# Patient Record
Sex: Female | Born: 2000 | Race: White | Marital: Married
Health system: Southern US, Community
[De-identification: ages and names within clinical notes are randomized; demographics above are authoritative.]

## PROBLEM LIST (undated history)

## (undated) ENCOUNTER — Inpatient Hospital Stay (HOSPITAL_COMMUNITY): Payer: Self-pay

## (undated) DIAGNOSIS — R200 Anesthesia of skin: Secondary | ICD-10-CM

## (undated) DIAGNOSIS — J45909 Unspecified asthma, uncomplicated: Secondary | ICD-10-CM

## (undated) DIAGNOSIS — R202 Paresthesia of skin: Secondary | ICD-10-CM

## (undated) DIAGNOSIS — M357 Hypermobility syndrome: Secondary | ICD-10-CM

## (undated) DIAGNOSIS — M797 Fibromyalgia: Secondary | ICD-10-CM

## (undated) HISTORY — DX: Anesthesia of skin: R20.2

## (undated) HISTORY — DX: Fibromyalgia: M79.7

## (undated) HISTORY — DX: Anesthesia of skin: R20.0

## (undated) HISTORY — DX: Unspecified asthma, uncomplicated: J45.909

## (undated) HISTORY — DX: Hypermobility syndrome: M35.7

## (undated) HISTORY — PX: WISDOM TOOTH EXTRACTION: SHX21

---
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
## 2018-02-10 HISTORY — PX: WISDOM TOOTH EXTRACTION: SHX21

## 2018-03-12 ENCOUNTER — Encounter: Payer: Self-pay | Admitting: Family Medicine

## 2018-03-12 ENCOUNTER — Ambulatory Visit (INDEPENDENT_AMBULATORY_CARE_PROVIDER_SITE_OTHER): Payer: BLUE CROSS/BLUE SHIELD | Admitting: Family Medicine

## 2018-03-12 VITALS — BP 134/82 | HR 104 | Ht 61.0 in | Wt 232.2 lb

## 2018-03-12 DIAGNOSIS — M159 Polyosteoarthritis, unspecified: Secondary | ICD-10-CM | POA: Diagnosis not present

## 2018-03-12 DIAGNOSIS — M797 Fibromyalgia: Secondary | ICD-10-CM | POA: Insufficient documentation

## 2018-03-12 DIAGNOSIS — M62838 Other muscle spasm: Secondary | ICD-10-CM | POA: Insufficient documentation

## 2018-03-12 DIAGNOSIS — M2550 Pain in unspecified joint: Secondary | ICD-10-CM | POA: Insufficient documentation

## 2018-03-12 DIAGNOSIS — Z6841 Body Mass Index (BMI) 40.0 and over, adult: Secondary | ICD-10-CM | POA: Insufficient documentation

## 2018-03-12 MED ORDER — CYCLOBENZAPRINE HCL 10 MG PO TABS: 10.0000 mg | ORAL_TABLET | Freq: Three times a day (TID) | ORAL | 0 refills | Status: DC | PRN

## 2018-03-12 MED ORDER — NAPROXEN 500 MG PO TABS
ORAL_TABLET | ORAL | 0 refills | Status: DC
Start: 2018-03-12 — End: 2018-07-02

## 2018-03-12 NOTE — Progress Notes (Signed)
Pt here for an acute care OV today   Impression and Recommendations:    1. Muscle spasm-upper trap   2. Fibromyalgia   3. Generalized OA   4. BMI 40.0-44.9, adult ([name])     1. Muscle spasm-upper trap -Start flexeril and naproxen PRN.  -use ice cup massage and apply this to the area. Do this 15-20 minutes, 3-4 times a day. 2. Fibromyalgia/generalized OA  -Take OTC meds for pain.  3. BMI 40-44.9 -discussed the importance of walking and exercising regularly. Start off with 5-10 minutes, then slowly increase this over time.  -counseled pt extensively on the importance of regular exercise and prudent diet for improvement to her chronic pain and inflammation in her joints/muscles.  -Reduce intake of processed foods and eat more lean proteins and fresh fruits/veggies.  -Encouraged to follow up in 4 weeks, even if goals are not met.  -track your daily food intake using the Lose It app.   Meds ordered this encounter  Medications  . cyclobenzaprine (FLEXERIL) 10 MG tablet    Sig: Take 1 tablet (10 mg total) by mouth 3 (three) times daily as needed for muscle spasms.    Dispense:  30 tablet    Refill:  0  . naproxen (NAPROSYN) 500 MG tablet    Sig: 1 p.o. up to every 12 hours as needed pain    Dispense:  30 tablet    Refill:  0     Education and routine counseling performed. Handouts provided  Gross side effects, risk and benefits, and alternatives of medications and treatment plan in general discussed with patient.  Patient is aware that all medications have potential side effects and we are unable to predict every side effect or drug-drug interaction that may occur.   Patient will call with any questions prior to using medication if they have concerns.  Expresses verbal understanding and consents to current therapy and treatment regimen.  No barriers to understanding were identified.  Red flag symptoms and signs discussed in detail.  Patient expressed understanding regarding what  to do in case of emergency\urgent symptoms   Please see AVS handed out to patient at the end of our visit for further patient instructions/ counseling done pertaining to today's office visit.   Return in about 1 month (around 04/09/2018).     Note: This document was prepared occasionally using Dragon voice recognition software and may include unintentional dictation errors in addition to a scribe.  This document serves as a record of services personally performed by Mellody Dance, DO. It was created on her behalf by Mayer Masker, a trained medical scribe. The creation of this record is based on the scribe's personal observations and the provider's statements to them.   I have reviewed the above medical documentation for accuracy and completeness and I concur.  Mellody Dance 03/18/18 8:12 AM  --------------------------------------------------------------------------------------------------------------------------------------------------------------------------------------------------------------------------------------------    Subjective:    CC:  Chief Complaint  Patient presents with  . Arm Pain    TINGLING IN LT UPPER ARM    HPI: Nancy Bishop is a 17 y.o. female who presents to [facility] today for issues as discussed below.  She complains of L arm numbness/tingling that began yesterday morning. She has associated loss of strength in her L arm (she states at work she was unable to lift her arm and had to bring things to her arm). This happened after she woke up from sleeping the night before. This lasts  for a few hours and has happened before in her R arm and legs, but today her L arm is still having symptoms. She denies back or neck pain. She further denies any mechanism of injury or trauma to the area.   She reports a recent surgery and not walking as much as a result.     No problems updated.   Wt Readings from Last 3 Encounters:    03/12/18 232 lb 3.2 oz (105.3 kg) (99 %, Z= 2.32)*  05/22/17 222 lb (100.7 kg) (99 %, Z= 2.22)*  10/22/16 218 lb (98.9 kg) (99 %, Z= 2.20)*   * Growth percentiles are based on CDC (Girls, 2-20 Years) data.   BP Readings from Last 3 Encounters:  03/12/18 134/82  05/22/17 118/83  10/22/16 123/82   BMI Readings from Last 3 Encounters:  03/12/18 43.87 kg/m (>99 %, Z= 2.34)*  05/22/17 41.95 kg/m (>99 %, Z= 2.33)*   * Growth percentiles are based on CDC (Girls, 2-20 Years) data.     Patient Care Team    Relationship Specialty Notifications Start End  Mellody Dance, DO PCP - General Family Medicine  05/22/17      Patient Active Problem List   Diagnosis Date Noted  . Fibromyalgia 03/12/2018  . 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 chronic Joint pains 03/12/2018  . BMI 40.0-44.9, adult ([place]) 03/12/2018  . Muscle spasm-upper trap 03/12/2018  . Patellar dislocation, left, initial encounter 10/22/2016  . Patellar subluxation, right, initial encounter 10/22/2016  . Hypermobility syndrome 10/22/2016    Past Medical history, Surgical history, Family history, Social history, Allergies and Medications have been entered into the medical record, reviewed and changed as needed.    No outpatient medications have been marked as taking for the 03/12/18 encounter (Office Visit) with Mellody Dance, DO.    Allergies:  Allergies  Allergen Reactions  . Latex Swelling     Review of Systems: General:   Denies fever, chills, unexplained weight loss.  Optho/Auditory:   Denies visual changes, blurred vision/LOV Respiratory:   Denies wheeze, DOE more than baseline levels.  Cardiovascular:   Denies chest pain, palpitations, new onset peripheral edema  Gastrointestinal:   Denies nausea, vomiting, diarrhea, abd pain.  Genitourinary: Denies dysuria, freq/ urgency, flank pain or discharge from genitals.  Endocrine:     Denies hot or cold intolerance, polyuria, polydipsia. Musculoskeletal:   Denies  unexplained myalgias, joint swelling, unexplained arthralgias, gait problems.  Skin:  Denies new onset rash, suspicious lesions Neurological:     Denies dizziness, unexplained weakness Psychiatric/Behavioral:   Denies mood changes, suicidal or homicidal ideations, hallucinations    Objective:   Blood pressure 134/82, pulse (!) 104, height '5\' 1"'  (1.549 m), weight 232 lb 3.2 oz (105.3 kg), SpO2 100 %, unknown if currently breastfeeding. Body mass index is 43.87 kg/m. General:  Well Developed, well nourished, appropriate for stated age.  Neuro:  Alert and oriented,  extra-ocular muscles intact  HEENT:  Normocephalic, atraumatic, neck supple Skin:  no gross rash, warm, pink. Cardiac:  RRR, S1 S2 Respiratory:  ECTA B/L and A/P, Not using accessory muscles, speaking in full sentences- unlabored. Vascular:  Ext warm, no cyanosis apprec.; cap RF less 2 sec. Psych:  No HI/SI, judgement and insight good, Euthymic mood. Full Affect. Musk: TTP and muscle spasms of left upper trap in the suprascapular region. Reproducing pain and numbness/tingling in her upper arm from axilla to elbow.

## 2018-03-12 NOTE — Patient Instructions (Addendum)
Please try to walk 10-15 minutes daily.  Slowly increase intensity and length of time exercising as you know with fibromyalgia it can easily flareup muscle so I want you to not overdo.  -Please try to log everything using the lose it app. Ill see you in 4 wks to go over health goals-please come even if you have not met the goals we discussed  -For fibromyalgia and chronic joint pains eating a high antioxidant diet which is rich in fruits veggies and lean proteins and avoiding all processed foods is the very best thing you can do for your body.  -Remember to do the ice cup massage on any muscles that are spasming for 15-20 minutes daily 3-4 times a day.

## 2018-03-24 ENCOUNTER — Ambulatory Visit (INDEPENDENT_AMBULATORY_CARE_PROVIDER_SITE_OTHER): Payer: BLUE CROSS/BLUE SHIELD | Admitting: Family Medicine

## 2018-03-24 ENCOUNTER — Encounter: Payer: Self-pay | Admitting: Family Medicine

## 2018-03-24 VITALS — BP 120/84 | HR 110 | Temp 99.1°F | Ht 61.0 in | Wt 232.4 lb

## 2018-03-24 DIAGNOSIS — R0981 Nasal congestion: Secondary | ICD-10-CM

## 2018-03-24 DIAGNOSIS — M797 Fibromyalgia: Secondary | ICD-10-CM | POA: Diagnosis not present

## 2018-03-24 DIAGNOSIS — M6289 Other specified disorders of muscle: Secondary | ICD-10-CM | POA: Insufficient documentation

## 2018-03-24 DIAGNOSIS — J069 Acute upper respiratory infection, unspecified: Secondary | ICD-10-CM | POA: Diagnosis not present

## 2018-03-24 DIAGNOSIS — J029 Acute pharyngitis, unspecified: Secondary | ICD-10-CM | POA: Diagnosis not present

## 2018-03-24 DIAGNOSIS — M2550 Pain in unspecified joint: Secondary | ICD-10-CM | POA: Diagnosis not present

## 2018-03-24 DIAGNOSIS — M791 Myalgia, unspecified site: Secondary | ICD-10-CM | POA: Diagnosis not present

## 2018-03-24 MED ORDER — AMOXICILLIN 875 MG PO TABS: 875.0000 mg | ORAL_TABLET | Freq: Two times a day (BID) | ORAL | 0 refills | Status: DC

## 2018-03-24 MED ORDER — PREDNISONE 20 MG PO TABS: ORAL_TABLET | ORAL | 0 refills | Status: DC

## 2018-03-24 NOTE — Patient Instructions (Signed)
Only uses Naprosyn as needed for acute musculoskeletal pains.  Please stop the Flexeril\cyclobenzaprine since it is not helping you.  -We will send you to a rheumatologist for further evaluation and treatment of your chronic myalgias and arthralgias. -    Symptoms for a upper respiratory tract infection usually last 5-7 days but can stretch out to 2-3 weeks before you're feeling back to normal.  Your symptoms should not worsen after 7-10 days and if they do, please notify our office, as you may need additional evaluation-or at that time start the amoxicillin.  Please do not start the amoxicillin unless you meet the criteria.  You can use over-the-counter afrin nasal spray for up to 3 days (NO longer than that) which will help acutely with nasal drainage/ congestion short term.     Also, sterile saline nasal rinses, such as Lloyd Huger med or AYR sinus rinses, can be very helpful and should be done twice daily- especially throughout the allergy season.   Remember you should use distilled water or previously boiled water to do this.  Then you may use over-the-counter Flonase 1 spray each nostril twice daily after sinus rinses.   You can also use an over the counter cold and flu medication such as Tylenol Severe Cold and Sinus/Flu or Dayquil, Nyquil and the like, which will help with cough, congestion, headache/ pain, fevers/chills etc.  Please note, if you being treated for hypertension or have high blood pressure, you should be using the cold meds designated "HBP".    Wash your hands frequently, as you did not want to get those around you sick as well. Never sneeze or cough on others.  And you should not be going to school or work if you are running a temperature of 100.5 or more on two separate occasions.   Drink plenty of fluids and stay hydrated, especially if you are running fevers.  We don't know why, but chicken soup also helps, try it! :)

## 2018-03-24 NOTE — Progress Notes (Signed)
Acute Care Office visit   Assessment and plan:  1. Upper respiratory tract infection, unspecified type   2. Head congestion   3. Pharyngitis, unspecified etiology   4. Muscular aches   5. Muscular fatigue   6. ? Fibromyalgia   7. Arthralgia, unspecified joint     1. Sore Throat - Viral vs Allergic vs Bacterial causes for pt's symptoms reveiwed.    - Supportive care and various OTC medications discussed in addition to any prescribed.  - Advised patient not to use antibiotic unless symptoms do not improve. - Wait 5-7 days to evaluate whether symptoms are due to allergies or virus. - ONLY begin antibiotic if if symptoms do not improve after 5-7 days and/or worsening at that time.  - Low dose of oral steroids prescribed today due to congestion and post-nasal drip.  2. Other Muscle Pain - Bilateral Upper Extremity - Referral to Rheumatology provided for further evaluation & consult, and rule out other concerns.  - Continue naprosyn OTC for muscular aches and arthralgias, but this is chronic and will be evaluated further through rheumatology.  - Flexeril discontinued.  3. Follow-Up - Call or RTC if new symptoms, or if no improvement or worse over next several days.    - Patient knows to let us know if she develops fevers or one-sided face pain.  Modified Medications   No medications on file    Meds ordered this encounter  Medications  . predniSONE (DELTASONE) 20 MG tablet    Sig: Take 3 pills a day for 2 days, 2 pills a day for 2 days, 1 pill a day for 2 days then one half pill a day for 2 days then off    Dispense:  14 tablet    Refill:  0  . amoxicillin (AMOXIL) 875 MG tablet    Sig: Take 1 tablet (875 mg total) by mouth 2 (two) times daily.    Dispense:  20 tablet    Refill:  0    Orders Placed This Encounter  Procedures  . Ambulatory referral to Rheumatology    Gross side effects, risk and benefits, and alternatives of medications discussed with patient.   Patient is aware that all medications have potential side effects and we are unable to predict every sideeffect or drug-drug interaction that may occur.  Expresses verbal understanding and consents to current therapy plan and treatment regiment.   Education and routine counseling performed. Handouts provided.  Anticipatory guidance and routine counseling done re: condition, txmnt options and need for follow up. All questions of patient's were answered.  Return if symptoms worsen or fail to improve.  Please see AVS handed out to patient at the end of our visit for additional patient instructions/ counseling done pertaining to today's office visit.  Note: This document was partially repared using Dragon voice recognition software and may include unintentional dictation errors.  This document serves as a record of services personally performed by Thomasene Lot, DO. It was created on her behalf by Peggye Fothergill, a trained medical scribe. The creation of this record is based on the scribe's personal observations and the provider's statements to them.   I have reviewed the above medical documentation for accuracy and completeness and I concur.  Thomasene Lot 03/24/18 5:06 PM    Subjective:    Chief Complaint  Patient presents with  . Sinus Problem  . Tingling    RIGHT ARM    HPI:  Pt presents with Sx for  2 days.  Entire family is sick.  Mom is currently on amoxicillin for the same symptoms.   C/o: Onset started with a sore throat, two (2) nights ago.  Sore throat worsened yesterday.  Patient has runny nose, head pressure, and coughs when she feels an itch in her throat.  Denies: Itchy eyes, denies runny eyes.  Denies SOB, wheezing, or chest pain.    For symptoms patient has tried:  Tried Mucinex Sore Throat & Flu, as well as some Lidocaine to soothe her throat.  Did a Neti Pot this morning and took a shower.  Notes feeling better than she was earlier.  Overall  getting:   Symptoms worsening the past day.  Other Muscle Pain Notes muscle pain and tightness in her arms, as well as a "burning sensation" down her arms.  Notes that pain must be due to fibromyalgia.  Was prescribed naproxen and flexeril to alleviate pain, but noted no improvement on the flexeril.  Patient notes that she has had an MRI in the past of "something in the back."  States "it constantly feels like my back is on fire."   Patient Care Team    Relationship Specialty Notifications Start End  Thomasene Lot, DO PCP - General Family Medicine  05/22/17     Past medical history, Surgical history, Family history reviewed and noted below, Social history, Allergies, and Medications have been entered into the medical record, reviewed and changed as needed.   Allergies  Allergen Reactions  . Latex Swelling    Review of Systems: - see above HPI for pertinent positives General:   No F/C, wt loss Pulm:   No DIB, pleuritic chest pain Card:  No CP, palpitations Abd:  No n/v/d or pain Ext:  No inc edema from baseline   Objective:   Blood pressure 120/84, pulse (!) 110, temperature 99.1 F (37.3 C), height  (1.549 m), weight 232 lb 6.4 oz (105.4 kg), SpO2 98 %, unknown if currently breastfeeding. Body mass index is 43.91 kg/m. General: Well Developed, well nourished, appropriate for stated age.  Neuro: Alert and oriented x3, extra-ocular muscles intact, sensation grossly intact.  HEENT: Normocephalic, atraumatic, pupils equal round reactive to light, neck supple, no masses, no lymphadenopathy appreciated.  TM's intact B/L, no acute findings. Nares- patent, clear d/c, OP- clear, mild erythema posterior oropharynx, No TTP sinuses Skin: Warm and dry, no gross rash. Cardiac: RRR, S1 S2,  no murmurs rubs or gallops.  Respiratory: ECTA B/L and A/P, Not using accessory muscles, speaking in full sentences- unlabored. Vascular:  No gross lower ext edema, cap RF less 2 sec. Psych:  No HI/SI, judgement and insight good, Euthymic mood. Full Affect.

## 2018-03-31 ENCOUNTER — Telehealth: Payer: Self-pay | Admitting: Family Medicine

## 2018-03-31 DIAGNOSIS — M2550 Pain in unspecified joint: Secondary | ICD-10-CM

## 2018-03-31 DIAGNOSIS — M7918 Myalgia, other site: Secondary | ICD-10-CM

## 2018-03-31 NOTE — Telephone Encounter (Signed)
Spoke with Nancy Bishop at our front desk and he needs an additional referral to rheumatology for patient to be evaluated for her chronic myalgias and arthralgias which continue to interfere with patient's quality of life.

## 2018-04-01 ENCOUNTER — Encounter: Payer: Self-pay | Admitting: Adult Health

## 2018-04-01 ENCOUNTER — Encounter: Payer: Self-pay | Admitting: Family Medicine

## 2018-04-01 ENCOUNTER — Ambulatory Visit (INDEPENDENT_AMBULATORY_CARE_PROVIDER_SITE_OTHER): Payer: BLUE CROSS/BLUE SHIELD | Admitting: Adult Health

## 2018-04-01 VITALS — BP 119/79 | HR 99 | Temp 98.9°F | Ht 61.0 in | Wt 234.0 lb

## 2018-04-01 DIAGNOSIS — R112 Nausea with vomiting, unspecified: Secondary | ICD-10-CM | POA: Diagnosis not present

## 2018-04-01 MED ORDER — ONDANSETRON 8 MG PO TBDP: 8.0000 mg | ORAL_TABLET | Freq: Three times a day (TID) | ORAL | 0 refills | Status: DC | PRN

## 2018-04-01 NOTE — Patient Instructions (Signed)
Nausea, Adult Feeling sick to your stomach (nausea) means that your stomach is upset or you feel like you have to throw up (vomit). Feeling sick to your stomach is usually not serious, but it may be an early sign of a more serious medical problem. As you feel sicker to your stomach, it can lead to throwing up (vomiting). If you throw up, or if you are not able to drink enough fluids, there is a risk of dehydration. Dehydration can make you feel tired and thirsty, have a dry mouth, and pee (urinate) less often. Older adults and people who have other diseases or a weak defense (immune) system have a higher risk of dehydration. The main goal of treating this condition is to:  Limit how often you feel sick to your stomach.  Prevent throwing up and dehydration.  Follow these instructions at home: Follow instructions from your doctor about how to care for yourself at home. Eating and drinking Follow these recommendations as told by your doctor:  Take an oral rehydration solution (ORS). This is a drink that is sold at pharmacies and stores.  Drink clear fluids in small amounts as you are able, such as: ? Water. ? Ice chips. ? Fruit juice that has water added (diluted fruit juice). ? Low-calorie sports drinks.  Eat bland, easy to digest foods in small amounts as you are able, such as: ? Bananas. ? Applesauce. ? Rice. ? Lean meats. ? Toast. ? Crackers.  Avoid drinking fluids that contain a lot of sugar or caffeine.  Avoid alcohol.  Avoid spicy or fatty foods.  General instructions  Drink enough fluid to keep your pee (urine) clear or pale yellow.  Wash your hands often. If you cannot use soap and water, use hand sanitizer.  Make sure that all people in your household wash their hands well and often.  Rest at home while you get better.  Take over-the-counter and prescription medicines only as told by your doctor.  Breathe slowly and deeply when you feel sick to your  stomach.  Watch your condition for any changes.  Keep all follow-up visits as told by your doctor. This is important. Contact a doctor if:  You have a headache.  You have new symptoms.  You feel sicker to your stomach.  You have a fever.  You feel light-headed or dizzy.  You throw up.  You are not able to keep fluids down. Get help right away if:  You have pain in your chest, neck, arm, or jaw.  You feel very weak or you pass out (faint).  You have throw up that is bright red or looks like coffee grounds.  You have bloody or black poop (stools), or poop that looks like tar.  You have a very bad headache, a stiff neck, or both.  You have very bad pain, cramping, or bloating in your belly.  You have a rash.  You have trouble breathing or you are breathing very quickly.  Your heart is beating very quickly.  Your skin feels cold and clammy.  You feel confused.  You have pain while peeing.  You have signs of dehydration, such as: ? Dark pee, or very little or no pee. ? Cracked lips. ? Dry mouth. ? Sunken eyes. ? Sleepiness. ? Weakness. These symptoms may be an emergency. Do not wait to see if the symptoms will go away. Get medical help right away. Call your local emergency services (911 in the [place] Do not drive yourself to   the hospital. This information is not intended to replace advice given to you by your health care provider. Make sure you discuss any questions you have with your health care provider. Document Released: 10/18/2011 Document Revised: 04/05/2016 Document Reviewed: 07/05/2015 Elsevier Interactive Patient Education  2018 [employer].   [place] Diet A bland diet consists of foods that do not have a lot of fat or fiber. Foods without fat or fiber are easier for the body to digest. They are also less likely to irritate your mouth, throat, stomach, and other parts of your gastrointestinal tract. A bland diet is sometimes called a BRAT diet. What is  my plan? Your health care provider or dietitian may recommend specific changes to your diet to prevent and treat your symptoms, such as:  Eating small meals often.  Cooking food until it is soft enough to chew easily.  Chewing your food well.  Drinking fluids slowly.  Not eating foods that are very spicy, sour, or fatty.  Not eating citrus fruits, such as oranges and grapefruit.  What do I need to know about this diet?  Eat a variety of foods from the bland diet food list.  Do not follow a bland diet longer than you have to.  Ask your health care provider whether you should take vitamins. What foods can I eat? Grains  Hot cereals, such as cream of wheat. Bread, crackers, or tortillas made from refined white flour. Rice. Vegetables Canned or cooked vegetables. Mashed or boiled potatoes. Fruits Bananas. Applesauce. Other types of cooked or canned fruit with the skin and seeds removed, such as canned peaches or pears. Meats and Other Protein Sources Scrambled eggs. Creamy peanut butter or other nut butters. Lean, well-cooked meats, such as chicken or fish. Tofu. Soups or broths. Dairy Low-fat dairy products, such as milk, cottage cheese, or yogurt. Beverages Water. Herbal tea. Apple juice. Sweets and Desserts Pudding. Custard. Fruit gelatin. Ice cream. Fats and Oils Mild salad dressings. Canola or olive oil. The items listed above may not be a complete list of allowed foods or beverages. Contact your dietitian for more options. What foods are not recommended? Foods and ingredients that are often not recommended include:  Spicy foods, such as hot sauce or salsa.  Fried foods.  Sour foods, such as pickled or fermented foods.  Raw vegetables or fruits, especially citrus or berries.  Caffeinated drinks.  Alcohol.  Strongly flavored seasonings or condiments.  The items listed above may not be a complete list of foods and beverages that are not allowed. Contact your  dietitian for more information. This information is not intended to replace advice given to you by your health care provider. Make sure you discuss any questions you have with your health care provider. Document Released: 02/20/2016 Document Revised: 04/05/2016 Document Reviewed: 11/10/2014 Elsevier Interactive Patient Education  2018 [employer].   Sip fluids and follow bland diet. OTC Acetaminophen per manufacturer's instructions to treat fever/aches. Please use Ondansetron (Zofran)  as needed for nausea/vomiting. Work excuse provided- okay to return Friday 04/04/18 If symptoms worsen/persist >1 week, please call clinic. FEEL BETTER!

## 2018-04-01 NOTE — Progress Notes (Signed)
Subjective:    Patient ID: Nancy Bishop, female    DOB: 2000/11/19, 17 y.o.   MRN: [id number]  HPI:  Mr. Gaye Alken presents with nausea/vomting/fever that all started yesterday afternoon. Temp spiked to 100.6 last night, she has not taken anti-pyretics today. She also had three loose stools yesterday, normal bowel habits today. She vomited once yesterday, no emesis today She denies eating anything unusual Sunday/monday- she ate only food prepared at home She denies anyone at home with acute illness. She currently has nausea, poor appetite, fatigue, and "feels really hot". She reports being able to push fluids and denies urinary sx's. She was seen 03/24/18 for pharyngitis, treated with prednisone taper- which she reports only taking two days worth and filled ABX, however did not take any doses. She has not sexual intercourse >1.5 months and has had Nexplanon since Oct 2018  Patient Care Team    Relationship Specialty Notifications Start End  Thomasene Lot, DO PCP - General Family Medicine  05/22/17     Patient Active Problem List   Diagnosis Date Noted  . Non-intractable vomiting with nausea 04/01/2018  . Muscular aches 03/24/2018  . Muscular fatigue 03/24/2018  . Fibromyalgia 03/12/2018  . 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 chronic Joint pains 03/12/2018  . BMI 40.0-44.9, adult (HCC) 03/12/2018  . Muscle spasm-upper trap 03/12/2018  . Patellar dislocation, left, initial encounter 10/22/2016  . Patellar subluxation, right, initial encounter 10/22/2016  . Hypermobility syndrome 10/22/2016     Past Medical History:  Diagnosis Date  . Hypermobility syndrome 10/22/2016     History reviewed. No pertinent surgical history.   History reviewed. No pertinent family history.   Social History   Substance and Sexual Activity  Drug Use No     Social History   Substance and Sexual Activity  Alcohol Use No     Social History   Tobacco Use  Smoking Status Never Smoker   Smokeless Tobacco Never Used     Outpatient Encounter Medications as of 04/01/2018  Medication Sig  . etonogestrel (NEXPLANON) 68 MG IMPL implant Nexplanon 68 mg subdermal implant  Inject 1 implant by subcutaneous route.  . naproxen (NAPROSYN) 500 MG tablet 1 p.o. up to every 12 hours as needed pain  . ondansetron (ZOFRAN-ODT) 8 MG disintegrating tablet Take 1 tablet (8 mg total) by mouth every 8 (eight) hours as needed for nausea or vomiting.  . [DISCONTINUED] amoxicillin (AMOXIL) 875 MG tablet Take 1 tablet (875 mg total) by mouth 2 (two) times daily.  . [DISCONTINUED] predniSONE (DELTASONE) 20 MG tablet Take 3 pills a day for 2 days, 2 pills a day for 2 days, 1 pill a day for 2 days then one half pill a day for 2 days then off   No facility-administered encounter medications on file as of 04/01/2018.     Allergies: Latex  Body mass index is 44.21 kg/m.  Blood pressure 119/79, pulse 99, temperature 98.9 F (37.2 C), temperature source Oral, height  (1.549 m), weight 234 lb (106.1 kg), unknown if currently breastfeeding.  Review of Systems  Constitutional: Positive for activity change, appetite change, fatigue and fever. Negative for chills, diaphoresis and unexpected weight change.  HENT: Negative for congestion, postnasal drip, rhinorrhea, sinus pressure, sinus pain and sore throat.   Eyes: Negative for visual disturbance.  Respiratory: Negative for cough, chest tightness, shortness of breath, wheezing and stridor.   Cardiovascular: Negative for chest pain, palpitations and leg swelling.  Gastrointestinal: Positive for nausea. Negative for abdominal  distention, abdominal pain, blood in stool, constipation, diarrhea and vomiting.  Endocrine: Negative for cold intolerance, heat intolerance, polydipsia, polyphagia and polyuria.  Genitourinary: Negative for difficulty urinating, flank pain and hematuria.  Neurological: Negative for dizziness and headaches.  Hematological: Does  not bruise/bleed easily.       Objective:   Physical Exam  Constitutional: She is oriented to person, place, and time. She appears well-developed and well-nourished. No distress.  Eyes: Pupils are equal, round, and reactive to light. Conjunctivae and EOM are normal.  Cardiovascular: Normal rate, regular rhythm, normal heart sounds and intact distal pulses.  No murmur heard. Pulmonary/Chest: Effort normal and breath sounds normal. No stridor. No respiratory distress. She has no wheezes. She has no rales. She exhibits no tenderness.  Abdominal: Soft. Bowel sounds are normal. She exhibits no distension and no mass. There is no tenderness. There is no rigidity, no rebound, no guarding, no CVA tenderness, no tenderness at McBurney's point and negative Murphy's sign. No hernia.  Neurological: She is alert and oriented to person, place, and time.  Skin: Skin is warm and dry. Capillary refill takes less than 2 seconds. No rash noted. She is not diaphoretic. No erythema. No pallor.  Psychiatric: She has a normal mood and affect. Her behavior is normal. Judgment and thought content normal.  Nursing note and vitals reviewed.     Assessment & Plan:   1. Non-intractable vomiting with nausea, unspecified vomiting type     Non-intractable vomiting with nausea Sip fluids and follow bland diet. OTC Acetaminophen per manufacturer's instructions to treat fever/aches. Please use Ondansetron (Zofran)  as needed for nausea/vomiting. Work excuse provided- okay to return Friday 04/04/18 If symptoms worsen/persist >1 week, please call clinic.    FOLLOW-UP:  Return if symptoms worsen or fail to improve.

## 2018-04-01 NOTE — Assessment & Plan Note (Signed)
Sip fluids and follow bland diet. OTC Acetaminophen per manufacturer's instructions to treat fever/aches. Please use Ondansetron (Zofran)  as needed for nausea/vomiting. Work excuse provided- okay to return Friday 04/04/18 If symptoms worsen/persist >1 week, please call clinic.

## 2018-04-14 ENCOUNTER — Ambulatory Visit: Payer: BLUE CROSS/BLUE SHIELD | Admitting: Family Medicine

## 2018-04-15 NOTE — Progress Notes (Signed)
Office Visit Note  Patient: Nancy Bishop             Date of Birth: Feb 20, 2001           MRN: [id number]             PCP: Thomasene Lot, DO Referring: Thomasene Lot, DO Visit Date: 04/29/2018 Occupation: @GUAROCC @    Subjective:  Pain in multiple joints and muscles.   History of Present Illness: Nancy Bishop is a 17 y.o. female seen in consultation per request of her PCP.  According to patient her symptoms started when she was 11 with the burning sensation in her back, back pain and bilateral knee joint pain.  She also had some right shoulder joint pain at this time.  She was seen by a physician who did MRI of her back in was later diagnosed with fibromyalgia syndrome.  She was given a prescription of muscle relaxers, hydrocodone and Voltaren gel.  She tried it for a month and that she did not see any result she stopped the medications.  She was also seeing an orthopedic surgeon for knee joint pain and had x-rays.  She was diagnosed with patellar subluxation and was sent for physical therapy for 6 months.  She states that physical therapy did not help she was given some braces which she used while she was active.  Gradually she started having generalized arthralgias and myalgias.  She states that she has lived in pain for many years now.  For the last 1-1/2 months she has been experiencing increased pain in her extremities, nocturnal pain and also numbness in her extremities which comes and goes.  She is also noticed hyperalgesia.  She was seen by her PCP recently and was sent for evaluation for arthralgias and myalgias.  She gives history of chronic insomnia at least going on for 1 year now.  Activities of Daily Living:  Patient reports morning stiffness for 24 hours.   Patient Reports nocturnal pain.  Difficulty dressing/grooming: Denies Difficulty climbing stairs: Denies Difficulty getting out of chair: Denies Difficulty using hands for taps, buttons, cutlery, and/or writing:  Denies   Review of Systems  Constitutional: Positive for fatigue. Negative for night sweats, weight gain and weight loss.  HENT: Negative for mouth sores, trouble swallowing, trouble swallowing, mouth dryness and nose dryness.   Eyes: Negative for pain, redness, visual disturbance and dryness.  Respiratory: Negative for cough, shortness of breath and difficulty breathing.   Cardiovascular: Negative for chest pain, palpitations, hypertension, irregular heartbeat and swelling in legs/feet.  Gastrointestinal: Negative for abdominal pain, blood in stool, constipation and diarrhea.  Endocrine: Negative for increased urination.  Genitourinary: Negative for pelvic pain and vaginal dryness.  Musculoskeletal: Positive for arthralgias, joint pain and morning stiffness. Negative for joint swelling, myalgias, muscle weakness, muscle tenderness and myalgias.  Skin: Positive for rash. Negative for color change, hair loss, skin tightness, ulcers and sensitivity to sunlight.  Allergic/Immunologic: Negative for susceptible to infections.  Neurological: Positive for weakness. Negative for dizziness, light-headedness, headaches, memory loss and night sweats.  Hematological: Negative for bruising/bleeding tendency and swollen glands.  Psychiatric/Behavioral: Positive for sleep disturbance. Negative for depressed mood and confusion. The patient is not nervous/anxious.     PMFS History:  Patient Active Problem List   Diagnosis Date Noted  . Non-intractable vomiting with nausea 04/01/2018  . Muscular aches 03/24/2018  . Muscular fatigue 03/24/2018  . Fibromyalgia 03/12/2018  . 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 chronic Joint pains 03/12/2018  .  BMI 40.0-44.9, adult (HCC) 03/12/2018  . Muscle spasm-upper trap 03/12/2018  . Patellar dislocation, left, initial encounter 10/22/2016  . Patellar subluxation, right, initial encounter 10/22/2016  . Hypermobility syndrome 10/22/2016    Past Medical History:  Diagnosis  Date  . Hypermobility syndrome 10/22/2016    Family History  Problem Relation Age of Onset  . Healthy Brother    Past Surgical History:  Procedure Laterality Date  . WISDOM TOOTH EXTRACTION  02/2018   Social History   Social History Narrative  . Not on file     Objective: Vital Signs: BP 129/86 (BP Location: Right Arm, Patient Position: Sitting, Cuff Size: Normal)   Pulse 99   Resp 15   Ht 5' (1.524 m)   Wt 231 lb (104.8 kg)   BMI 45.11 kg/m    Physical Exam  Constitutional: She is oriented to person, place, and time. She appears well-developed and well-nourished.  HENT:  Head: Normocephalic and atraumatic.  Eyes: Conjunctivae and EOM are normal.  Neck: Normal range of motion.  Cardiovascular: Normal rate, regular rhythm, normal heart sounds and intact distal pulses.  Pulmonary/Chest: Effort normal and breath sounds normal.  Abdominal: Soft. Bowel sounds are normal.  Lymphadenopathy:    She has no cervical adenopathy.  Neurological: She is alert and oriented to person, place, and time.  Skin: Skin is warm and dry. Capillary refill takes less than 2 seconds.  Psychiatric: She has a normal mood and affect. Her behavior is normal.  Nursing note and vitals reviewed.    Musculoskeletal Exam: C-spine thoracic lumbar spine good range of motion.  Shoulder joints with good range of motion.  She has hyperextension in bilateral elbows wrist joints MCPs and PIPs.  She is good range of motion of her hip joints.  She has hyperextension of bilateral knee joints with patellar hypermobility.  She has hypermobility in her ankle joints.  No warmth swelling or effusion was noted.  She also had generalized hyperalgesia and few tender points.  CDAI Exam: No CDAI exam completed.    Investigation: No additional findings.    Imaging: Xr Cervical Spine 2 Or 3 Views  Result Date: 04/29/2018 No disc space narrowing was noted.  No facet joint arthropathy was noted. Impression:  Unremarkable x-ray of the C-spine.   Speciality Comments: No specialty comments available.    Procedures:  No procedures performed Allergies: Adhesive [tape] and Latex   Assessment / Plan:     Visit Diagnoses: Polyarthralgia -patient complains of arthralgias in multiple joints for many years.  I do not see any warmth swelling or effusion.  Plan: Sedimentation rate, Rheumatoid factor, Cyclic citrul peptide antibody, IgG, ANA.  A list of natural anti-inflammatories was given.  Neck pain -she has been having some neck stiffness and paresthesias in her bilateral upper extremities.  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  We had detailed discussion regarding association of hypermobility with arthralgias and myalgias.  Joint protection muscle strengthening was discussed.  Isometric exercises and physical therapy will be helpful.  Need for regular exercise including swimming and water aerobics were discussed.  Insomnia-she has long-standing history of insomnia which could be contributing to some of her symptoms.  With sleep hygiene was discussed.  Fibromyalgia-she was diagnosed with fibromyalgia several years ago.  With the history of fatigue, insomnia, generalized pain, hypermobility fibromyalgia diagnosis is quite likely.  She has hyperalgesia positive tender points on examination.  Have advised her to try magnesium malate  for muscle spasms.3  Other fatigue - Plan: CBC with Differential/Platelet, COMPLETE METABOLIC PANEL WITH GFR, CK, TSH, VITAMIN D 25 Hydroxy (Vit-D Deficiency, Fractures)  Subluxation of patellofemoral joint, unspecified laterality, subsequent encounter - Bilateral  Paresthesias -I had detailed discussion with patient and her parents.  They would prefer to have neurology evaluation.  I will refer her to neurology.   Orders: Orders Placed This Encounter  Procedures  . XR Cervical Spine 2 or 3 views  . CBC with  Differential/Platelet  . COMPLETE METABOLIC PANEL WITH GFR  . CK  . TSH  . VITAMIN D 25 Hydroxy (Vit-D Deficiency, Fractures)  . Sedimentation rate  . Rheumatoid factor  . Cyclic citrul peptide antibody, IgG  . ANA   No orders of the defined types were placed in this encounter.   Face-to-face time spent with patient was 50 minutes. >50% of time was spent in counseling and coordination of care.  Follow-Up Instructions: Return for Polyarthralgia.   Pollyann Savoy, MD  Note - This record has been created using Animal nutritionist.  Chart creation errors have been sought, but may not always  have been located. Such creation errors do not reflect on  the standard of medical care.

## 2018-04-29 ENCOUNTER — Other Ambulatory Visit: Payer: Self-pay | Admitting: *Deleted

## 2018-04-29 ENCOUNTER — Encounter (INDEPENDENT_AMBULATORY_CARE_PROVIDER_SITE_OTHER): Payer: Self-pay

## 2018-04-29 ENCOUNTER — Ambulatory Visit: Payer: BLUE CROSS/BLUE SHIELD | Admitting: Rheumatology

## 2018-04-29 ENCOUNTER — Encounter: Payer: Self-pay | Admitting: Rheumatology

## 2018-04-29 ENCOUNTER — Ambulatory Visit (INDEPENDENT_AMBULATORY_CARE_PROVIDER_SITE_OTHER): Payer: Self-pay

## 2018-04-29 VITALS — BP 129/86 | HR 99 | Resp 15 | Ht 60.0 in | Wt 231.0 lb

## 2018-04-29 DIAGNOSIS — S83003D Unspecified subluxation of unspecified patella, subsequent encounter: Secondary | ICD-10-CM | POA: Diagnosis not present

## 2018-04-29 DIAGNOSIS — M2550 Pain in unspecified joint: Secondary | ICD-10-CM

## 2018-04-29 DIAGNOSIS — R202 Paresthesia of skin: Secondary | ICD-10-CM | POA: Diagnosis not present

## 2018-04-29 DIAGNOSIS — M357 Hypermobility syndrome: Secondary | ICD-10-CM

## 2018-04-29 DIAGNOSIS — M797 Fibromyalgia: Secondary | ICD-10-CM | POA: Diagnosis not present

## 2018-04-29 DIAGNOSIS — G4709 Other insomnia: Secondary | ICD-10-CM | POA: Diagnosis not present

## 2018-04-29 DIAGNOSIS — M542 Cervicalgia: Secondary | ICD-10-CM

## 2018-04-29 DIAGNOSIS — R5383 Other fatigue: Secondary | ICD-10-CM | POA: Diagnosis not present

## 2018-04-29 NOTE — Patient Instructions (Signed)
Natural anti-inflammatories  You can purchase these at [employer], [employer] or online.  . Turmeric (capsules)  . Ginger (ginger root or capsules)  . Omega 3 (Fish, flax seeds, chia seeds, walnuts, almonds)  . Tart cherry (dried or extract)   Patient should be under the care of a physician while taking these supplements. This may not be reproduced without the permission of Dr. Pollyann SavoyShaili Venita Seng.   Magnesium malate 250 mg by mouth nightly

## 2018-05-01 LAB — COMPLETE METABOLIC PANEL WITH GFR
AG Ratio: 1.5 (calc) (ref 1.0–2.5)
ALT: 17 U/L (ref 5–32)
AST: 15 U/L (ref 12–32)
Albumin: 4.4 g/dL (ref 3.6–5.1)
Alkaline phosphatase (APISO): 84 U/L (ref 47–176)
BILIRUBIN TOTAL: 0.4 mg/dL (ref 0.2–1.1)
BUN: 7 mg/dL (ref 7–20)
CHLORIDE: 104 mmol/L (ref 98–110)
CO2: 26 mmol/L (ref 20–32)
Calcium: 9.6 mg/dL (ref 8.9–10.4)
Creat: 0.57 mg/dL (ref 0.50–1.00)
GFR, Est African American: 157 mL/min/{1.73_m2} (ref >=60)
GFR, Est Non African American: 135 mL/min/{1.73_m2} (ref >=60)
GLUCOSE: 104 mg/dL — AB (ref 65–99)
Globulin: 2.9 g/dL (calc) (ref 2.0–3.8)
POTASSIUM: 4 mmol/L (ref 3.8–5.1)
Sodium: 138 mmol/L (ref 135–146)
Total Protein: 7.3 g/dL (ref 6.3–8.2)

## 2018-05-01 LAB — CBC WITH DIFFERENTIAL/PLATELET
Basophils Absolute: 78 cells/uL (ref 0–200)
Basophils Relative: 0.7 %
EOS ABS: 246 {cells}/uL (ref 15–500)
EOS PCT: 2.2 %
HCT: 44.7 % (ref 34.0–46.0)
Hemoglobin: 14.9 g/dL (ref 11.5–15.3)
Lymphs Abs: 2542 cells/uL (ref 1200–5200)
MCH: 27.4 pg (ref 25.0–35.0)
MCHC: 33.3 g/dL (ref 31.0–36.0)
MCV: 82.2 fL (ref 78.0–98.0)
MONOS PCT: 8 %
MPV: 10 fL (ref 7.5–12.5)
NEUTROS PCT: 66.4 %
Neutro Abs: 7437 cells/uL (ref 1800–8000)
Platelets: 362 10*3/uL (ref 140–400)
RBC: 5.44 10*6/uL — ABNORMAL HIGH (ref 3.80–5.10)
RDW: 12.6 % (ref 11.0–15.0)
Total Lymphocyte: 22.7 %
WBC mixed population: 896 cells/uL (ref 200–900)
WBC: 11.2 10*3/uL (ref 4.5–13.0)

## 2018-05-01 LAB — VITAMIN D 25 HYDROXY (VIT D DEFICIENCY, FRACTURES): VIT D 25 HYDROXY: 37 ng/mL (ref 30–100)

## 2018-05-01 LAB — RHEUMATOID FACTOR

## 2018-05-01 LAB — ANA: ANA: NEGATIVE

## 2018-05-01 LAB — CK: CK TOTAL: 62 U/L (ref <143)

## 2018-05-01 LAB — SEDIMENTATION RATE: SED RATE: 22 mm/h — AB (ref 0–20)

## 2018-05-01 LAB — TSH: TSH: 4.28 mIU/L

## 2018-05-01 LAB — CYCLIC CITRUL PEPTIDE ANTIBODY, IGG

## 2018-05-01 NOTE — Progress Notes (Signed)
Will discuss at the fu visit

## 2018-05-06 ENCOUNTER — Telehealth: Payer: Self-pay | Admitting: Rheumatology

## 2018-05-06 NOTE — Telephone Encounter (Signed)
Patient called requesting a return call for her lab results.   °

## 2018-05-07 NOTE — Telephone Encounter (Signed)
Patient advised of lab results and verbalized understanding.  

## 2018-06-04 ENCOUNTER — Ambulatory Visit: Payer: Self-pay | Admitting: Rheumatology

## 2018-07-02 ENCOUNTER — Other Ambulatory Visit: Payer: Self-pay

## 2018-07-02 ENCOUNTER — Ambulatory Visit: Payer: BLUE CROSS/BLUE SHIELD | Admitting: Diagnostic Neuroimaging

## 2018-07-02 ENCOUNTER — Telehealth: Payer: Self-pay | Admitting: Diagnostic Neuroimaging

## 2018-07-02 ENCOUNTER — Encounter: Payer: Self-pay | Admitting: Diagnostic Neuroimaging

## 2018-07-02 VITALS — BP 127/90 | HR 112 | Ht 60.0 in | Wt 235.5 lb

## 2018-07-02 DIAGNOSIS — R200 Anesthesia of skin: Secondary | ICD-10-CM

## 2018-07-02 DIAGNOSIS — G890 Central pain syndrome: Secondary | ICD-10-CM

## 2018-07-02 DIAGNOSIS — R0683 Snoring: Secondary | ICD-10-CM

## 2018-07-02 NOTE — Addendum Note (Signed)
Addended by: Tamera StandsHINNANT, Amily Depp D on: 07/02/2018 10:02 AM   Modules accepted: Orders

## 2018-07-02 NOTE — Telephone Encounter (Signed)
[facility]

## 2018-07-02 NOTE — Patient Instructions (Signed)
Thank you for coming to see [place] at [facility]. I hope we have been able to provide you high quality care today.  You may receive a patient satisfaction survey over the next few weeks. We would appreciate your feedback and comments so that we may continue to improve ourselves and the health of our patients.  - check MRI brain and cervical  - check labs   ~~~~~~~~~~~~~~~~~~~~~~~~~~~~~~~~~~~~~~~~~~~~~~~~~~~~~~~~~~~~~~~~~  DR. Aaditya Letizia'S GUIDE TO HAPPY AND HEALTHY LIVING These are some of my general health and wellness recommendations. Some of them may apply to you better than others. Please use common sense as you try these suggestions and feel free to ask me any questions.   ACTIVITY/FITNESS Mental, social, emotional and physical stimulation are very important for brain and body health. Try learning a new activity (arts, music, language, sports, games).  Keep moving your body to the best of your abilities. You can do this at home, inside or outside, the park, community center, gym or anywhere you like. Consider a physical therapist or personal trainer to get started. Fitness trackers, smart-watches or  smart-phones can help as well.   NUTRITION Eat more plants: colorful vegetables, nuts, seeds and berries.  Eat less sugar, salt, preservatives and processed foods.  Avoid toxins such as cigarettes and alcohol.  Drink water when you are thirsty. Warm water with a slice of lemon is an excellent morning drink to start the day.  Consider these websites for more information The Nutrition Source (https://www.henry-hernandez.biz/) [employer] Nutrition (WindowBlog.ch)   RELAXATION Consider practicing mindfulness meditation or other relaxation techniques such as deep breathing, prayer, yoga, tai chi, massage. See website mindful.org or the apps Headspace or Calm to help get started.   SLEEP Try to get at least 7-8+ hours sleep  per day. Regular exercise and reduced caffeine will help you sleep better. Practice good sleep hygeine techniques. See website sleep.org for more information.   PLANNING Prepare estate planning, living will, healthcare POA documents. Sometimes this is best planned with the help of an attorney. Theconversationproject.org and agingwithdignity.org are excellent resources.

## 2018-07-02 NOTE — Progress Notes (Signed)
[facility]  PATIENT: Nancy Bishop DOB: 02/07/2001  REFERRING CLINICIAN: S Deveshwar HISTORY FROM: patient  REASON FOR VISIT: new consult    HISTORICAL  CHIEF COMPLAINT:  Chief Complaint  Patient presents with  . New Patient (Initial Visit)    In room 6 with mom Nancy Bishop Patient here to discuss pain in the arms, feet, back and numbness in the arms. Pateint reports the pain started at 11 and has been constnat. The numbness comes and goes and has started within the last few months.     HISTORY OF PRESENT ILLNESS:   17 year old female here for evaluation of numbness, tingling, pain.  Age 17 years old patient had onset of multifocal pain, mainly along her spine.  Patient developed numbness, pins-and-needles and tingling sensation more recently mainly affecting her arms and legs.  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  Patient moved in with Nancy Bishop recently and has seen PCP and rheumatology, and autoimmune/inflammatory conditions have been ruled out.  Due to intermittent numbness and tingling patient was referred to neurology clinic for further evaluation.  Patient does not have any numbness or tingling from her neck up into her face or head.  She does have blurred vision, snoring, insomnia, restless legs, joint pain, joint swelling cramps aching muscle skin sensitivity and itching.  She does have some anxiety and racing thoughts symptoms.  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   REVIEW OF SYSTEMS: Full 14 system review of systems performed and negative with exception of: As per HPI.  ALLERGIES: Allergies  Allergen Reactions  . Adhesive [Tape]   . Latex Swelling    HOME MEDICATIONS: Outpatient Medications Prior to Visit  Medication Sig Dispense Refill  . etonogestrel (NEXPLANON) 68 MG IMPL implant Nexplanon 68 mg subdermal  implant  Inject 1 implant by subcutaneous route.    . naproxen (NAPROSYN) 500 MG tablet 1 p.o. up to every 12 hours as needed pain (Patient not taking: Reported on 04/29/2018) 30 tablet 0  . ondansetron (ZOFRAN-ODT) 8 MG disintegrating tablet Take 1 tablet (8 mg total) by mouth every 8 (eight) hours as needed for nausea or vomiting. (Patient not taking: Reported on 04/29/2018) 20 tablet 0   No facility-administered medications prior to visit.     PAST MEDICAL HISTORY: Past Medical History:  Diagnosis Date  . Fibromyalgia   . 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 Numbness and tingling     PAST SURGICAL HISTORY: Past Surgical History:  Procedure Laterality Date  . WISDOM TOOTH EXTRACTION  02/2018    FAMILY HISTORY: Family History  Problem Relation Age of Onset  . Other Father   . Healthy Brother   . Cervical cancer Maternal Grandmother   . Alzheimer's disease Paternal Grandfather     SOCIAL HISTORY: Social History   Socioeconomic History  . Marital status: Unknown    Spouse name: Not on file  . Number of children: Not on file  . Years of education: Not on file  . Highest education level: Some college, no degree  Occupational History    Employer: [employer]  Social Needs  . Financial resource strain: Not on file  . Food insecurity:    Worry: Not on file    Inability: Not on file  . Transportation needs:    Medical: Not on file    Non-medical: Not on file  Tobacco Use  . Smoking  status: Never Smoker  . Smokeless tobacco: Never Used  Substance and Sexual Activity  . Alcohol use: No  . Drug use: Never  . Sexual activity: Yes    Birth control/protection: None  Lifestyle  . Physical activity:    Days per week: Not on file    Minutes per session: Not on file  . Stress: Not on file  Relationships  . Social connections:    Talks on phone: Not on file    Gets together: Not on file    Attends religious service: Not on file    Active member of club or organization:  Not on file    Attends meetings of clubs or organizations: Not on file    Relationship status: Not on file  . Intimate partner violence:    Fear of current or ex partner: Not on file    Emotionally abused: Not on file    Physically abused: Not on file    Forced sexual activity: Not on file  Other Topics Concern  . Not on file  Social History Narrative   Right handed    Caffeine- None    Live's at home with mom and dad       PHYSICAL EXAM  GENERAL EXAM/CONSTITUTIONAL: Vitals:  Vitals:   07/02/18 0810  BP: 127/90  Pulse: (!) 112  Weight: 235 lb 8 oz (106.8 kg)  Height: 5' (1.524 m)     Body mass index is 45.99 kg/m. Wt Readings from Last 3 Encounters:  07/02/18 235 lb 8 oz (106.8 kg) (>99 %, Z= 2.36)*  04/29/18 231 lb (104.8 kg) (99 %, Z= 2.31)*  04/01/18 234 lb (106.1 kg) (>99 %, Z= 2.34)*   * Growth percentiles are based on CDC (Girls, 2-20 Years) data.     Patient is in no distress; well developed, nourished and groomed; neck is supple  CARDIOVASCULAR:  Examination of carotid arteries is normal; no carotid bruits  Regular rate and rhythm, no murmurs  Examination of peripheral vascular system by observation and palpation is normal  EYES:  Ophthalmoscopic exam of optic discs and posterior segments is normal; no papilledema or hemorrhages  Visual Acuity Screening   Right eye Left eye Both eyes  Without correction:     With correction: 20/50 20/40      MUSCULOSKELETAL:  Gait, strength, tone, movements noted in Neurologic exam below  NEUROLOGIC: MENTAL STATUS:  No flowsheet data found.  awake, alert, oriented to person, place and time  recent and remote memory intact  normal attention and concentration  language fluent, comprehension intact, naming intact  fund of knowledge appropriate  CRANIAL NERVE:   2nd - no papilledema on fundoscopic exam  2nd, 3rd, 4th, 6th - pupils equal and reactive to light, visual fields full to confrontation,  extraocular muscles intact, no nystagmus  5th - facial sensation symmetric  7th - facial strength symmetric  8th - hearing intact  9th - palate elevates symmetrically, uvula midline  11th - shoulder shrug symmetric  12th - tongue protrusion midline  MOTOR:   normal bulk and tone, full strength in the BUE, BLE  SENSORY:   normal and symmetric to light touch, temperature, vibration  COORDINATION:   finger-nose-finger, fine finger movements normal  REFLEXES:   deep tendon reflexes present and symmetric  GAIT/STATION:   narrow based gait; able to walk on toes, heels and tandem; romberg is negative     DIAGNOSTIC DATA (LABS, IMAGING, TESTING) - I reviewed patient records, labs, notes, testing  and imaging myself where available.  Lab Results  Component Value Date   WBC 11.2 04/29/2018   HGB 14.9 04/29/2018   HCT 44.7 04/29/2018   MCV 82.2 04/29/2018   PLT 362 04/29/2018      Component Value Date/Time   NA 138 04/29/2018 0932   K 4.0 04/29/2018 0932   CL 104 04/29/2018 0932   CO2 26 04/29/2018 0932   GLUCOSE 104 (H) 04/29/2018 0932   BUN 7 04/29/2018 0932   CREATININE 0.57 04/29/2018 0932   CALCIUM 9.6 04/29/2018 0932   PROT 7.3 04/29/2018 0932   AST 15 04/29/2018 0932   ALT 17 04/29/2018 0932   BILITOT 0.4 04/29/2018 0932   GFRNONAA 135 04/29/2018 0932   GFRAA 157 04/29/2018 0932   No results found for: CHOL, HDL, LDLCALC, LDLDIRECT, TRIG, CHOLHDL No results found for: ZOXW9U No results found for: VITAMINB12 Lab Results  Component Value Date   TSH 4.28 04/29/2018   Lab Results  Component Value Date   ANA NEGATIVE 04/29/2018   Lab Results  Component Value Date   ESRSEDRATE 22 (H) 04/29/2018   Cyclic Citrullin Peptide Ab  Date Value Ref Range Status  04/29/2018 <16 UNITS Final    Comment:    Reference Range Negative:            <20 Weak Positive:       20-39 Moderate Positive:   40-59 Strong Positive:     >59 .     04/29/18 Xray  cervical [I reviewed images myself and agree with interpretation. -VRP]  - No disc space narrowing was noted. No facet joint arthropathy was noted. - Impression: Unremarkable x-ray of the C-spine.  10/22/16 xray knees (bilateral) [I reviewed images myself and agree with interpretation. -VRP]  - Slight lateral RIGHT patellar tilt. - Otherwise normal exam.   ASSESSMENT AND PLAN  17 y.o. year old female here with chronic pain, numbness, tingling, insomnia, obesity, hypermobile joints.  We will proceed with further work-up to rule out central nervous system etiologies of symptoms.  Also will check lab testing for metabolic problems as well as sleep study to rule out sleep apnea.  Dx:  1. Numbness   2. Central pain syndrome      PLAN:  - MRI brain and cervical spine (rule out demyelinating disease) - check labs  Orders Placed This Encounter  Procedures  . MR BRAIN W WO CONTRAST  . MR CERVICAL SPINE WO CONTRAST  . Vitamin B12  . TSH  . Hemoglobin A1c  . Ambulatory referral to Sleep Studies   Return pending test results.    Suanne Marker, MD 07/02/2018, 8:45 AM Certified in Neurology, Neurophysiology and Neuroimaging  [facility] [address], [address] [place], [place] [address] [phone]

## 2018-07-02 NOTE — Addendum Note (Signed)
Addended by: Ann MakiBAILEY, MEGAN T on: 07/02/2018 09:55 AM   Modules accepted: Orders

## 2018-07-02 NOTE — Addendum Note (Signed)
Addended by: Ann MakiBAILEY, Derika Eckles T on: 07/02/2018 10:01 AM   Modules accepted: Orders

## 2018-07-03 LAB — HEMOGLOBIN A1C
Est. average glucose Bld gHb Est-mCnc: 117 mg/dL
Hgb A1c MFr Bld: 5.7 % — ABNORMAL HIGH (ref 4.8–5.6)

## 2018-07-03 LAB — VITAMIN B12: Vitamin B-12: 434 pg/mL (ref 232–1245)

## 2018-07-03 NOTE — Telephone Encounter (Signed)
I did call [employer] to check the status on the case and it is still pending for review.

## 2018-07-07 ENCOUNTER — Telehealth: Payer: Self-pay | Admitting: *Deleted

## 2018-07-07 NOTE — Telephone Encounter (Signed)
Spoke to pt and relayed unremarkable lab results.  Her HgbA1c 5.7 (low prediabetic range).  Will forward to pcp.  She verbalized understanding.  Will continue current plan.

## 2018-07-07 NOTE — Telephone Encounter (Signed)
-----   Message from Suanne MarkerVikram R Penumalli, MD sent at 07/04/2018  1:23 PM EDT ----- Unremarkable labs. Continue current plan. Please call patient. -VRP

## 2018-07-07 NOTE — Telephone Encounter (Signed)
spoke to pt she is going to call me back once she talks to her mom about scheduling   [facility] [place] [facility]: [id number] (exp. 07/02/18 to 08/02/18)

## 2018-07-09 NOTE — Telephone Encounter (Signed)
I [facility] to the patient [facility]

## 2018-07-16 ENCOUNTER — Ambulatory Visit: Payer: BLUE CROSS/BLUE SHIELD

## 2018-07-16 DIAGNOSIS — R200 Anesthesia of skin: Secondary | ICD-10-CM

## 2018-07-16 DIAGNOSIS — G890 Central pain syndrome: Secondary | ICD-10-CM | POA: Diagnosis not present

## 2018-07-16 MED ORDER — GADOBENATE DIMEGLUMINE 529 MG/ML IV SOLN
20.0000 mL | Freq: Once | INTRAVENOUS | Status: AC | PRN
  Administered 2018-07-16: 20 mL via INTRAVENOUS

## 2018-07-21 ENCOUNTER — Telehealth: Payer: Self-pay

## 2018-07-21 NOTE — Telephone Encounter (Signed)
Spoke with the patient and she verbalized understanding her results. No questions at this time.  

## 2018-07-21 NOTE — Telephone Encounter (Signed)
-----   Message from Vikram R Penumalli, MD sent at 07/17/2018  4:30 PM EDT ----- Unremarkable imaging results. Please call patient. Continue current plan. -VRP 

## 2018-07-23 ENCOUNTER — Ambulatory Visit: Payer: BLUE CROSS/BLUE SHIELD | Admitting: Family Medicine

## 2018-07-29 ENCOUNTER — Encounter: Payer: Self-pay | Admitting: Family Medicine

## 2018-07-29 ENCOUNTER — Ambulatory Visit (INDEPENDENT_AMBULATORY_CARE_PROVIDER_SITE_OTHER): Payer: BLUE CROSS/BLUE SHIELD | Admitting: Family Medicine

## 2018-07-29 VITALS — BP 124/87 | HR 120 | Ht 60.0 in | Wt 235.0 lb

## 2018-07-29 DIAGNOSIS — M791 Myalgia, unspecified site: Secondary | ICD-10-CM

## 2018-07-29 DIAGNOSIS — M62838 Other muscle spasm: Secondary | ICD-10-CM

## 2018-07-29 DIAGNOSIS — R202 Paresthesia of skin: Secondary | ICD-10-CM

## 2018-07-29 DIAGNOSIS — Z6841 Body Mass Index (BMI) 40.0 and over, adult: Secondary | ICD-10-CM

## 2018-07-29 DIAGNOSIS — R200 Anesthesia of skin: Secondary | ICD-10-CM

## 2018-07-29 DIAGNOSIS — M357 Hypermobility syndrome: Secondary | ICD-10-CM

## 2018-07-29 DIAGNOSIS — M6289 Other specified disorders of muscle: Secondary | ICD-10-CM

## 2018-07-29 DIAGNOSIS — M2550 Pain in unspecified joint: Secondary | ICD-10-CM

## 2018-07-29 NOTE — Patient Instructions (Signed)
Goal for 4 weeks from now is to track everything you put in your mouth using the lose it app.  This is free.  Also please join the pool\gym and do aqua therapy exercises for at least 1 hours 3 days/week to start.  Increase your water intake to at least a gallon a day.  This is extremely important for your joint aches, muscle stiffness etc.  All of the symptoms would be severely exacerbated with phentermine and that would not be a medication I am comfortable starting you on due to your chronic medical issues  In 4 weeks once we get you tracking everything, we can then discuss medications that can augment your dietary and lifestyle modifications you are doing for weight loss    Behavioral Health/ Counseling Referrals    Nancy Bishop, personal counselor in [place]     Nancy Bishop, specializing in marriage counseling    Dr. Marvene Staffarryl Hyers, PHD Dr. Marvene Staffarryl Hyers, PHD is a counselor in [place], [place].  [address] [place], [place] [address] Contact Information [phone]   Nancy Bishop, [place]  [phone] [email or web address] ( she does [place]ristian and faith-based coaching and counseling )    [facility]- ( faith-based counseling ) Address: [address] [place], [place] [address] [phone] Office [address] for appointments [phone] Fax Hours: Monday - Thursday 8:00am-6:00pm Closed for lunch 12-1Thursday only Friday: Closed all day   Nancy Bishop: [phone] or Nancy Bishop- [phone] -counselors in [place] who are faith based    [facility] Hurley CiscoBARBARA Bishop, [place], ACSW, M.ED.  -Hurley CiscoBarbara Bishop is a licensed clinical social worker in practice over 35 years and with Dr. Milagros Evenerupinder Kaur for the last 10 years.  -She sees adults, adolescents, Bishop & families and couples. -Services are provided for mood and anxiety disorders, marital issues, family or parent/child problems, parenting,  co-dependency, gender issues, trauma, grief, and stages of life issues. She also provides critical incident stress debriefing.  -Britta MccreedyBarbara accepts many employee assistance programs (EAP), [employer]commercial insurance, ChiropractorMedicare and Tricare.  PHONE  [phone]                FAX [phone]   Nancy [email or web address] [employer]- gen counseling;  PHD   Nancy Bishop, MSW [address]  [place] [phone]   [facility] Caralyn Guileavid Gutterman, PhD [address], [place] [phone]   Nancy Bishop [address], [address] [place], [place] [phone]   Heloise BeechamAlicia Brown-Licensed Professional Counselor Counseling and [employer] Planning [phone]   [facility] MacKenzie-Substance abuse [facility] Taylor-Clinical Manager [address], [place] [phone]   [facility] [address], [place] [phone] Eliott NineMichie Dew, PhD **   -adult, adolescent, and child Dayton ScrapeStuart Hunt, PhD Hollace Kinnierlair Huprich, LCSW Andrena MewsJennifer Sommer, PhD-child, adolescent and adults   [facility] [address], [place] [phone] Daun PeacockSara DeHart-Young, MS-child, adolescent and adults Madelaine EtienneKatherine Glenn, PhD-adolescent and adults   [facility]-grief, terminal illness [address], [place] [phone]   [facility] [address], [place] [phone] [facility] [address] [place] [phone]   [facility] [address], [place] [phone]   [facility] [address], [address] Marcus[address], [place] [phone]   [facility] [address], [place] [phone]   [facility] [address], [address], [place] [phone]   [facility] [address], [place] [phone]

## 2018-07-30 NOTE — Progress Notes (Signed)
Wt loss OV note   Impression and Recommendations:    1. BMI 45.0-49.9, adult (HCC)   2. Muscular fatigue   3. Muscular aches   4. Muscle spasm-upper trap   5. Hypermobility syndrome   6. Arthralgia, unspecified joint   7. Generalized Numbness and tingling     - Weight Mgt: -Patient is in the pre-contemplative stages of change.  She is not ready to track foods, prepare meals, try to move more etc.  She is largely focused on barriers to change today and we had extensive discussion about this  -Patient declines being referred to nutritional counselor\registered dietitian  -I told patient phentermine would not be an option as this would likely exacerbate many of her symptoms.  Patient rarely drinks any water and has severe myalgias and arthralgias.  This would be grossly worsened by this medication.  We had this extensive discussion and patient was not happy since her mother has used it and lost over 70 pounds on it working with me on her diet and exercise.  -In future we can consider Contrave, QSamia and even injectable such as [place]Saxenda.    -Specific goals given to patient under patient instructions as below:  -Goal for 4 weeks from now is to track everything you put in your mouth using the lose it app.  This is free.  Also please join the pool\gym and do aqua therapy exercises for at least 1 hours 3 days/week to start.  Increase your water intake to at least a gallon a day.  This is extremely important for your joint aches, muscle stiffness etc.  All of the symptoms would be severely exacerbated with phentermine and that would not be a medication I am comfortable starting you on due to your chronic medical issues  In 4 weeks once we get you tracking everything, we can then discuss medications that can augment your dietary and lifestyle modifications you are doing for weight loss  Pt was in the office today for 32.5+ minutes, with over 50% time spent in face to face counseling of  patients morbid obesity and chronic arthralgias, myalgias and neuralgias.  We discussed her multiple specialists treatment plans of those medical conditions including medicine management and lifestyle modification, strategies to improve health and well being; and in coordination of care.  We discussed how I feel her chronic pain, chronic fatigue, chronic myalgias, arthralgias and neuralgias are running her life.  She is defined by these and appears very unmotivated to make changes.     -I recommended she go to a life coach\counselor to discuss these issues with them-which patient declined and said that she did not need that.  Patient denies that she is depressed or upset over this however it is the focus of her life and a huge barrier towards her moving forward and achieving health goals. Also SEE BELOW TREATMENT PLAN FOR DETAILS  Explained to patient what BMI refers to, and what it means medically.    Told patient to think about it as a "medical risk stratification measurement" and how increasing BMI is associated with increasing risk/ or worsening state of various diseases such as hypertension, hyperlipidemia, diabetes, premature OA, depression etc.  American Heart Association guidelines for healthy diet, basically Mediterranean diet, and exercise guidelines of 30 minutes 5 days per week or more discussed in detail.  -Reminded patient the need for yearly complete physical exam office visits in addition to office visits for management of the chronic diseases  Education and routine counseling performed. Handouts provided.   The patient was counseled, risk factors were discussed, anticipatory guidance given.  -Gross side effects, risk and benefits, and alternatives of medications discussed with patient.  Patient is aware that all medications have potential side effects and we are unable to predict every side effect or drug-drug interaction that may occur.  Expresses verbal understanding and consents  to current therapy plan and treatment regimen.   Return for 4 wks- wt loss.  Told patient to bring in her lose it app and track everything for the next 4 weeks.   Please see AVS handed out to patient at the end of our visit for further patient instructions/ counseling done pertaining to today's office visit.    Note: This document was prepared using Dragon voice recognition software and may include unintentional dictation errors.    Nancy Bishop 07/30/18 6:56 AM  ______________________________________________________________________    Subjective:  HPI: Nancy Bishop y.o. female  presents for discussion of her chronic issues.  Patient has recently been seen by neurology as well as rheumatology and wanted to discuss these with me today. -  Also here to discuss possible weight loss.  Morbid obesity: Today patient expresses that she would like to lose weight however she extensively discusses all of her barriers to do that.  She states that the rheumatologist told her she could not do any exercise or movement as there is something seriously wrong with her joints and muscles and part of a "bigger syndrome".   I have asked patient to use the lose it app and/or weight watchers and several other tracking devices to help her and she has told me she has been unmotivated to use it more than a couple days in a row.  She found it annoying.    Also she said that Dr. Tereso Newcomer told her to use a pool to do aqua therapy exercises, however patient has not done so yet and this was approximately 3 to 4 months ago.    She tells me her parents do not eat healthy all the time and she finds it very difficult for her to make good decisions since she eats with them.  However, I take care of her father who is a diabetic who is well controlled and mother who is morbidly obese and has lost over 70 pounds with me within the past 12 months.  Her mother was very successful using the lose it app and tracking her  calories however, patient appears unmotivated to do so today.   1 We referred patient to rheumatology several months ago since patient never had official work-up of her multiple myalgias and arthralgias with chronic fatigue syndrome.  Patient had seen Dr. Tereso Newcomer 04/29/2018 for the first time. Below is her HPI and assessment plan from that OV which pt extensively disccussed with me today.    -HPI:  Pain in multiple joints and muscles.   History of Present Illness: Nancy Bishop is a 17 y.o. female seen in consultation per request of her PCP.  According to patient her symptoms started when she was 11 with the burning sensation in her back, back pain and bilateral knee joint pain.  She also had some right shoulder joint pain at this time.  She was seen by a physician who did MRI of her back in was later diagnosed with fibromyalgia syndrome.  She was given a prescription of muscle relaxers, hydrocodone and Voltaren gel.  She tried it for a month  and that she did not see any result she stopped the medications.  She was also seeing an orthopedic surgeon for knee joint pain and had x-rays.  She was diagnosed with patellar subluxation and was sent for physical therapy for 6 months.  She states that physical therapy did not help she was given some braces which she used while she was active.  Gradually she started having generalized arthralgias and myalgias.  She states that she has lived in pain for many years now.  For the last 1-1/2 months she has been experiencing increased pain in her extremities, nocturnal pain and also numbness in her extremities which comes and goes.  She is also noticed hyperalgesia.  She was seen by her PCP recently and was sent for evaluation for arthralgias and myalgias.  She gives history of chronic insomnia at least going on for 1 year now.  Activities of Daily Living:  Patient reports morning stiffness for 24 hours.   Patient Reports nocturnal pain.  Difficulty  dressing/grooming: Denies Difficulty climbing stairs: Denies Difficulty getting out of chair: Denies Difficulty using hands for taps, buttons, cutlery, and/or writing: Denies   A/P:  Assessment / Plan:     Visit Diagnoses: Polyarthralgia -patient complains of arthralgias in multiple joints for many years.  I do not see any warmth swelling or effusion.  Plan: Sedimentation rate, Rheumatoid factor, Cyclic citrul peptide antibody, IgG, ANA.  A list of natural anti-inflammatories was given.  Neck pain -she has been having some neck stiffness and paresthesias in her bilateral upper extremities.  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  We had detailed discussion regarding association of hypermobility with arthralgias and myalgias.  Joint protection muscle strengthening was discussed.  Isometric exercises and physical therapy will be helpful.  Need for regular exercise including swimming and water aerobics were discussed.  Insomnia-she has long-standing history of insomnia which could be contributing to some of her symptoms.  With sleep hygiene was discussed.  Fibromyalgia-she was diagnosed with fibromyalgia several years ago.  With the history of fatigue, insomnia, generalized pain, hypermobility fibromyalgia diagnosis is quite likely.  She has hyperalgesia positive tender points on examination.  Have advised her to try magnesium malate for muscle spasms.3  Other fatigue - Plan: CBC with Differential/Platelet, COMPLETE METABOLIC PANEL WITH GFR, CK, TSH, VITAMIN D 25 Hydroxy (Vit-D Deficiency, Fractures)  Subluxation of patellofemoral joint, unspecified laterality, subsequent encounter - Bilateral  Paresthesias -I had detailed discussion with patient and her parents.  They would prefer to have neurology evaluation.  I will refer her to neurology.   Orders: Orders Placed This Encounter  Procedures  . XR Cervical Spine 2 or 3 views   . CBC with Differential/Platelet  . COMPLETE METABOLIC PANEL WITH GFR  . CK  . TSH  . VITAMIN D 25 Hydroxy (Vit-D Deficiency, Fractures)  . Sedimentation rate  . Rheumatoid factor  . Cyclic citrul peptide antibody, IgG  . ANA   No orders of the defined types were placed in this encounter.   Face-to-face time spent with patient was 50 minutes. >50% of time was spent in counseling and coordination of care.  Follow-Up Instructions: Return for Polyarthralgia.   Pollyann Savoy, MD   2. Rheumatologist also sent patient to [facility], MD - Neurology for evaluation of her generalized numbness\tingling and chronic pain.  Office visit was on 07/02/2018 HPI and assessment plan as below  HPI:  HISTORY OF PRESENT ILLNESS:  17 year old female here for evaluation of numbness, tingling, pain.  Age 28 years old patient had onset of multifocal pain, mainly along her spine.  Patient developed numbness, pins-and-needles and tingling sensation more recently mainly affecting her arms and legs.  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  Patient moved in with [facility] recently and has seen PCP and rheumatology, and autoimmune/inflammatory conditions have been ruled out.  Due to intermittent numbness and tingling patient was referred to neurology clinic for further evaluation.  Patient does not have any numbness or tingling from her neck up into her face or head.  She does have blurred vision, snoring, insomnia, restless legs, joint pain, joint swelling cramps aching muscle skin sensitivity and itching.  She does have some anxiety and racing thoughts symptoms.  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   A/P: ASSESSMENT AND PLAN  17 y.o. year old female here with chronic pain, numbness, tingling, insomnia, obesity, hypermobile joints.  We will  proceed with further work-up to rule out central nervous system etiologies of symptoms.  Also will check lab testing for metabolic problems as well as sleep study to rule out sleep apnea.  Dx:  1. Numbness   2. Central pain syndrome      PLAN:  - MRI brain and cervical spine (rule out demyelinating disease) - check labs     Orders Placed This Encounter  Procedures  . MR BRAIN W WO CONTRAST  . MR CERVICAL SPINE WO CONTRAST  . Vitamin B12  . TSH  . Hemoglobin A1c  . Ambulatory referral to Sleep Studies   Return pending test results.    Suanne Marker, MD 07/02/2018, 8:45 AM Certified in Neurology, Neurophysiology and Neuroimaging    Weight:  Wt Readings from Last 3 Encounters:  07/29/18 235 lb (106.6 kg) (>99 %, Z= 2.36)*  07/02/18 235 lb 8 oz (106.8 kg) (>99 %, Z= 2.36)*  04/29/18 231 lb (104.8 kg) (99 %, Z= 2.31)*   * Growth percentiles are based on CDC (Girls, 2-20 Years) data.   BMI Readings from Last 3 Encounters:  07/29/18 45.90 kg/m (>99 %, Z= 2.36)*  07/02/18 45.99 kg/m (>99 %, Z= 2.37)*  04/29/18 45.11 kg/m (>99 %, Z= 2.36)*   * Growth percentiles are based on CDC (Girls, 2-20 Years) data.   Lab Results  Component Value Date   HGBA1C 5.7 (H) 07/02/2018    Review of Systems: General:   No F/C, wt loss Pulm:   No DIB, SOB, pleuritic chest pain Card:  No CP, palpitations Abd:  No n/v/d or pain Ext:  No inc edema from baseline   Objective: Physical Exam: BP 124/87   Pulse (!) 120   Ht 5' (1.524 m)   Wt 235 lb (106.6 kg)   SpO2 98%   BMI 45.90 kg/m  Body mass index is 45.9 kg/m. General: Well nourished, in no apparent distress. Eyes: PERRLA, EOMs, conjunctiva clr no swelling or erythema ENT/Mouth: Hearing appears normal.  Mucus Membranes Moist  Neck: Supple, no masses Resp: Respiratory effort- normal, ECTA B/L w/o W/R/R  Cardio: RRR w/o MRGs. Abdomen: no gross distention. Lymphatics:  Brisk peripheral pulses, less 2  sec cap RF, no gross edema  M-sk: Full ROM, 5/5 strength, normal gait.  Skin: Warm, dry without rashes, lesions, ecchymosis.  Neuro: Alert, Oriented Psych: Normal affect, Insight and Judgment appropriate.    Current  Medications:  Current Outpatient Medications on File Prior to Visit  Medication Sig Dispense Refill  . etonogestrel (NEXPLANON) 68 MG IMPL implant Nexplanon 68 mg subdermal implant  Inject 1 implant by subcutaneous route.     No current facility-administered medications on file prior to visit.     Medical History:  Patient Active Problem List   Diagnosis Date Noted  . Generalized Numbness and tingling 07/29/2018  . Non-intractable vomiting with nausea 04/01/2018  . Muscular aches 03/24/2018  . Muscular fatigue 03/24/2018  . Fibromyalgia 03/12/2018  . 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 chronic Joint pains 03/12/2018  . BMI 45.0-49.9, adult (HCC) 03/12/2018  . Muscle spasm-upper trap 03/12/2018  . Patellar dislocation, left, initial encounter 10/22/2016  . Patellar subluxation, right, initial encounter 10/22/2016  . Hypermobility syndrome 10/22/2016    Allergies:  Allergies  Allergen Reactions  . Adhesive [Tape]   . Latex Swelling     Family history-  Reviewed; changed as appropriate  Social history-  Reviewed; changed as appropriate

## 2018-08-06 ENCOUNTER — Ambulatory Visit: Payer: BLUE CROSS/BLUE SHIELD | Admitting: Family Medicine

## 2018-08-19 ENCOUNTER — Ambulatory Visit (INDEPENDENT_AMBULATORY_CARE_PROVIDER_SITE_OTHER): Payer: BLUE CROSS/BLUE SHIELD | Admitting: Adult Health

## 2018-08-19 ENCOUNTER — Encounter: Payer: Self-pay | Admitting: Adult Health

## 2018-08-19 ENCOUNTER — Encounter: Payer: Self-pay | Admitting: Family Medicine

## 2018-08-19 VITALS — BP 119/81 | HR 97 | Temp 98.2°F | Ht 60.0 in | Wt 232.7 lb

## 2018-08-19 DIAGNOSIS — J0110 Acute frontal sinusitis, unspecified: Secondary | ICD-10-CM | POA: Diagnosis not present

## 2018-08-19 MED ORDER — FLUTICASONE PROPIONATE 50 MCG/ACT NA SUSP: 2.0000 | Freq: Every day | NASAL | 6 refills | Status: DC

## 2018-08-19 MED ORDER — AMOXICILLIN-POT CLAVULANATE 875-125 MG PO TABS: 1.0000 | ORAL_TABLET | Freq: Two times a day (BID) | ORAL | 0 refills | Status: DC

## 2018-08-19 NOTE — Patient Instructions (Addendum)

## 2018-08-19 NOTE — Assessment & Plan Note (Signed)
  Please take Augmentin as directed. Please take Flonase as needed. Increase fluids, rest, vit c-2,000mg /day. Alternate OTC Acetaminophen and Ibuprofen as needed for discomfort. If symptoms persist after antibiotic completed, then please call clinic. School excuse provided, okay to return next week.

## 2018-08-19 NOTE — Progress Notes (Signed)
Subjective:    Patient ID: Nancy Bishop, female    DOB: 04-06-2001, 17 y.o.   MRN: [id number]  HPI: Ms. Gaye Alken presents copious nasal drainage, pressure, behind eyes, frontal HA (4/10), L ear pain (3/10), and extreme fatigue that start >1 week ago and has steadily been worsening.  She has been using OTC sudafed, Peppermint Oil, Vick Vapor Rub, Netty Pot, and Claritin. She denies tobacco/vape use She reports her roommate being ill with similar sx's 3 weeks ago She denies CP/fever/night sweats/chills/N/V/D/cough Her roommate is at [facility] during OV  Patient Care Team    Relationship Specialty Notifications Start End  Thomasene Lot, DO PCP - General Family Medicine  05/22/17     Patient Active Problem List   Diagnosis Date Noted  . Acute frontal sinusitis 08/19/2018  . Generalized Numbness and tingling 07/29/2018  . Non-intractable vomiting with nausea 04/01/2018  . Muscular aches 03/24/2018  . Muscular fatigue 03/24/2018  . Fibromyalgia 03/12/2018  . 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 chronic Joint pains 03/12/2018  . BMI 45.0-49.9, adult (HCC) 03/12/2018  . Muscle spasm-upper trap 03/12/2018  . Patellar dislocation, left, initial encounter 10/22/2016  . Patellar subluxation, right, initial encounter 10/22/2016  . Hypermobility syndrome 10/22/2016     Past Medical History:  Diagnosis Date  . Fibromyalgia   . 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 Numbness and tingling      Past Surgical History:  Procedure Laterality Date  . WISDOM TOOTH EXTRACTION  02/2018     Family History  Problem Relation Age of Onset  . Other Father   . Healthy Brother   . Cervical cancer Maternal Grandmother   . Alzheimer's disease Paternal Grandfather      Social History   Substance and Sexual Activity  Drug Use Never     Social History   Substance and Sexual Activity  Alcohol Use No     Social History   Tobacco Use  Smoking Status Never Smoker  Smokeless Tobacco Never Used      Outpatient Encounter Medications as of 08/19/2018  Medication Sig  . etonogestrel (NEXPLANON) 68 MG IMPL implant Nexplanon 68 mg subdermal implant  Inject 1 implant by subcutaneous route.  Marland Kitchen amoxicillin-clavulanate (AUGMENTIN) 875-125 MG tablet Take 1 tablet by mouth 2 (two) times daily.  . fluticasone (FLONASE) 50 MCG/ACT nasal spray Place 2 sprays into both nostrils daily.   No facility-administered encounter medications on file as of 08/19/2018.     Allergies: Adhesive [tape] and Latex  Body mass index is 45.45 kg/m.  Blood pressure 119/81, pulse 97, temperature 98.2 F (36.8 C), temperature source Oral, height 5' (1.524 m), weight 232 lb 11.2 oz (105.6 kg), SpO2 99 %, unknown if currently breastfeeding.  Review of Systems  Constitutional: Positive for activity change, appetite change and fatigue. Negative for chills, diaphoresis, fever and unexpected weight change.  HENT: Positive for congestion, ear pain, facial swelling, postnasal drip, sinus pressure, sneezing and voice change. Negative for ear discharge and sore throat.   Respiratory: Negative for cough, chest tightness, shortness of breath and wheezing.   Cardiovascular: Negative for chest pain, palpitations and leg swelling.  Gastrointestinal: Negative for abdominal distention, abdominal pain, blood in stool, constipation, diarrhea, nausea and vomiting.  Neurological: Positive for headaches. Negative for dizziness.  Hematological: Does not bruise/bleed easily.  Psychiatric/Behavioral: Positive for sleep disturbance.       Objective:   Physical Exam  Constitutional: She is oriented to person, place, and time. She appears well-developed and well-nourished. She appears  ill. No distress.  HENT:  Head: Normocephalic and atraumatic.  Right Ear: External ear normal. Tympanic membrane is bulging. Tympanic membrane is not erythematous. No decreased hearing is noted.  Left Ear: External ear normal. Tympanic membrane is  erythematous and bulging. No decreased hearing is noted.  Nose: Mucosal edema and rhinorrhea present. Right sinus exhibits maxillary sinus tenderness and frontal sinus tenderness. Left sinus exhibits maxillary sinus tenderness and frontal sinus tenderness.  Mouth/Throat: Uvula is midline and mucous membranes are normal. Posterior oropharyngeal edema and posterior oropharyngeal erythema present. No oropharyngeal exudate or tonsillar abscesses. Tonsils are 0 on the right. Tonsils are 0 on the left. No tonsillar exudate.  Eyes: Pupils are equal, round, and reactive to light. Conjunctivae and EOM are normal.  Neck: Normal range of motion. Neck supple.  Cardiovascular: Normal rate, regular rhythm, normal heart sounds and intact distal pulses.  No murmur heard. Pulmonary/Chest: Effort normal and breath sounds normal.  Lymphadenopathy:    She has no cervical adenopathy.  Neurological: She is alert and oriented to person, place, and time.  Skin: Skin is warm and dry. Capillary refill takes less than 2 seconds. No rash noted. She is not diaphoretic. No erythema. No pallor.  Psychiatric: She has a normal mood and affect. Her behavior is normal. Judgment and thought content normal.  Nursing note and vitals reviewed.     Assessment & Plan:   1. Acute frontal sinusitis, recurrence not specified     Acute frontal sinusitis  Please take Augmentin as directed. Please take Flonase as needed. Increase fluids, rest, vit c-2,000mg /day. Alternate OTC Acetaminophen and Ibuprofen as needed for discomfort. If symptoms persist after antibiotic completed, then please call clinic. School excuse provided, okay to return next week.    FOLLOW-UP:  Return if symptoms worsen or fail to improve.

## 2018-09-01 ENCOUNTER — Ambulatory Visit: Payer: BLUE CROSS/BLUE SHIELD | Admitting: Neurology

## 2018-09-01 ENCOUNTER — Encounter: Payer: Self-pay | Admitting: Neurology

## 2018-09-01 ENCOUNTER — Ambulatory Visit (INDEPENDENT_AMBULATORY_CARE_PROVIDER_SITE_OTHER): Payer: BLUE CROSS/BLUE SHIELD | Admitting: Family Medicine

## 2018-09-01 ENCOUNTER — Encounter: Payer: Self-pay | Admitting: Family Medicine

## 2018-09-01 VITALS — BP 125/85 | HR 107 | Ht 61.0 in | Wt 235.0 lb

## 2018-09-01 VITALS — BP 135/86 | HR 118 | Ht 60.0 in | Wt 235.0 lb

## 2018-09-01 DIAGNOSIS — Z6841 Body Mass Index (BMI) 40.0 and over, adult: Secondary | ICD-10-CM

## 2018-09-01 DIAGNOSIS — E669 Obesity, unspecified: Secondary | ICD-10-CM | POA: Diagnosis not present

## 2018-09-01 DIAGNOSIS — G890 Central pain syndrome: Secondary | ICD-10-CM | POA: Diagnosis not present

## 2018-09-01 DIAGNOSIS — R0683 Snoring: Secondary | ICD-10-CM | POA: Diagnosis not present

## 2018-09-01 DIAGNOSIS — R200 Anesthesia of skin: Secondary | ICD-10-CM

## 2018-09-01 MED ORDER — LIRAGLUTIDE -WEIGHT MANAGEMENT 18 MG/3ML ~~LOC~~ SOPN: 0.6000 mg | PEN_INJECTOR | Freq: Every day | SUBCUTANEOUS | 1 refills | Status: DC

## 2018-09-01 NOTE — Patient Instructions (Addendum)
-For meal replacement, consider Muscle Milk light  -Remember that adding additional items like bananas and peanut butter add calories and fat to meal replacements  -Also Nancy Bishop, let me know if you would like to be referred to a bariatric doctor for consultation only.  I think this would be a very good next step even in addition to your current diet and exercise regimen    Behavior Modification Ideas for Weight Management  Weight management involves adopting a healthy lifestyle that includes a knowledge of nutrition and exercise, a positive attitude and the right kind of motivation. Internal motives such as better health, increased energy, self-esteem and personal control increase your chances of lifelong weight management success.  Remember to have realistic goals and think long-term success. Believe in yourself and you can do it. The following information will give you ideas to help you meet your goals.  Control Your Home Environment  Eat only while sitting down at the kitchen or dining room table. Do not eat while watching television, reading, cooking, talking on the phone, standing at the refrigerator or working on the computer. Keep tempting foods out of the house -- don't buy them. Keep tempting foods out of sight. Have low-calorie foods ready to eat. Unless you are preparing a meal, stay out of the kitchen. Have healthy snacks at your disposal, such as small pieces of fruit, vegetables, canned fruit, pretzels, low-fat string cheese and nonfat cottage cheese.  Control Your Work Environment  Do not eat at [employer] or keep tempting snacks at your desk. If you get hungry between meals, plan healthy snacks and bring them with you to work. During your breaks, go for a walk instead of eating. If you work around food, plan in advance the one item you will eat at mealtime. Make it inconvenient to nibble on food by chewing gum, sugarless candy or drinking water or another low-calorie  beverage. Do not work through meals. Skipping meals slows down metabolism and may result in overeating at the next meal. If food is available for special occasions, either pick the healthiest item, nibble on low-fat snacks brought from home, don't have anything offered, choose one option and have a small amount, or have only a beverage.  Control Your Mealtime Environment  Serve your plate of food at the stove or kitchen counter. Do not put the serving dishes on the table. If you do put dishes on the table, remove them immediately when finished eating. Fill half of your plate with vegetables, a quarter with lean protein and a quarter with starch. Use smaller plates, bowls and glasses. A smaller portion will look large when it is in a little dish. Politely refuse second helpings. When fixing your plate, limit portions of food to one scoop/serving or less.   Daily Food Management  Replace eating with another activity that you will not associate with food. Wait 20 minutes before eating something you are craving. Drink a large glass of water or diet soda before eating. Always have a big glass or bottle of water to drink throughout the day. Avoid high-calorie add-ons such as cream with your coffee, butter, mayonnaise and salad dressings.  Shopping: Do not shop when hungry or tired. Shop from a list and avoid buying anything that is not on your list. If you must have tempting foods, buy individual-sized packages and try to find a lower-calorie alternative. Don't taste test in the store. Read food labels. Compare products to help you make the healthiest choices.  Preparation: Chew a piece of gum while cooking meals. Use a quarter teaspoon if you taste test your food. Try to only fix what you are going to eat, leaving yourself no chance for seconds. If you have prepared more food than you need, portion it into individual containers and freeze or refrigerate immediately. Don't snack while  cooking meals.  Eating: Eat slowly. Remember it takes about 20 minutes for your stomach to send a message to your brain that it is full. Don't let fake hunger make you think you need more. The ideal way to eat is to take a bite, put your utensil down, take a sip of water, cut your next bite, take a bit, put your utensil down and so on. Do not cut your food all at one time. Cut only as needed. Take small bites and chew your food well. Stop eating for a minute or two at least once during a meal or snack. Take breaks to reflect and have conversation.  Cleanup and Leftovers: Label leftovers for a specific meal or snack. Freeze or refrigerate individual portions of leftovers. Do not clean up if you are still hungry.  Eating Out and Social Eating  Do not arrive hungry. Eat something light before the meal. Try to fill up on low-calorie foods, such as vegetables and fruit, and eat smaller portions of the high-calorie foods. Eat foods that you like, but choose small portions. If you want seconds, wait at least 20 minutes after you have eaten to see if you are actually hungry or if your eyes are bigger than your stomach. Limit alcoholic beverages. Try a soda water with a twist of lime. Do not skip other meals in the day to save room for the special event.  At Restaurants: Order  la carte rather than buffet style. Order some vegetables or a salad for an appetizer instead of eating bread. If you order a high-calorie dish, share it with someone. Try an after-dinner mint with your coffee. If you do have dessert, share it with two or more people. Don't overeat because you do not want to waste food. Ask for a doggie bag to take extra food home. Tell the server to put half of your entree in a to go bag before the meal is served to you. Ask for salad dressing, gravy or high-fat sauces on the side. Dip the tip of your fork in the dressing before each bite. If bread is served, ask for only one piece. Try  it plain without butter or oil. At [employer] where oil and vinegar is served with bread, use only a small amount of oil and a lot of vinegar for dipping.  At a Friend's House: Offer to bring a dish, appetizer or dessert that is low in calories. Serve yourself small portions or tell the host that you only want a small amount. Stand or sit away from the snack table. Stay away from the kitchen or stay busy if you are near the food. Limit your alcohol intake.  At [employer] and Cafeterias: Cover most of your plate with lettuce and/or vegetables. Use a salad plate instead of a dinner plate. After eating, clear away your dishes before having coffee or tea.  Entertaining at Home: Explore low-fat, low-cholesterol cookbooks. Use single-serving foods like chicken breasts or hamburger patties. Prepare low-calorie appetizers and desserts.   Holidays: Keep tempting foods out of sight. Decorate the house without using food. Have low-calorie beverages and foods on hand for guests. Allow yourself  one planned treat a day. Don't skip meals to save up for the holiday feast. Eat regular, planned meals.   Exercise Well  Make exercise a priority and a planned activity in the day. If possible, walk the entire or part of the distance to work. Get an exercise buddy. Go for a walk with a colleague during one of your breaks, go to the gym, run or take a walk with a friend, walk in the mall with a shopping companion. Park at the end of the parking lot and walk to the store or office entrance. Always take the stairs all of the way or at least part of the way to your floor. If you have a desk job, walk around the office frequently. Do leg lifts while sitting at your desk. Do something outside on the weekends like going for a hike or a bike ride.   Have a Healthy Attitude  Make health your weight management priority. Be realistic. Have a goal to achieve a healthier you, not necessarily the lowest  weight or ideal weight based on calculations or tables. Focus on a healthy eating style, not on dieting. Dieting usually lasts for a short amount of time and rarely produces long-term success. Think long term. You are developing new healthy behaviors to follow next month, in a year and in a decade.    This information is for educational purposes only and is not intended to replace the advice of your doctor or health care provider. We encourage you to discuss with your doctor any questions or concerns you may have.        Guidelines for Losing Weight   We want weight loss that will last so you should lose 1-2 pounds a week.  THAT IS IT! Please pick THREE things a month to change. Once it is a habit check off the item. Then pick another three items off the list to become habits.  If you are already doing a habit on the list GREAT!  Cross that item off!  Dont drink your calories. Ie, alcohol, soda, fruit juice, and sweet tea.   Drink more water. Drink a glass when you feel hungry or before each meal.   Eat breakfast - Complex carb and protein (likeDannon light and fit yogurt, oatmeal, fruit, eggs, [place] bacon).  Measure your cereal.  Eat no more than one cup a day. (ie Kashi)  Eat an apple a day.  Add a vegetable a day.  Try a new vegetable a month.  Use Pam! Stop using oil or butter to cook.  Dont finish your plate or use smaller plates.  Share your dessert.  Eat sugar free Jello for dessert or frozen grapes.  Dont eat 2-3 hours before bed.  Switch to whole wheat bread, pasta, and brown rice.  Make healthier choices when you eat out. No fries!  Pick baked chicken, NOT fried.  Dont forget to SLOW DOWN when you eat. It is not going anywhere.   Take the stairs.  Park far away in the parking lot  Lift soup cans (or weights) for 10 minutes while watching TV.  Walk at work for 10 minutes during break.  Walk outside 1 time a week with your friend, kids, dog, or  significant other.  Start a walking group at church.  Walk the mall as much as you can tolerate.   Keep a food diary.  Weigh yourself daily.  Walk for 15 minutes 3 days per week.  Cook at home  more often and eat out less. If life happens and you go back to old habits, it is okay.  Just start over. You can do it!  If you experience chest pain, get short of breath, or tired during the exercise, please stop immediately and inform your doctor.    Before you even begin to attack a weight-loss plan, it pays to remember this: You are not fat. You have fat. Losing weight isn't about blame or shame; it's simply another achievement to accomplish. Dieting is like any other skill--you have to buckle down and work at it. As long as you act in a smart, reasonable way, you'll ultimately get where you want to be. Here are some weight loss pearls for you.   1. It's Not a Diet. It's a Lifestyle Thinking of a diet as something you're on and suffering through only for the short term doesn't work. To shed weight and keep it off, you need to make permanent changes to the way you eat. It's OK to indulge occasionally, of course, but if you cut calories temporarily and then revert to your old way of eating, you'll gain back the weight quicker than you can say yo-yo. Use it to lose it. Research shows that one of the best predictors of long-term weight loss is how many pounds you drop in the first month. For that reason, nutritionists often suggest being stricter for the first two weeks of your new eating strategy to build momentum. Cut out added sugar and alcohol and avoid unrefined carbs. After that, figure out how you can reincorporate them in a way that's healthy and maintainable.  2. There's a Right Way to Exercise Working out burns calories and fat and boosts your metabolism by building muscle. But those trying to lose weight are notorious for overestimating the number of calories they burn and underestimating  the amount they take in. Unfortunately, your system is biologically programmed to hold on to extra pounds and that means when you start exercising, your body senses the deficit and ramps up its hunger signals. If you're not diligent, you'll eat everything you burn and then some. Use it, to lose it. Cardio gets all the exercise glory, but strength and interval training are the real heroes. They help you build lean muscle, which in turn increases your metabolism and calorie-burning ability 3. Don't Overreact to Mild Hunger Some people have a hard time losing weight because of hunger anxiety. To them, being hungry is bad--something to be avoided at all costs--so they carry snacks with them and eat when they don't need to. Others eat because they're stressed out or bored. While you never want to get to the point of being ravenous (that's when bingeing is likely to happen), a hunger pang, a craving, or the fact that it's 3:00 p.m. should not send you racing for the vending machine or obsessing about the energy bar in your purse. Ideally, you should put off eating until your stomach is growling and it's difficult to concentrate.  Use it to lose it. When you feel the urge to eat, use the HALT method. Ask yourself, Am I really hungry? Or am I angry or anxious, lonely or bored, or tired? If you're still not certain, try the apple test. If you're truly hungry, an apple should seem delicious; if it doesn't, something else is going on. Or you can try drinking water and making yourself busy, if you are still hungry try a healthy snack.  4. Not All Calories  Are Created Equal The mechanics of weight loss are pretty simple: Take in fewer calories than you use for energy. But the kind of food you eat makes all the difference. Processed food that's high in saturated fat and refined starch or sugar can cause inflammation that disrupts the hormone signals that tell your brain you're full. The result: You eat a lot more.  Use it  to lose it. Clean up your diet. Swap in whole, unprocessed foods, including vegetables, lean protein, and healthy fats that will fill you up and give you the biggest nutritional bang for your calorie buck. In a few weeks, as your brain starts receiving regular hunger and fullness signals once again, you'll notice that you feel less hungry overall and naturally start cutting back on the amount you eat.  5. Protein, Produce, and Plant-Based Fats Are Your Weight-Loss Trinity Here's why eating the three Ps regularly will help you drop pounds. Protein fills you up. You need it to build lean muscle, which keeps your metabolism humming so that you can torch more fat. People in a weight-loss program who ate double the recommended daily allowance for protein (about 110 grams for a 150-pound woman) lost 70 percent of their weight from fat, while people who ate the RDA lost only about 40 percent, one study found. Produce is packed with filling fiber. "It's very difficult to consume too many calories if you're eating a lot of vegetables. Example: Three cups of broccoli is a lot of food, yet only 93 calories. (Fruit is another story. It can be easy to overeat and can contain a lot of calories from sugar, so be sure to monitor your intake.) Plant-based fats like olive oil and those in avocados and nuts are healthy and extra satiating.  Use it to lose it. Aim to incorporate each of the three Ps into every meal and snack. People who eat protein throughout the day are able to keep weight off, according to a study in the [place] of Clinical Nutrition. In addition to meat, poultry and seafood, good sources are beans, lentils, eggs, tofu, and yogurt. As for fat, keep portion sizes in check by measuring out salad dressing, oil, and nut butters (shoot for one to two tablespoons). Finally, eat veggies or a little fruit at every meal. People who did that consumed 308 fewer calories but didn't feel any hungrier than when  they didn't eat more produce.  7. How You Eat Is As Important As What You Eat In order for your brain to register that you're full, you need to focus on what you're eating. Sit down whenever you eat, preferably at a table. Turn off the TV or computer, put down your phone, and look at your food. Smell it. Chew slowly, and don't put another bite on your fork until you swallow. When women ate lunch this attentively, they consumed 30 percent less when snacking later than those who listened to an audiobook at lunchtime, according to a study in the [place] of Nutrition. 8. Weighing Yourself Really Works The scale provides the best evidence about whether your efforts are paying off. Seeing the numbers tick up or down or stagnate is motivation to keep going--or to rethink your approach. A 2015 study at [facility] found that daily weigh-ins helped people lose more weight, keep it off, and maintain that loss, even after two years. Use it to lose it. Step on the scale at the same time every day for the best results. If  your weight shoots up several pounds from one weigh-in to the next, don't freak out. Eating a lot of salt the night before or having your period is the likely culprit. The number should return to normal in a day or two. It's a steady climb that you need to do something about. 9. Too Much Stress and Too Little Sleep Are Your Enemies When you're tired and frazzled, your body cranks up the production of cortisol, the stress hormone that can cause carb cravings. Not getting enough sleep also boosts your levels of ghrelin, a hormone associated with hunger, while suppressing leptin, a hormone that signals fullness and satiety. People on a diet who slept only five and a half hours a night for two weeks lost 55 percent less fat and were hungrier than those who slept eight and a half hours, according to a study in the [place]. Use it to lose it. Prioritize sleep,  aiming for seven hours or more a night, which research shows helps lower stress. And make sure you're getting quality zzz's. If a snoring spouse or a fidgety cat wakes you up frequently throughout the night, you may end up getting the equivalent of just four hours of sleep, according to a study from [facility]. Keep pets out of the bedroom, and use a white-noise app to drown out snoring. 10. You Will Hit a plateau--And You Can Bust Through It As you slim down, your body releases much less leptin, the fullness hormone.  If you're not strength training, start right now. Building muscle can raise your metabolism to help you overcome a plateau. To keep your body challenged and burning calories, incorporate new moves and more intense intervals into your workouts or add another sweat session to your weekly routine. Alternatively, cut an extra 100 calories or so a day from your diet. Now that you've lost weight, your body simply doesn't need as much fuel.    Since food equals calories, in order to lose weight you must either eat fewer calories, exercise more to burn off calories with activity, or both. Food that is not used to fuel the body is stored as fat. A major component of losing weight is to make smarter food choices. Here's how:  1)   Limit non-nutritious foods, such as: Sugar, honey, syrups and candy Pastries, donuts, pies, cakes and cookies Soft drinks, sweetened juices and alcoholic beverages  2)  Cut down on high-fat foods by: - Choosing poultry, fish or lean red meat - Choosing low-fat cooking methods, such as baking, broiling, steaming, grilling and boiling - Using low-fat or non-fat dairy products - Using vinaigrette, herbs, lemon or fat-free salad dressings - Avoiding fatty meats, such as bacon, sausage, franks, ribs and luncheon meats - Avoiding high-fat snacks like nuts, chips and chocolate - Avoiding fried foods - Using less butter, margarine, oil and mayonnaise - Avoiding  high-fat gravies, cream sauces and cream-based soups  3) Eat a variety of foods, including: - Fruit and vegetables that are raw, steamed or baked - Whole grains, breads, cereal, rice and pasta - Dairy products, such as low-fat or non-fat milk or yogurt, low-fat cottage cheese and low-fat cheese - Protein-rich foods like chicken, [place], fish, lean meat and legumes, or beans  4) Change your eating habits by: - Eat three balanced meals a day to help control your hunger - Watch portion sizes and eat small servings of a variety of foods - Choose low-calorie snacks - Eat only when  you are hungry and stop when you are satisfied - Eat slowly and try not to perform other tasks while eating - Find other activities to distract you from food, such as walking, taking up a hobby or being involved in the community - Include regular exercise in your daily routine ( minimum of 20 min of moderate-intensity exercise at least 5 days/week)  - Find a support group, if necessary, for emotional support in your weight loss journey           Easy ways to cut 100 calories   1. Eat your eggs with hot sauce OR salsa instead of cheese.  Eggs are great for breakfast, but many people consider eggs and cheese to be BFFs. Instead of cheese--1 oz. of cheddar has 114 calories--top your eggs with hot sauce, which contains no calories and helps with satiety and metabolism. Salsa is also a great option!!  2. Top your toast, waffles or pancakes with fresh berries instead of jelly or syrup. Half a cup of berries--fresh, frozen or thawed--has about 40 calories, compared with 2 tbsp. of maple syrup or jelly, which both have about 100 calories. The berries will also give you a good punch of fiber, which helps keep you full and satisfied and wont spike blood sugar quickly like the jelly or syrup. 3. Swap the non-fat latte for black coffee with a splash of half-and-half. Contrary to its name, that non-fat latte has 130  calories and a startling 19g of carbohydrates per 16 oz. serving. Replacing that light drinkable dessert with a black coffee with a splash of half-and-half saves you more than 100 calories per 16 oz. serving. 4. Sprinkle salads with freeze-dried raspberries instead of dried cranberries. If you want a sweet addition to your nutritious salad, stay away from dried cranberries. They have a whopping 130 calories per  cup and 30g carbohydrates. Instead, sprinkle freeze-dried raspberries guilt-free and save more than 100 calories per  cup serving, adding 3g of belly-filling fiber. 5. Go for mustard in place of mayo on your sandwich. Mustard can add really nice flavor to any sandwich, and there are tons of varieties, from spicy to honey. A serving of mayo is 95 calories, versus 10 calories in a serving of mustard.  Or try an avocado mayo spread: You can find the recipe few click this link: https://www.californiaavocado.com/recipes/recipe-container/[place]-avocado-mayo 6. Choose a DIY salad dressing instead of the store-bought kind. Mix Dijon or whole grain mustard with low-fat Kefir or red wine vinegar and garlic. 7. Use hummus as a spread instead of a dip. Use hummus as a spread on a high-fiber cracker or tortilla with a sandwich and save on calories without sacrificing taste. 8. Pick just one salad accessory. Salad isnt automatically a calorie winner. Its easy to over-accessorize with toppings. Instead of topping your salad with nuts, avocado and cranberries (all three will clock in at 313 calories), just pick one. The next day, choose a different accessory, which will also keep your salad interesting. You dont wear all your jewelry every day, right? 9. Ditch the white pasta in favor of spaghetti squash. One cup of cooked spaghetti squash has about 40 calories, compared with traditional spaghetti, which comes with more than 200. Spaghetti squash is also nutrient-dense. Its a good source of fiber  and Vitamins A and C, and it can be eaten just like you would eat pasta--with a great tomato sauce and [place] meatballs or with pesto, tofu and spinach, for example. 10. Dress up your chili,  soups and stews with non-fat [place] yogurt instead of sour cream. Just a dollop of sour cream can set you back 115 calories and a whopping 12g of fat--seven of which are of the artery-clogging variety. Added bonus: [place] yogurt is packed with muscle-building protein, calcium and B Vitamins. 11. Mash cauliflower instead of mashed potatoes. One cup of traditional mashed potatoes--in all their creamy goodness--has more than 200 calories, compared to mashed cauliflower, which you can typically eat for less than 100 calories per 1 cup serving. Cauliflower is a great source of the antioxidant indole-3-carbinol (I3C), which may help reduce the risk of some cancers, like breast cancer. 12. Ditch the ice cream sundae in favor of a [place] yogurt parfait. Instead of a cup of ice cream or fro-yo for dessert, try 1 cup of nonfat Greek yogurt topped with fresh berries and a sprinkle of cacao nibs. Both toppings are packed with antioxidants, which can help reduce cellular inflammation and oxidative damage. And the comparison is a no-brainer: One cup of ice cream has about 275 calories; one cup of frozen yogurt has about 230; and a cup of Greek yogurt has just 130, plus twice the protein, so youre less likely to return to the freezer for a second helping. 13. Put olive oil in a spray container instead of using it directly from the bottle. Each tablespoon of olive oil is 120 calories and 15g of fat. Use a mister instead of pouring it straight into the pan or onto a salad. This allows for portion control and will save you more than 100 calories. 14. When baking, substitute canned pumpkin for butter or oil. Canned pumpkin--not pumpkin pie mix--is loaded with Vitamin A, which is important for skin and eye health, as well as immunity. And  the comparisons are pretty crazy:  cup of canned pumpkin has about 40 calories, compared to butter or oil, which has more than 800 calories. Yes, 800 calories. Applesauce and mashed banana can also serve as good substitutions for butter or oil, usually in a 1:1 ratio. 15. Top casseroles with high-fiber cereal instead of breadcrumbs. Breadcrumbs are typically made with white bread, while breakfast cereals contain 5-9g of fiber per serving. Not only will you save more than 150 calories per  cup serving, the swap will also keep you more full and youll get a metabolism boost from the added fiber. 16. Snack on pistachios instead of macadamia nuts. Believe it or not, you get the same amount of calories from 35 pistachios (100 calories) as you would from only five macadamia nuts. 17. Chow down on kale chips rather than potato chips. This is my favorite dont knock it till you try it swap. Kale chips are so easy to make at home, and you can spice them up with a little grated parmesan or chili powder. Plus, theyre a mere fraction of the calories of potato chips, but with the same crunch factor we crave so often. 18. Add seltzer and some fruit slices to your cocktail instead of soda or fruit juice. One cup of soda or fruit juice can pack on as much as 140 calories. Instead, use seltzer and fruit slices. The fruit provides valuable phytochemicals, such as flavonoids and anthocyanins, which help to combat cancer and stave off the aging process.

## 2018-09-01 NOTE — Progress Notes (Signed)
Impression and Recommendations:    1. BMI 45.0-49.9, adult (HCC)     -Pt continues to focus on barriers to lifestyle change  BMI  -Begin saxenda- as patient feels strongly that medication is needed to help her lose weight -Explained to pt that medication will not help without dietary changes as well -Discussed with patient adequate calories per day.  Start off with 1200- 1500;  -Encouraged pt to always input her food into the food diary app on her phone, especially when she knows she's not eating well -Explained that under-eating can slow the metabolism, which will slow weight loss -Explained importance of high-protein, low-carb and restricted calorie diets. -Educated pt that it is best to spread out food over the day instead of eating all at once -Explained to pt that her current diet is largely fat and carbs--> NOT GOOD and some items calories in her log appear to be grossly incorrect in terms of the calorie amount etc. ( I.e.: 3 hot dogs are only 150 cal total ) -Explained that diets that work the best are high protein, low carb and restricted calories for weight loss -Discussed the Keto diet -patient does not think she would do well with that -Encouraged pt to switch to chicken, fish and [place] as a way to get protein while decreasing fat  -Explained that beef and pork are a Bishop more calorie dense, and more fat, and so it has to be smaller portion sizes -Discussed plant based protein options  -Suggested ground [place] as an option if she doesn't like [place] -Educated pt that protein shakes are good as meal replacements, but adding additional items can increase the calories in the shake and reverse the positive effects (for instance patient was adding peanut butter, bananas etc. to her protein shakes)  -Explained to pt that she needs to eat around 1200-1500 calories per day -Explained that all calories aren't the same, and eating healthy is important -Discussed medications to  help with weight loss such as contrave, saxenda,etc - she thinks Nancy Bishop is the best; I explained mechanism month how it will make her feel sick if she eats too much and especially if eating too much carbs etc.   Exercise -Explained that exercise can help her lose weight and help relieve some stress -Strongly encouraged pt to start doing water aerobics to help with getting active -Explained how exercise can help with cardiovascular health    Medications Discontinued During This Encounter  Medication Reason  . amoxicillin-clavulanate (AUGMENTIN) 875-125 MG tablet Completed Course  . fluticasone (FLONASE) 50 MCG/ACT nasal spray No longer needed (for PRN medications)     Gross side effects, risk and benefits, and alternatives of medications and treatment plan in general discussed with patient.  Patient is aware that all medications have potential side effects and we are unable to predict every side effect or drug-drug interaction that may occur.   Patient will call with any questions prior to using medication if they have concerns.    Expresses verbal understanding and consents to current therapy and treatment regimen.  No barriers to understanding were identified.  Red flag symptoms and signs discussed in detail.  Patient expressed understanding regarding what to do in case of emergency\urgent symptoms  Please see AVS handed out to patient at the end of our visit for further patient instructions/ counseling done pertaining to today's office visit.   Return in about 4 weeks (around 09/29/2018) for wt loss.     Note:  This note was prepared with assistance of Conservation officer, historic buildings. Occasional wrong-word or sound-a-like substitutions may have occurred due to the inherent limitations of voice recognition software.  This document serves as a record of services personally performed by Nancy Lot, MD. It was created on her behalf by Nancy Bishop, a trained medical scribe. The  creation of this record is based on the scribe's personal observations and the provider's statements to them.   I have reviewed the above medical documentation for accuracy and completeness and I concur.  Nancy Bishop 09/01/18 6:13 PM      --------------------------------------------------------------------------------------------------------------------------------------------------------------------------------------------------------------------------------------------    Subjective:     HPI: Nancy Bishop is a 17 y.o. female who presents to [facility] today for issues as discussed below.  BMI -Per pt's LoseIt app -she's been eating roughly [id number]/day and poorly nutrient foods such as hot dogs, potato chips, fried chicken nuggets, nachos, chicken quesadillas, sausage biscuits etc  -Pt states she hasn't been inputting her food this week because her boyfriend's great grandmother died and her mother was hospitalized due to herniated disk  -States she usually doesn't eat breakfast because she is waking up late around 12pm  -Says she first looks up the brand of food and then tries to find the exact item in the app  -Pt states that she was surprised to see how much fat there is in foods like steak and pork  -Says she doesn't eat that much but because she eats bad food, the calories add up quickly  -Says she doesn't like cooked vegetables so she has problems adding veggies to her diet  -Pt doesn't want to switch to fish because she "only likes [employer] fish"  -States she hates [place] and is unwilling to eat ground [place] or any other substitutes  -Says she only likes chicken and has been eating a Bishop of that, although her log does not show any chicken besides breaded fried chicken products  -Pt states she didn't look into the medicines or read the handouts provided last appointment  -PT STATES:  she has been eating protein shakes with  banana, peanut butter and milk mixed in b/c her brother said that would cause her to lose weight.  (Although I did not see this logged into her lose it app one time)   Exercise -Pt has not been exercising at all  -states "it has just been the last thing on my mind"  -Says she hasn't been working out because she's too busy and is never at home due to classes and other responsibilities     Wt Readings from Last 3 Encounters:  09/01/18 235 lb (106.6 kg) (>99 %, Z= 2.36)*  09/01/18 253 lb (114.8 kg) (>99 %, Z= 2.51)*  08/19/18 232 lb 11.2 oz (105.6 kg) (>99 %, Z= 2.34)*   * Growth percentiles are based on CDC (Girls, 2-20 Years) data.   BP Readings from Last 3 Encounters:  09/01/18 125/85  09/01/18 135/86  08/19/18 119/81   Pulse Readings from Last 3 Encounters:  09/01/18 (!) 107  09/01/18 (!) 118  08/19/18 97   BMI Readings from Last 3 Encounters:  09/01/18 44.40 kg/m (99 %, Z= 2.32)*  09/01/18 49.41 kg/m (>99 %, Z= 2.43)*  08/19/18 45.45 kg/m (>99 %, Z= 2.35)*   * Growth percentiles are based on CDC (Girls, 2-20 Years) data.     Patient Care Team    Relationship Specialty Notifications Start End  Nancy Lot, DO PCP -  General Family Medicine  05/22/17      Patient Active Problem List   Diagnosis Date Noted  . Acute frontal sinusitis 08/19/2018  . Generalized Numbness and tingling 07/29/2018  . Non-intractable vomiting with nausea 04/01/2018  . Muscular aches 03/24/2018  . Muscular fatigue 03/24/2018  . Fibromyalgia 03/12/2018  . 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 chronic Joint pains 03/12/2018  . BMI 45.0-49.9, adult (HCC) 03/12/2018  . Muscle spasm-upper trap 03/12/2018  . Patellar dislocation, left, initial encounter 10/22/2016  . Patellar subluxation, right, initial encounter 10/22/2016  . Hypermobility syndrome 10/22/2016    Past Medical history, Surgical history, Family history, Social history, Allergies and Medications have been entered into the  medical record, reviewed and changed as needed.    No outpatient medications have been marked as taking for the 09/01/18 encounter (Office Visit) with Nancy Lot, DO.    Allergies:  Allergies  Allergen Reactions  . Adhesive [Tape]   . Latex Swelling     Review of Systems:  A fourteen system review of systems was performed and found to be positive as per HPI.   Objective:   Blood pressure 135/86, pulse (!) 118, height 5' (1.524 m), weight 253 lb (114.8 kg), SpO2 99 %, unknown if currently breastfeeding. Body mass index is 49.41 kg/m. General:  Well Developed, well nourished, appropriate for stated age.  Neuro:  Alert and oriented,  extra-ocular muscles intact  HEENT:  Normocephalic, atraumatic, neck supple, no carotid bruits appreciated  Skin:  no gross rash, warm, pink. Cardiac:  RRR, S1 S2 Respiratory:  ECTA B/L and A/P, Not using accessory muscles, speaking in full sentences- unlabored. Vascular:  Ext warm, no cyanosis apprec.; cap RF less 2 sec. Psych:  No HI/SI, judgement and insight good, Euthymic mood. Full Affect.

## 2018-09-01 NOTE — Patient Instructions (Addendum)

## 2018-09-01 NOTE — Progress Notes (Signed)
SLEEP MEDICINE CLINIC   Provider:  Melvyn Novas, M.D.   Primary Care Physician:  Thomasene Lot, DO  Referring Provider: Joycelyn Schmid, MD   Chief Complaint  Patient presents with  . New Patient (Initial Visit)    pt with a friend, rm 10. pt states that she has difficulty with initiating sleep and staying asleep, which she thinks is in relation to her pain. pt has been told she snores in sleep. she is a light sleeper, wakes frequently . denies having completed a sleep study in the past    HPI:  Nancy Bishop is a 17 y.o. female patient, seen here on [id number]  as in a referral from Dr. Marjory Lies - 2 The patient has been diagnosed with fibromyalgia and hypermobility by Dr Corliss Skains in the last 6 month. She had been followed in [place] for these symptoms since age 89.  The patient just saw her primary physician, who prescribed a weight loss medication for her.  She was seen by Dr. Marjory Lies on 02 July 2018 for numbness, tingling and pain.  Her body mass index at the time was 46.  He mentioned stable gait strength tone and movement.  He is concerned about her high degree of fatigue in association with known tendency to snore feeling hot, having joint pain cramps and aching muscles at night and often waking up not feeling restored and refreshed.  Apnea has not been witnessed.  The patient recently underwent an MRI of the brain with and without contrast, cervical spine without contrast thyroid hemoglobin A1c and Vitamin B12. She uses blue light lenses/ glasses.     Chief complaint according to patient : trouble to fall asleep and stay asleep.   Sleep habits are as follows: dinner time is 5-6 pm, and  Goes to bed, does word searches in bed. She is always staying in bed, in the dark. She gets off the phone by 11 pm, and likes to sleep after that. She sleeps prone, she sleeps on  Flat bed, on one pillow. She reports GERD, but rarely at night. Rarely dreams. The patient's bedroom is  cool, quiet and dark.  She often feels that she is wide-awake and may need an hour to go to sleep, once asleep she will wake up within 3 hours-  2-3 times at night. She feels alert and wake. Feels not tired when she rises. She rises at 7 AM.  No morning headaches. No naps. No dry mouth.   Sleep medical history and family sleep history: MGM has OSA , is on CPAP.  Sleep walking history. Had bells attached to the door- age 39-8.   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 Non smoker, non vapor. No ETOH use, caffeine : none     Review of Systems: Out of a complete 14 system review, the patient complains of only the following symptoms, and all other reviewed systems are negative.  Epworth sleepiness score endorsed at 6 points, fatigue severity scale at 46 points.    Social History   Socioeconomic History  . Marital status: Unknown    Spouse name: Not on file  . Number of children: Not on file  . Years of education: Not on file  . Highest education level: Some college, no degree  Occupational History    Employer: [employer]  Social Needs  . Financial resource strain: Not on file  . Food insecurity:    Worry: Not on file    Inability: Not on  file  . Transportation needs:    Medical: Not on file    Non-medical: Not on file  Tobacco Use  . Smoking status: Never Smoker  . Smokeless tobacco: Never Used  Substance and Sexual Activity  . Alcohol use: No  . Drug use: Never  . Sexual activity: Yes    Birth control/protection: None  Lifestyle  . Physical activity:    Days per week: Not on file    Minutes per session: Not on file  . Stress: Not on file  Relationships  . Social connections:    Talks on phone: Not on file    Gets together: Not on file    Attends religious service: Not on file    Active member of club or organization: Not on file    Attends meetings of clubs or organizations: Not on file    Relationship status: Not on file  . Intimate partner  violence:    Fear of current or ex partner: Not on file    Emotionally abused: Not on file    Physically abused: Not on file    Forced sexual activity: Not on file  Other Topics Concern  . Not on file  Social History Narrative   Right handed    Caffeine- None    Live's at home with mom and dad      Family History  Problem Relation Age of Onset  . Other Father   . Healthy Brother   . Cervical cancer Maternal Grandmother   . Alzheimer's disease Paternal Grandfather     Past Medical History:  Diagnosis Date  . Fibromyalgia   . 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 Numbness and tingling     Past Surgical History:  Procedure Laterality Date  . WISDOM TOOTH EXTRACTION  02/2018    Current Outpatient Medications  Medication Sig Dispense Refill  . etonogestrel (NEXPLANON) 68 MG IMPL implant Nexplanon 68 mg subdermal implant  Inject 1 implant by subcutaneous route.    . Liraglutide -Weight Management (SAXENDA) 18 MG/3ML SOPN Inject 0.6 mg into the skin daily. 0.6 mg injected subcutaneously daily for 1 week, then increase by 0.6 mg weekly until reaching 3 mg injected subcutaneous daily (Patient not taking: Reported on 09/01/2018) 6 pen 1   No current facility-administered medications for this visit.     Allergies as of 09/01/2018 - Review Complete 09/01/2018  Allergen Reaction Noted  . Adhesive [tape]  04/29/2018  . Latex Swelling 12/20/2015    Vitals: BP 125/85   Pulse (!) 107   Ht 5\' 1"  (1.549 m)   Wt 235 lb (106.6 kg)   BMI 44.40 kg/m  Last Weight:  Wt Readings from Last 1 Encounters:  09/01/18 235 lb (106.6 kg) (>99 %, Z= 2.36)*   * Growth percentiles are based on CDC (Girls, 2-20 Years) data.   ZOX:WRUE mass index is 44.4 kg/m.     Last Height:   Ht Readings from Last 1 Encounters:  09/01/18 5\' 1"  (1.549 m) (10 %, Z= -1.29)*   * Growth percentiles are based on CDC (Girls, 2-20 Years) data.    Physical exam:  General: The patient is awake, alert and  appears not in acute distress. The patient is well groomed. Head: Normocephalic, atraumatic. Neck is supple. Mallampati  4,  neck circumference:16. Nasal airflow unrestricted , Retrognathia is seen.  Cardiovascular:  Regular rate and rhythm , without  murmurs or carotid bruit, and without distended neck veins. Respiratory: Lungs are clear to auscultation. Skin:  Without evidence of edema, or rash Trunk: BMI is 46. The patient's posture is erect  Neurologic exam : The patient is awake and alert, oriented to place and time.     Attention span & concentration ability appears normal.  Speech is fluent,  without  dysarthria, dysphonia or aphasia.  Mood and affect are appropriate.  Cranial nerves: Pupils are equal and briskly reactive to light. Funduscopic exam without evidence of pallor or edema. Extraocular movements  in vertical and horizontal planes intact and without nystagmus. Visual fields by finger perimetry are intact. Hearing to finger rub intact.  Facial sensation intact to fine touch. Facial motor strength is symmetric and tongue and uvula move midline. Shoulder shrug was symmetrical.   Motor exam:   Normal tone, muscle bulk and symmetric strength in all extremities. Sensory:  Fine touch, pinprick and vibration were tested in all extremities. Proprioception tested in the upper extremities was normal. Coordination: Rapid alternating movements in the fingers/hands was normal. Finger-to-nose maneuver  normal without evidence of ataxia, dysmetria or tremor. Gait and station: Patient walks without assistive device and is able unassisted to climb up to the exam table. Strength within normal limits.  Stance is stable and normal.  Deep tendon reflexes: in the upper and lower extremities are symmetric and intact.     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   2) snoring   3) obesity    The patient was advised of the nature of the diagnosed disorder , the treatment options and the risks for general health and wellness arising from not treating the condition.   I spent more than  40 minutes of face to face time with the patient.  Greater than 50% of time was spent in counseling and coordination of care. We have discussed the diagnosis and differential and I answered the patient's questions.    Plan:  Treatment plan and additional workup :  SPLIT night study versus HST- will see what insurance allows.    Melvyn Novas, MD 09/01/2018, 3:08 PM  Certified in Neurology by ABPN Certified in Sleep Medicine by [facility] [address], [address] [place], [place] [address]

## 2018-09-12 ENCOUNTER — Telehealth: Payer: Self-pay | Admitting: Family Medicine

## 2018-09-12 NOTE — Telephone Encounter (Signed)
Patient's parents called stating that the Saxenda injections are not going to work because of patient's fear of needles, they are requesting something oral. Please advise and if approved send to [facility]

## 2018-09-15 ENCOUNTER — Telehealth: Payer: Self-pay | Admitting: Family Medicine

## 2018-09-15 NOTE — Telephone Encounter (Signed)
Please see pervious note. MPulliam, CMA/RT(R)  

## 2018-09-15 NOTE — Telephone Encounter (Signed)
I recommend metformin start at 500 twice daily.  She will need a BMP in about 3 months.  This will help with her appetite as well as possibly give her side effects with high carbohydrate rich meals or when eating too much.  Again please let her know that due to her blood pressure and other factors, I do not recommend phentermine.

## 2018-09-15 NOTE — Telephone Encounter (Signed)
LOV 09/01/2018,  Please review and advise. MPulliam, CMA/RT(R)

## 2018-09-15 NOTE — Telephone Encounter (Signed)
Father called to f/u on prior message about new weight loss med, patient is having hard time giving herself injection with Saxenda and is hoping for something else to be called into [facility]. Please advise

## 2018-09-16 ENCOUNTER — Ambulatory Visit (INDEPENDENT_AMBULATORY_CARE_PROVIDER_SITE_OTHER): Payer: BLUE CROSS/BLUE SHIELD | Admitting: Neurology

## 2018-09-16 DIAGNOSIS — G4710 Hypersomnia, unspecified: Secondary | ICD-10-CM | POA: Diagnosis not present

## 2018-09-16 DIAGNOSIS — Z6841 Body Mass Index (BMI) 40.0 and over, adult: Secondary | ICD-10-CM

## 2018-09-16 DIAGNOSIS — R200 Anesthesia of skin: Secondary | ICD-10-CM

## 2018-09-16 DIAGNOSIS — G890 Central pain syndrome: Secondary | ICD-10-CM

## 2018-09-16 DIAGNOSIS — R0683 Snoring: Secondary | ICD-10-CM

## 2018-09-16 MED ORDER — METFORMIN HCL 500 MG PO TABS: 500.0000 mg | ORAL_TABLET | Freq: Two times a day (BID) | ORAL | 0 refills | Status: DC

## 2018-09-16 NOTE — Telephone Encounter (Signed)
Sent medication in per Dr. Synthia Innocent notes.  Patient notified. MPulliam, CMA/RT(R)

## 2018-09-16 NOTE — Addendum Note (Signed)
Addended by: Leda Min D on: 09/16/2018 05:04 PM   Modules accepted: Orders

## 2018-09-16 NOTE — Telephone Encounter (Signed)
Called unable to reach the patient or leave a message. MPulliam, CMA/RT(R)

## 2018-09-19 DIAGNOSIS — R0683 Snoring: Secondary | ICD-10-CM | POA: Insufficient documentation

## 2018-09-19 NOTE — Procedures (Signed)
PATIENT'S NAME:  Nancy Bishop, Nancy Bishop DOB:      [birth year]      MR#:    [id number]     DATE OF RECORDING: 09/16/2018 REFERRING M.D.:  Thomasene Lot, DO/ Joycelyn Schmid, MD Study Performed:   Baseline Polysomnogram HISTORY:   Nancy Bishop is a 17 y.o. female patient, seen on 10-2-12019 in a referral from Dr. Marjory Lies - The patient has been diagnosed with fibromyalgia and hypermobility by Dr. Corliss Skains in the last 6 month. She had been followed in [place] for the symptoms since age 66. The patient just saw her primary physician, who prescribed a weight loss medication for her.  She was seen by Dr. Marjory Lies on 02 July 2018 for numbness, tingling and pain.  Her body mass index at the time was 46.  He mentioned stable gait, strength, normal tone and movement.  He is concerned about her high degree of fatigue in association with known tendency to snore, feeling hot, having joint pain and cramps/ aching muscles at night. She reports waking up not feeling restored and refreshed.  Apnea has not been witnessed.   Sleep walking history in childhood. Had bells attached to the door between age 9 and 6 years.  The patient endorsed the Epworth Sleepiness Scale at 6 points, FSS at 46/63 points.   The patient's weight 236 pounds with a height of 61 (inches), resulting in a BMI of 44.5 kg/m2. The patient's neck circumference measured 16 inches.  CURRENT MEDICATIONS: Nexplanon, Saxenda   PROCEDURE:  This is a multichannel digital polysomnogram utilizing the Somnostar 11.2 system.  Electrodes and sensors were applied and monitored per AASM Specifications.   EEG, EOG, Chin and Limb EMG, were sampled at 200 Hz.  ECG, Snore and Nasal Pressure, Thermal Airflow, Respiratory Effort, CPAP Flow and Pressure, Oximetry was sampled at 50 Hz. Digital video and audio were recorded.      BASELINE STUDY: Lights Out was at 22:42 and Lights On at 05:02.  Total recording time (TRT) was 381 minutes, with a total sleep time (TST)  of 328 minutes.   The patient's sleep latency was 22 minutes.  REM latency was 197.5 minutes.  The sleep efficiency was 86.1 %.     SLEEP ARCHITECTURE: WASO (Wake after sleep onset) was 31 minutes.  There were 24 minutes in Stage N1, 70 minutes Stage N2, 214.5 minutes Stage N3 and 19.5 minutes in Stage REM.  The percentage of Stage N1 was 7.3%, Stage N2 was 21.3%, Stage N3 was 65.4% and Stage R (REM sleep) was 5.9%.  The arousals were noted as: 72 were spontaneous, 0 were associated with PLMs, and 0 were associated with respiratory events.  RESPIRATORY ANALYSIS:  There were a total of 0 respiratory events:  0 obstructive apneas, 0 central apneas and 0 mixed apneas with a total of 0 apneas and an apnea index (AI) of 0 /hour. There were 0 hypopneas with a hypopnea index of 0 /hour.  The patient snored loudly in lateral and prone sleep- but had few respiratory event related arousals (RERAs).     The total APNEA/HYPOPNEA INDEX (AHI) was 0 /hour and the total RESPIRATORY DISTURBANCE INDEX was 5 /hour.  0 events occurred in REM sleep and 0 events in NREM. The REM AHI was 0 /hour, versus a non-REM AHI of 0. The patient spent 0 minutes of total sleep time in the supine position and 328 minutes in non-supine. The supine AHI was 0.0 versus a non-supine AHI of 0.0.  OXYGEN  SATURATION & C02:  The Wake baseline 02 saturation was 97%, with the lowest being 94%. Time spent below 89% saturation equaled 0 minutes.    PERIODIC LIMB MOVEMENTS:   The patient had a total of 0 Periodic Limb Movements.  The arousals were noted as: 72 were spontaneous, 0 were associated with PLMs, and 0 were associated with respiratory events. Audio and video analysis did not show any abnormal or unusual movements, behaviors, phonations or vocalizations.   The patient took one bathroom break. Loud Snoring was noted- even in prone position. EKG with irregular heart rate - see screen shot. Mostly normal sinus rhythm (NSR). Post-study, the  patient indicated that sleep was the same as usual.    IMPRESSION:  1. Primary Snoring- loud. 2. Non-specific abnormal EKG. Screenshot attached.  3. Many spontaneous arousals, unrelated to measurable functions of physiology.    RECOMMENDATIONS: Unexpectedly, no physiological sleep abnormality was noted.  No follow up is needed. Snoring can be treated by weight loss, a dental device, but not by changes to the sleep position.     I certify that I have reviewed the entire raw data recording prior to the issuance of this report in accordance with the Standards of Accreditation of the American Academy of Sleep Medicine (AASM)    Melvyn Novas, MD     09-19-2018  Diplomat, American Board of Psychiatry and Neurology  Diplomat, American Board of Sleep Medicine Medical Director, [facility] [employer]

## 2018-09-22 ENCOUNTER — Telehealth: Payer: Self-pay | Admitting: Neurology

## 2018-09-22 NOTE — Telephone Encounter (Signed)
Apt scheudled ?

## 2018-09-22 NOTE — Telephone Encounter (Signed)
Patient called back and stated that she does want the December 3rd at 9:30 apt. Please remove hold and schedule. No need to return call.

## 2018-09-22 NOTE — Telephone Encounter (Signed)
Called the patient to make her aware the sleep study was read and Dr Dohmeier didn't find any physiological sleep abnormality noted except loud snoring present. No apnea, hypoxemia or PLM's. Informed her there would be no need to follow up with sleep clinic. I asked if the patient had a follow up apt scheduled with Dr Marjory Lies. Patient didn't, She wanted to schedule a follow up to discuss next steps and MRI finding questions. At this time the only opening I saw was a dec 3, next available isn't until April. Attempted to offer a NP opening since MRI was normal but the pt requested MD. Pt is going to call her mother and will call right back. Placed a dec 3 9:30 am on hold for the moment and informed the patient can not hold slots for long period of time. Pt verbalized understanding.

## 2018-10-02 ENCOUNTER — Ambulatory Visit: Payer: BLUE CROSS/BLUE SHIELD | Admitting: Family Medicine

## 2018-10-02 ENCOUNTER — Telehealth: Payer: Self-pay | Admitting: Family Medicine

## 2018-10-02 NOTE — Telephone Encounter (Signed)
Called the patient and left message for patient to call the office. MPulliam, CMA/RT(R)

## 2018-10-02 NOTE — Telephone Encounter (Signed)
Pt called states still has Sinus infection symptoms & request provider call in Rx or advise is OV is required.  --Forwarding message to medical assistant.  --Fausto Skillernglh

## 2018-10-03 NOTE — Telephone Encounter (Signed)
Spoke to patient she is complaining of sinus pressure, congestion, sore throat, and post nasal drainage x 4-5 days.  Patient denies cough or fevers.  Patient is currently using Flonase and OTC medication for sinus congestion. Patient would like to know if there is anything else she should do and if she needs RX medication.  Patient states that she saw Katy on 08/19/2018 for similar symptoms. Advised the patient that the office stops seeing patients at 12 on Fridays and for her to continue what she is doing and push fluids.  Also advised the patient that if she has a humiifier to use that.  And if symptoms worsen or if she starts running a fever to go to an urgent care.  Please review and advise.

## 2018-10-06 NOTE — Telephone Encounter (Signed)
I believe pt recently had an OV with me but cancelled.   She needs to be seen for these sx if they go beyond 10 days and NI or if dev F/C and 1 sided face pain- let us know

## 2018-10-06 NOTE — Telephone Encounter (Signed)
Pt informed.  Pt expressed understanding and is agreeable.  T. Kaydance Bowie, CMA  

## 2018-10-13 ENCOUNTER — Telehealth: Payer: Self-pay | Admitting: Family Medicine

## 2018-10-13 NOTE — Telephone Encounter (Signed)
Patient called states was advised to wait at least 10dys for Rx to work & to call back if symptoms remain--Per patient still experiencing Sinus infection, cough, facial pain & drainage.   --- Forwarding message to medical assistant to review with provider if patient needs another OV or if Rx can be called into pt's pharmacy.-- please call pt at [phone] if any questions.  --glh

## 2018-10-14 ENCOUNTER — Ambulatory Visit (INDEPENDENT_AMBULATORY_CARE_PROVIDER_SITE_OTHER): Payer: BLUE CROSS/BLUE SHIELD | Admitting: Diagnostic Neuroimaging

## 2018-10-14 ENCOUNTER — Encounter: Payer: Self-pay | Admitting: Diagnostic Neuroimaging

## 2018-10-14 VITALS — BP 138/84 | HR 85 | Ht 61.0 in | Wt 232.4 lb

## 2018-10-14 DIAGNOSIS — R200 Anesthesia of skin: Secondary | ICD-10-CM

## 2018-10-14 DIAGNOSIS — Z6841 Body Mass Index (BMI) 40.0 and over, adult: Secondary | ICD-10-CM | POA: Diagnosis not present

## 2018-10-14 DIAGNOSIS — R0683 Snoring: Secondary | ICD-10-CM | POA: Diagnosis not present

## 2018-10-14 NOTE — Telephone Encounter (Signed)
Spoke to patient she states that she is still having a productive cough and nasal congestion that is yellow when she blows her nose.  Patient states that symptoms are worse in the morning.  Patient finished antibiotic yesterday morning and is currently taking Advil cold and sinus.  Patient was seen by William HamburgerKaty Danford, NP on 08/19/2018 for similar symptoms and was given Augmentin patient states that symptoms resolved and came back 10 days ago.  Made appointment Friday for patient per note in chart.  Patient would like message to be sent to dr. Sharee Holsterpalski to review and advise if there is any that she can do before Friday. Please review and advise. MPulliam, CMA/RT(R)

## 2018-10-14 NOTE — Progress Notes (Signed)
[facility]  PATIENT: Nancy RuddyShaylyn Melton DOB: 12/07/2000  REFERRING CLINICIAN: S Deveshwar HISTORY FROM: patient and mother  REASON FOR VISIT: follow up    HISTORICAL  CHIEF COMPLAINT:  Chief Complaint  Patient presents with  . Pain    rm 7, motherSelena Batten- [name],  "twice my muscles don't work, especially in my thighs; right now my thighs are just sore, no correlation to activity; get electrical feelings in left mid-upper back; want to discuss MRI and next steps"  . Numbness  . Follow-up    HISTORY OF PRESENT ILLNESS:   UPDATE (10/14/18, VRP): Since last visit, doing about the same. Symptoms are stable. Severity is mild to moderate. No alleviating or aggravating factors. Now on metformin for weight control.    PRIOR HPI (09/01/18): 17 year old female here for evaluation of numbness, tingling, pain.  Age 17 years old patient had onset of multifocal pain, mainly along her spine.  Patient developed numbness, pins-and-needles and tingling sensation more recently mainly affecting her arms and legs.  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  Patient moved to [place] recently and has seen PCP and rheumatology, and autoimmune/inflammatory conditions have been ruled out.  Due to intermittent numbness and tingling patient was referred to neurology clinic for further evaluation.  Patient does not have any numbness or tingling from her neck up into her face or head.  She does have blurred vision, snoring, insomnia, restless legs, joint pain, joint swelling cramps aching muscle skin sensitivity and itching.  She does have some anxiety and racing thoughts symptoms.  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   REVIEW OF SYSTEMS: Full 14 system review of systems performed and negative with exception of: numbness joint pain cramps.   ALLERGIES: Allergies    Allergen Reactions  . Adhesive [Tape]   . Latex Swelling    HOME MEDICATIONS: Outpatient Medications Prior to Visit  Medication Sig Dispense Refill  . etonogestrel (NEXPLANON) 68 MG IMPL implant Nexplanon 68 mg subdermal implant  Inject 1 implant by subcutaneous route.    . metFORMIN (GLUCOPHAGE) 500 MG tablet Take 1 tablet (500 mg total) by mouth 2 (two) times daily with a meal. 180 tablet 0  . Liraglutide -Weight Management (SAXENDA) 18 MG/3ML SOPN Inject 0.6 mg into the skin daily. 0.6 mg injected subcutaneously daily for 1 week, then increase by 0.6 mg weekly until reaching 3 mg injected subcutaneous daily (Patient not taking: Reported on 09/01/2018) 6 pen 1   No facility-administered medications prior to visit.     PAST MEDICAL HISTORY: Past Medical History:  Diagnosis Date  . Fibromyalgia   . 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 Numbness and tingling     PAST SURGICAL HISTORY: Past Surgical History:  Procedure Laterality Date  . WISDOM TOOTH EXTRACTION  02/2018    FAMILY HISTORY: Family History  Problem Relation Age of Onset  . Other Father   . Healthy Brother   . Cervical cancer Maternal Grandmother   . Alzheimer's disease Paternal Grandfather     SOCIAL HISTORY: Social History   Socioeconomic History  . Marital status: Unknown    Spouse name: Not on file  . Number of children: Not on file  . Years of education: Not on file  . Highest education level: Some college, no degree  Occupational History    Employer: [employer]  Social Needs  . Financial resource strain:  Not on file  . Food insecurity:    Worry: Not on file    Inability: Not on file  . Transportation needs:    Medical: Not on file    Non-medical: Not on file  Tobacco Use  . Smoking status: Never Smoker  . Smokeless tobacco: Never Used  Substance and Sexual Activity  . Alcohol use: No  . Drug use: Never  . Sexual activity: Yes    Birth control/protection: None  Lifestyle  . Physical  activity:    Days per week: Not on file    Minutes per session: Not on file  . Stress: Not on file  Relationships  . Social connections:    Talks on phone: Not on file    Gets together: Not on file    Attends religious service: Not on file    Active member of club or organization: Not on file    Attends meetings of clubs or organizations: Not on file    Relationship status: Not on file  . Intimate partner violence:    Fear of current or ex partner: Not on file    Emotionally abused: Not on file    Physically abused: Not on file    Forced sexual activity: Not on file  Other Topics Concern  . Not on file  Social History Narrative   Right handed    Caffeine- None    Live's at home with mom and dad       PHYSICAL EXAM  GENERAL EXAM/CONSTITUTIONAL: Vitals:  Vitals:   10/14/18 0913  BP: 138/84  Pulse: 85  Weight: 232 lb 6.4 oz (105.4 kg)  Height: 5\' 1"  (1.549 m)   Body mass index is 43.91 kg/m. Wt Readings from Last 3 Encounters:  10/14/18 232 lb 6.4 oz (105.4 kg) (>99 %, Z= 2.34)*  09/01/18 235 lb (106.6 kg) (>99 %, Z= 2.36)*  09/01/18 235 lb (106.6 kg) (>99 %, Z= 2.36)*   * Growth percentiles are based on CDC (Girls, 2-20 Years) data.    Patient is in no distress; well developed, nourished and groomed; neck is supple  CARDIOVASCULAR:  Examination of carotid arteries is normal; no carotid bruits  Regular rate and rhythm, no murmurs  Examination of peripheral vascular system by observation and palpation is normal  EYES:  Ophthalmoscopic exam of optic discs and posterior segments is normal; no papilledema or hemorrhages No exam data present  MUSCULOSKELETAL:  Gait, strength, tone, movements noted in Neurologic exam below  NEUROLOGIC: MENTAL STATUS:  No flowsheet data found.  awake, alert, oriented to person, place and time  recent and remote memory intact  normal attention and concentration  language fluent, comprehension intact, naming  intact  fund of knowledge appropriate  CRANIAL NERVE:   2nd - no papilledema on fundoscopic exam  2nd, 3rd, 4th, 6th - pupils equal and reactive to light, visual fields full to confrontation, extraocular muscles intact, no nystagmus  5th - facial sensation symmetric  7th - facial strength symmetric  8th - hearing intact  9th - palate elevates symmetrically, uvula midline  11th - shoulder shrug symmetric  12th - tongue protrusion midline  MOTOR:   normal bulk and tone, full strength in the BUE, BLE  SENSORY:   normal and symmetric to light touch  COORDINATION:   finger-nose-finger, fine finger movements normal  REFLEXES:   deep tendon reflexes present and symmetric  GAIT/STATION:   narrow based gait     DIAGNOSTIC DATA (LABS, IMAGING, TESTING) - I  reviewed patient records, labs, notes, testing and imaging myself where available.  Lab Results  Component Value Date   WBC 11.2 04/29/2018   HGB 14.9 04/29/2018   HCT 44.7 04/29/2018   MCV 82.2 04/29/2018   PLT 362 04/29/2018      Component Value Date/Time   NA 138 04/29/2018 0932   K 4.0 04/29/2018 0932   CL 104 04/29/2018 0932   CO2 26 04/29/2018 0932   GLUCOSE 104 (H) 04/29/2018 0932   BUN 7 04/29/2018 0932   CREATININE 0.57 04/29/2018 0932   CALCIUM 9.6 04/29/2018 0932   PROT 7.3 04/29/2018 0932   AST 15 04/29/2018 0932   ALT 17 04/29/2018 0932   BILITOT 0.4 04/29/2018 0932   GFRNONAA 135 04/29/2018 0932   GFRAA 157 04/29/2018 0932   No results found for: CHOL, HDL, LDLCALC, LDLDIRECT, TRIG, CHOLHDL Lab Results  Component Value Date   HGBA1C 5.7 (H) 07/02/2018   Lab Results  Component Value Date   VITAMINB12 434 07/02/2018   Lab Results  Component Value Date   TSH 4.28 04/29/2018   Lab Results  Component Value Date   ANA NEGATIVE 04/29/2018   Lab Results  Component Value Date   ESRSEDRATE 22 (H) 04/29/2018   Cyclic Citrullin Peptide Ab  Date Value Ref Range Status   04/29/2018 <16 UNITS Final    Comment:    Reference Range Negative:            <20 Weak Positive:       20-39 Moderate Positive:   40-59 Strong Positive:     >59 .     04/29/18 Xray cervical [I reviewed images myself and agree with interpretation. -VRP]  - No disc space narrowing was noted. No facet joint arthropathy was noted. - Impression: Unremarkable x-ray of the C-spine.  10/22/16 xray knees (bilateral) [I reviewed images myself and agree with interpretation. -VRP]  - Slight lateral RIGHT patellar tilt. - Otherwise normal exam.  07/16/18 MRI brain and cervical spine [I reviewed images myself and agree with interpretation. -VRP]  - normal  09/16/18 PSG 1. Primary Snoring- loud. 2. Non-specific abnormal EKG. Screenshot attached.  3. Many spontaneous arousals, unrelated to measurable functions of physiology.     ASSESSMENT AND PLAN  17 y.o. year old female here with chronic pain, numbness, tingling, insomnia, obesity, hypermobile joints. Primary neurologic etiologies ruled out.    Dx:  1. Morbid obesity with body mass index (BMI) of 45.0 to 49.9 in adult [facility])   2. Numbness   3. Snoring     PLAN:  - optimize nutrition, exercise, stress mgmt and sleep    Return if symptoms worsen or fail to improve, for return to PCP.    Suanne Marker, MD 10/14/2018, 9:50 AM Certified in Neurology, Neurophysiology and Neuroimaging  [facility] [address] [place], [place] [address] [phone]

## 2018-10-15 NOTE — Telephone Encounter (Signed)
Called patient left message to call the office. MPulliam, CMA/RT(R)  

## 2018-10-15 NOTE — Telephone Encounter (Signed)
Since she was [facility], in order to see if it is different, has changed, improved or worsened, she can come in and see Orpha BurKaty for eval  prior to Friday and waiting to see me.  Since this is cold symptoms that she recently had and was put on antibiotics for, it might be best to have the same provider look at her and see how her symptoms have changed, worsened etc.

## 2018-10-17 ENCOUNTER — Ambulatory Visit (INDEPENDENT_AMBULATORY_CARE_PROVIDER_SITE_OTHER): Payer: BLUE CROSS/BLUE SHIELD | Admitting: Family Medicine

## 2018-10-17 ENCOUNTER — Encounter: Payer: Self-pay | Admitting: Family Medicine

## 2018-10-17 VITALS — BP 127/87 | HR 98 | Temp 98.0°F | Ht 61.0 in | Wt 231.3 lb

## 2018-10-17 DIAGNOSIS — Z6841 Body Mass Index (BMI) 40.0 and over, adult: Secondary | ICD-10-CM | POA: Diagnosis not present

## 2018-10-17 DIAGNOSIS — M357 Hypermobility syndrome: Secondary | ICD-10-CM

## 2018-10-17 DIAGNOSIS — J069 Acute upper respiratory infection, unspecified: Secondary | ICD-10-CM

## 2018-10-17 DIAGNOSIS — M2550 Pain in unspecified joint: Secondary | ICD-10-CM | POA: Diagnosis not present

## 2018-10-17 MED ORDER — HYDROCOD POLST-CPM POLST ER 10-8 MG/5ML PO SUER: 5.0000 mL | Freq: Two times a day (BID) | ORAL | 0 refills | Status: DC | PRN

## 2018-10-17 MED ORDER — AZITHROMYCIN 250 MG PO TABS: 250.0000 mg | ORAL_TABLET | Freq: Every day | ORAL | 0 refills | Status: DC

## 2018-10-17 MED ORDER — PREDNISONE 20 MG PO TABS: ORAL_TABLET | ORAL | 0 refills | Status: DC

## 2018-10-17 NOTE — Patient Instructions (Signed)
Last office visit on 10/21 it was recommended you follow-up in 4 weeks  (" Return in about 4 weeks (around 09/29/2018) for wt loss.")    which you did not.   It is important I monitor your weight loss treatment plans and I am here to help you with your weight loss/ "healthier you" journey.   :)    So please follow-up in the near future for this or you can choose to not work on it until after the holidays and then come in very end of January after you been working on it for 4 weeks and tracking everything in lose it or my fitness pal etc.      Symptoms for a upper respiratory tract infection usually last 3-7 days but can stretch out to 2-3 weeks before you're feeling back to normal.   You can use over-the-counter afrin nasal spray for up to 3 days (NO longer than that) which will help acutely with nasal drainage/ congestion short term.   Also, sterile saline nasal rinses, such as Lloyd HugerNeil med or AYR sinus rinses, can be very helpful and should be done twice daily- especially throughout the allergy season.   Remember you should use distilled water or previously boiled water to do this.  Then you may use over-the-counter Flonase 1 spray each nostril twice daily after sinus rinses.   You can also use an over the counter cold and flu medication such as Tylenol Severe Cold and Sinus/Flu or Dayquil, Nyquil and the like, which will help with cough, congestion, headache/ pain, fevers/chills etc.  Please note, if you being treated for hypertension or have high blood pressure, you should be using the cold meds designated "HBP".    Wash your hands frequently, as you did not want to get those around you sick as well. Never sneeze or cough on others.  And you should not be going to school or work if you are running a temperature of 100.5 or more on two separate occasions.   Drink plenty of fluids and stay hydrated, especially if you are running fevers.  We don't know why, but chicken soup also helps, try it!  :)

## 2018-10-17 NOTE — Progress Notes (Signed)
Pt here for an acute care OV today   Impression and Recommendations:    1. URI with cough and congestion   2. BMI 40.0-44.9, adult (HCC)   3. Hypermobility syndrome   4. Arthralgia, unspecified joint      Meds ordered this encounter  Medications  . predniSONE (DELTASONE) 20 MG tablet    Sig: Take 3 tabs po * 2 days, then 2 tabs for 2 d, then 1 tab 2 d, then 1/2 tab 2 days.    Dispense:  15 tablet    Refill:  0  . chlorpheniramine-HYDROcodone (TUSSIONEX) 10-8 MG/5ML SUER    Sig: Take 5 mLs by mouth every 12 (twelve) hours as needed for cough (cough, will cause drowsiness.).    Dispense:  200 mL    Refill:  0  . azithromycin (ZITHROMAX) 250 MG tablet    Sig: Take 1 tablet (250 mg total) by mouth daily. Take first 2 tablets together, then 1 every day until finished.    Dispense:  6 tablet    Refill:  0    Medications Discontinued During This Encounter  Medication Reason  . Liraglutide -Weight Management (SAXENDA) 18 MG/3ML SOPN Patient Preference     1. Sinusitis  - Decreased atmospheric humidity and decreased home humidity discussed with patient during this time a year.  I discussed the physiologic effects it has on sinus and nasal passages.    -Humidifier by bedside at night encouraged.     -Advised the patient to began using AYR or Neilmed sinus rinses BID followed by flonase BID (one spray to each nostril). Advised that the patient may also incorporate allegra or claritin PRN.   -Push water to drink one half of your weight in ounces of water per day.  - If develop Fevers and 1 sided face/ear pain- call clinic to inform us as you may need a change in our treatment plan  -Will prescribe steroid taper. Advised the patient to try taking the steroid taper for 2-3 days and then if her symptoms are not improving, then proceed with the abx.   -Will give the patient a prescription of an abx to use only if her symptoms worsen.  -Will prescribe cough medication to help  with sleep.     2. BMI 45-49.9 -Patient missed last 4 week follow up for weight loss counseling. -She is currently on Saxenda at this time.  -Advised the patient to follow up in the near future for a weight loss visit.   Follow up in the near future for weight loss visit.    Education and routine counseling performed. Handouts provided  Gross side effects, risk and benefits, and alternatives of medications and treatment plan in general discussed with patient.  Patient is aware that all medications have potential side effects and we are unable to predict every side effect or drug-drug interaction that may occur.   Patient will call with any questions prior to using medication if they have concerns.    Expresses verbal understanding and consents to current therapy and treatment regimen.  No barriers to understanding were identified.  Red flag symptoms and signs discussed in detail.  Patient expressed understanding regarding what to do in case of emergency\urgent symptoms   Please see AVS handed out to patient at the end of our visit for further patient instructions/ counseling done pertaining to today's office visit.   Return if symptoms worsen or fail to improve, for near future for wt loss.  Note:  This document was prepared occasionally using Dragon voice recognition software and may include unintentional dictation errors in addition to a scribe.  This document serves as a record of services personally performed by Thomasene Lot, DO. It was created on her behalf by Chestine Spore, a trained medical scribe. The creation of this record is based on the scribe's personal observations and the provider's statements to them.    ------------------------------------------------------------------------------------------------------------------------------------------------------------------------------------------------------------------------------   Subjective:    CC:  Chief Complaint    Patient presents with  . Sinus Problem    HPI: Nancy Bishop is a 17 y.o. female who presents to [facility] today for issues as discussed below.   She has had sinus issues. She was seen on 08/19/2018 for sinusitis. She was advised to use Flonase, which she has continued to use. She notes that her symptoms began 17 days ago with a sore throat. She hasn't had any improvement of her symptoms since onset. She can take steroids. She has associated cough. She has tried advil cold and sinus and flonase. Denies fever, ear pain, facial pain, painful breathing, and any other symptoms. She sleeps with an industrial fan and a fan that blows in her face.    Weight loss:  Patient wanted to update [place] regarding her weight loss and that she is pleased with her saxenda that she is being treated with. She knows that she missed an appointment for her weight loss counseling, however, she will reschedule that in the near future.   No problems updated.   Wt Readings from Last 3 Encounters:  10/17/18 231 lb 4.8 oz (104.9 kg) (>99 %, Z= 2.33)*  10/14/18 232 lb 6.4 oz (105.4 kg) (>99 %, Z= 2.34)*  09/01/18 235 lb (106.6 kg) (>99 %, Z= 2.36)*   * Growth percentiles are based on CDC (Girls, 2-20 Years) data.   BP Readings from Last 3 Encounters:  10/17/18 127/87  10/14/18 138/84  09/01/18 125/85   BMI Readings from Last 3 Encounters:  10/17/18 43.70 kg/m (99 %, Z= 2.29)*  10/14/18 43.91 kg/m (99 %, Z= 2.29)*  09/01/18 44.40 kg/m (99 %, Z= 2.32)*   * Growth percentiles are based on CDC (Girls, 2-20 Years) data.     Patient Care Team    Relationship Specialty Notifications Start End  Thomasene Lot, DO PCP - General Family Medicine  05/22/17   Dohmeier, Porfirio Mylar, MD Consulting Physician Neurology  09/01/18    Comment: sleep medicine  Suanne Marker, MD Consulting Physician Neurology  09/01/18   Pollyann Savoy, MD Consulting Physician Rheumatology  09/01/18       Patient Active Problem List   Diagnosis Date Noted  . Arthralgia 10/17/2018  . Snoring 09/19/2018  . Acute frontal sinusitis 08/19/2018  . Generalized Numbness and tingling 07/29/2018  . Non-intractable vomiting with nausea 04/01/2018  . Muscular aches 03/24/2018  . Muscular fatigue 03/24/2018  . Fibromyalgia 03/12/2018  . 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 chronic Joint pains 03/12/2018  . BMI 40.0-44.9, adult (HCC) 03/12/2018  . Muscle spasm-upper trap 03/12/2018  . Patellar dislocation, left, initial encounter 10/22/2016  . Patellar subluxation, right, initial encounter 10/22/2016  . Hypermobility syndrome 10/22/2016      Past Medical History:  Diagnosis Date  . Fibromyalgia   . 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 Numbness and tingling      Past Surgical History:  Procedure Laterality Date  . WISDOM TOOTH EXTRACTION  02/2018     Family History  Problem  Relation Age of Onset  . Other Father   . Healthy Brother   . Cervical cancer Maternal Grandmother   . Alzheimer's disease Paternal Grandfather      Social History   Socioeconomic History  . Marital status: Unknown    Spouse name: Not on file  . Number of children: Not on file  . Years of education: Not on file  . Highest education level: Some college, no degree  Occupational History    Employer: [employer]  Social Needs  . Financial resource strain: Not on file  . Food insecurity:    Worry: Not on file    Inability: Not on file  . Transportation needs:    Medical: Not on file    Non-medical: Not on file  Tobacco Use  . Smoking status: Never Smoker  . Smokeless tobacco: Never Used  Substance and Sexual Activity  . Alcohol use: No  . Drug use: Never  . Sexual activity: Yes    Birth control/protection: None  Lifestyle  . Physical activity:    Days per week: Not on file    Minutes per session: Not on file  . Stress: Not on file  Relationships  . Social connections:    Talks on phone: Not on  file    Gets together: Not on file    Attends religious service: Not on file    Active member of club or organization: Not on file    Attends meetings of clubs or organizations: Not on file    Relationship status: Not on file  . Intimate partner violence:    Fear of current or ex partner: Not on file    Emotionally abused: Not on file    Physically abused: Not on file    Forced sexual activity: Not on file  Other Topics Concern  . Not on file  Social History Narrative   Right handed    Caffeine- None    Live's at home with mom and dad       Current Meds  Medication Sig  . etonogestrel (NEXPLANON) 68 MG IMPL implant Nexplanon 68 mg subdermal implant  Inject 1 implant by subcutaneous route.  . metFORMIN (GLUCOPHAGE) 500 MG tablet Take 1 tablet (500 mg total) by mouth 2 (two) times daily with a meal.    Allergies:  Allergies  Allergen Reactions  . Adhesive [Tape]   . Latex Swelling     Review of Systems: General:   Denies fever, chills, unexplained weight loss.  Optho/Auditory:   Denies visual changes, blurred vision/LOV Respiratory:   Denies wheeze, DOE more than baseline levels.   Cardiovascular:   Denies chest pain, palpitations, new onset peripheral edema  Gastrointestinal:   Denies nausea, vomiting, diarrhea, abd pain.  Genitourinary: Denies dysuria, freq/ urgency, flank pain or discharge from genitals.  Endocrine:     Denies hot or cold intolerance, polyuria, polydipsia. Musculoskeletal:   Denies unexplained myalgias, joint swelling, unexplained arthralgias, gait problems.  Skin:  Denies new onset rash, suspicious lesions Neurological:     Denies dizziness, unexplained weakness, numbness  Psychiatric/Behavioral:   Denies mood changes, suicidal or homicidal ideations, hallucinations    Objective:   Blood pressure 127/87, pulse 98, temperature 98 F (36.7 C), height 5\' 1"  (1.549 m), weight 231 lb 4.8 oz (104.9 kg), SpO2 99 %, unknown if currently  breastfeeding. Body mass index is 43.7 kg/m. General:  Well Developed, well nourished, appropriate for stated age.  Neuro:  Alert and oriented,  extra-ocular muscles  intact  HEENT:  Normocephalic, atraumatic, neck supple Skin:  no gross rash, warm, pink. Cardiac:  RRR, S1 S2 Respiratory:  ECTA B/L and A/P, Not using accessory muscles, speaking in full sentences- unlabored. Vascular:  Ext warm, no cyanosis apprec.; cap RF less 2 sec. Psych:  No HI/SI, judgement and insight good, Euthymic mood. Full Affect.

## 2018-10-17 NOTE — Telephone Encounter (Signed)
Patient was seen in the office today. MPulliam, CMA/RT(R)  

## 2018-12-21 ENCOUNTER — Other Ambulatory Visit: Payer: Self-pay

## 2018-12-21 ENCOUNTER — Encounter (HOSPITAL_COMMUNITY): Payer: Self-pay | Admitting: Emergency Medicine

## 2018-12-21 ENCOUNTER — Emergency Department (HOSPITAL_COMMUNITY)
Admission: EM | Admit: 2018-12-21 | Discharge: 2018-12-22 | Disposition: A | Discharge status: Home / Self Care | Payer: BLUE CROSS/BLUE SHIELD | Attending: Emergency Medicine | Admitting: Emergency Medicine

## 2018-12-21 DIAGNOSIS — Z91040 Latex allergy status: Secondary | ICD-10-CM | POA: Insufficient documentation

## 2018-12-21 DIAGNOSIS — Z7984 Long term (current) use of oral hypoglycemic drugs: Secondary | ICD-10-CM | POA: Insufficient documentation

## 2018-12-21 DIAGNOSIS — Z79899 Other long term (current) drug therapy: Secondary | ICD-10-CM | POA: Diagnosis not present

## 2018-12-21 DIAGNOSIS — R1010 Upper abdominal pain, unspecified: Secondary | ICD-10-CM | POA: Insufficient documentation

## 2018-12-21 LAB — URINALYSIS, ROUTINE W REFLEX MICROSCOPIC
BILIRUBIN URINE: NEGATIVE
Glucose, UA: NEGATIVE mg/dL
Hgb urine dipstick: NEGATIVE
Ketones, ur: NEGATIVE mg/dL
Leukocytes, UA: NEGATIVE
Nitrite: NEGATIVE
Protein, ur: NEGATIVE mg/dL
Specific Gravity, Urine: 1.026 (ref 1.005–1.030)
pH: 6 (ref 5.0–8.0)

## 2018-12-21 MED ORDER — SODIUM CHLORIDE 0.9% FLUSH
3.0000 mL | Freq: Once | INTRAVENOUS | Status: AC
  Administered 2018-12-22: 3 mL via INTRAVENOUS

## 2018-12-21 NOTE — ED Triage Notes (Signed)
C/o sharp generalized abd pain and back pain with nausea x 30 min.

## 2018-12-22 ENCOUNTER — Emergency Department (HOSPITAL_COMMUNITY): Payer: BLUE CROSS/BLUE SHIELD

## 2018-12-22 LAB — CBC
HCT: 47.9 % — ABNORMAL HIGH (ref 36.0–46.0)
Hemoglobin: 15.6 g/dL — ABNORMAL HIGH (ref 12.0–15.0)
MCH: 28.2 pg (ref 26.0–34.0)
MCHC: 32.6 g/dL (ref 30.0–36.0)
MCV: 86.5 fL (ref 80.0–100.0)
Platelets: 397 10*3/uL (ref 150–400)
RBC: 5.54 MIL/uL — ABNORMAL HIGH (ref 3.87–5.11)
RDW: 12.3 % (ref 11.5–15.5)
WBC: 15.3 10*3/uL — AB (ref 4.0–10.5)
nRBC: 0 % (ref 0.0–0.2)

## 2018-12-22 LAB — I-STAT BETA HCG BLOOD, ED (MC, WL, AP ONLY)

## 2018-12-22 LAB — COMPREHENSIVE METABOLIC PANEL
ALT: 19 U/L (ref 0–44)
AST: 19 U/L (ref 15–41)
Albumin: 4.1 g/dL (ref 3.5–5.0)
Alkaline Phosphatase: 71 U/L (ref 38–126)
Anion gap: 13 (ref 5–15)
BUN: 6 mg/dL (ref 6–20)
CO2: 25 mmol/L (ref 22–32)
Calcium: 9.5 mg/dL (ref 8.9–10.3)
Chloride: 101 mmol/L (ref 98–111)
Creatinine, Ser: 0.72 mg/dL (ref 0.44–1.00)
GFR calc Af Amer: >60 mL/min (ref >60)
Glucose, Bld: 104 mg/dL — ABNORMAL HIGH (ref 70–99)
Potassium: 4 mmol/L (ref 3.5–5.1)
Sodium: 139 mmol/L (ref 135–145)
Total Bilirubin: 0.4 mg/dL (ref 0.3–1.2)
Total Protein: 7.9 g/dL (ref 6.5–8.1)

## 2018-12-22 LAB — LIPASE, BLOOD: Lipase: 26 U/L (ref 11–51)

## 2018-12-22 MED ORDER — ONDANSETRON HCL 4 MG/2ML IJ SOLN
4.0000 mg | Freq: Once | INTRAMUSCULAR | Status: DC
  Filled 2018-12-22: qty 2

## 2018-12-22 MED ORDER — SODIUM CHLORIDE 0.9 % IV BOLUS
1000.0000 mL | Freq: Once | INTRAVENOUS | Status: AC
  Administered 2018-12-22: 1000 mL via INTRAVENOUS

## 2018-12-22 NOTE — Discharge Instructions (Addendum)
Follow up with your doctor for recheck in 2 days. Return to the emergency department with any worsening symptoms or new concerns.

## 2018-12-22 NOTE — ED Notes (Signed)
ED Provider at bedside. 

## 2018-12-22 NOTE — ED Provider Notes (Signed)
[facility]   CSN: [id number] Arrival date & time: 12/21/18  2301     History   Chief Complaint Chief Complaint  Patient presents with  . Abdominal Pain    HPI Nancy Bishop is a 18 y.o. female.  Patient with no significant medical history presents for evaluation of upper, bilateral abdominal pain, radiation to the back, associated with nausea, that started around 10:30 this evening. Last eaten around 5:30. No fever, chest pain, diarrhea, bloating or flank pain. When she moves a certain way, the abdominal pain becomes apparent in her mid-back. No history of similar symptoms. She induced vomiting x 1 thinking it may give her some relief but denies decrease in symptoms at that time. Without further intervention, she states her pain has subsided over time and is now 4/10 pain.  The history is provided by the patient and a parent. No language interpreter was used.  Abdominal Pain  Associated symptoms: nausea and vomiting (See HPI.)   Associated symptoms: no chest pain, no chills, no dysuria, no fever and no shortness of breath     Past Medical History:  Diagnosis Date  . Fibromyalgia   . 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 Numbness and tingling     Patient Active Problem List   Diagnosis Date Noted  . Arthralgia 10/17/2018  . Snoring 09/19/2018  . Acute frontal sinusitis 08/19/2018  . Generalized Numbness and tingling 07/29/2018  . Non-intractable vomiting with nausea 04/01/2018  . Muscular aches 03/24/2018  . Muscular fatigue 03/24/2018  . Fibromyalgia 03/12/2018  . 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 chronic Joint pains 03/12/2018  . BMI 40.0-44.9, adult (HCC) 03/12/2018  . Muscle spasm-upper trap 03/12/2018  . Patellar dislocation, left, initial encounter 10/22/2016  . Patellar subluxation, right, initial encounter 10/22/2016  . 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  WISDOM TOOTH EXTRACTION  02/2018     OB History    Gravida  1   Para      Term      Preterm      AB      Living        SAB      TAB      Ectopic      Multiple      Live Births               Home Medications    Prior to Admission medications   Medication Sig Start Date End Date Taking? Authorizing Provider  azithromycin (ZITHROMAX) 250 MG tablet Take 1 tablet (250 mg total) by mouth daily. Take first 2 tablets together, then 1 every day until finished. 10/17/18   Opalski, Gavin Poundeborah, DO  chlorpheniramine-HYDROcodone (TUSSIONEX) 10-8 MG/5ML SUER Take 5 mLs by mouth every 12 (twelve) hours as needed for cough (cough, will cause drowsiness.). 10/17/18   Thomasene Lotpalski, Deborah, DO  etonogestrel (NEXPLANON) 68 MG IMPL implant Nexplanon 68 mg subdermal implant  Inject 1 implant by subcutaneous route.    [provider]  metFORMIN (GLUCOPHAGE) 500 MG tablet Take 1 tablet (500 mg total) by mouth 2 (two) times daily with a meal. 09/16/18   Opalski, Gavin Poundeborah, DO  predniSONE (DELTASONE) 20 MG tablet Take 3 tabs po * 2 days, then 2 tabs for 2 d, then 1 tab 2 d, then 1/2 tab 2 days. 10/17/18   Thomasene Lotpalski, Deborah, DO    Family History Family History  Problem Relation Age of Onset  .  Other Father   . Healthy Brother   . Cervical cancer Maternal Grandmother   . Alzheimer's disease Paternal Grandfather     Social History Social History   Tobacco Use  . Smoking status: Never Smoker  . Smokeless tobacco: Never Used  Substance Use Topics  . Alcohol use: No  . Drug use: Never     Allergies   Adhesive [tape] and Latex   Review of Systems Review of Systems  Constitutional: Negative for chills and fever.  Respiratory: Negative.  Negative for shortness of breath.   Cardiovascular: Negative.  Negative for chest pain.  Gastrointestinal: Positive for abdominal pain, nausea and vomiting (See HPI.).  Genitourinary: Negative.  Negative for dysuria.  Musculoskeletal: Negative.     Skin: Negative.   Neurological: Negative.      Physical Exam Updated Vital Signs BP 120/67 (BP Location: Right Arm)   Pulse (!) 117   Temp 98.1 F (36.7 C) (Oral)   Resp 20   Ht 5' (1.524 m)   Wt 104.3 kg   SpO2 100%   BMI 44.92 kg/m   Physical Exam Vitals signs and nursing note reviewed.  Constitutional:      Appearance: She is well-developed.  HENT:     Head: Normocephalic.  Neck:     Musculoskeletal: Normal range of motion and neck supple.  Cardiovascular:     Rate and Rhythm: Regular rhythm. Tachycardia present.  Pulmonary:     Effort: Pulmonary effort is normal.     Breath sounds: Normal breath sounds. No wheezing, rhonchi or rales.  Abdominal:     General: Bowel sounds are normal. There is no distension.     Palpations: Abdomen is soft.     Tenderness: There is no abdominal tenderness. There is no guarding or rebound.  Musculoskeletal: Normal range of motion.  Skin:    General: Skin is warm and dry.     Findings: No rash.  Neurological:     Mental Status: She is alert.     Cranial Nerves: No cranial nerve deficit.      ED Treatments / Results  Labs (all labs ordered are listed, but only abnormal results are displayed) Labs Reviewed  COMPREHENSIVE METABOLIC PANEL - Abnormal; Notable for the following components:      Result Value   Glucose, Bld 104 (*)    All other components within normal limits  CBC - Abnormal; Notable for the following components:   WBC 15.3 (*)    RBC 5.54 (*)    Hemoglobin 15.6 (*)    HCT 47.9 (*)    All other components within normal limits  URINALYSIS, ROUTINE W REFLEX MICROSCOPIC - Abnormal; Notable for the following components:   APPearance HAZY (*)    All other components within normal limits  LIPASE, BLOOD  I-STAT BETA HCG BLOOD, ED ([facility], [facility], [facility] ONLY)    EKG None  Radiology No results found.  Procedures Procedures (including critical care time)  Medications Ordered in ED Medications  sodium chloride flush  (NS) 0.9 % injection 3 mL (has no administration in time range)  sodium chloride 0.9 % bolus 1,000 mL (has no administration in time range)     Initial Impression / Assessment and Plan / ED Course  I have reviewed the triage vital signs and the nursing notes.  Pertinent labs & imaging results that were available during my care of the patient were reviewed by me and considered in my medical decision making (see chart for details).  Patient to ED with upper abdominal pain, extending through to the central back starting tonight around 10:30. She has had nausea with one episode induced vomiting for attempted relief. No diarrhea, fever, chest pain, SOB. No history of same.   Pain has decreased over time. She has a benign abdominal exam. She is tachycardic to 118. IVF's ordered to address, and on recheck after fluids, the heart rate is normal at 94.   Mild leukocytosis, otherwise, labs unremarkable. She and mother report she has had elevated WBC's and hgb before, "just above normal". Abdominal series in unremarkable.   She is drinking fluids. No more abdominal pain on recheck. No tenderness. She is felt appropriate for discharge home. She and mom are comfortable with plan.   Final Clinical Impressions(s) / ED Diagnoses   Final diagnoses:  None   1. Abdominal pain  ED Discharge Orders    None       Elpidio Anis, PA-C 12/22/18 0603    Ward, Layla Maw, DO 12/22/18 [phone]

## 2018-12-22 NOTE — ED Provider Notes (Signed)
Medical screening examination/treatment/procedure(s) were conducted as a shared visit with non-physician practitioner(s) and myself.  I personally evaluated the patient during the encounter.  None   Patient is a 18 year old female who presents to the emergency department with diffuse abdominal pain and nausea that started yesterday evening.  Made herself vomit several times without relief of pain.  Tachycardic here with leukocytosis but states she is feeling better.  Serial abdominal exams have been benign.  Specifically no tenderness in the right upper quadrant and no tenderness in the right lower quadrant.  She has a negative Murphy sign.  Otherwise labs unremarkable.  Urine shows no blood or sign of infection.  Heart rate has improved.  Discussed at length return precautions and importance of outpatient follow-up with PCP in the next 48 hours.  Patient and mother comfortable with this plan.  Suspect onset of viral illness.  Low suspicion currently for cholecystitis, colitis, diverticulitis, appendicitis, bowel obstruction.   Brandilee Pies, Layla MawKristen N, DO 12/22/18 [phone]

## 2019-05-07 ENCOUNTER — Ambulatory Visit: Payer: BLUE CROSS/BLUE SHIELD | Admitting: Obstetrics and Gynecology

## 2020-04-07 ENCOUNTER — Ambulatory Visit
Admission: EM | Admit: 2020-04-07 | Discharge: 2020-04-07 | Disposition: A | Discharge status: Home / Self Care | Payer: Self-pay | Attending: Emergency Medicine | Admitting: Emergency Medicine

## 2020-04-07 ENCOUNTER — Telehealth: Payer: Self-pay | Admitting: Radiology

## 2020-04-07 ENCOUNTER — Ambulatory Visit (INDEPENDENT_AMBULATORY_CARE_PROVIDER_SITE_OTHER): Payer: Self-pay

## 2020-04-07 ENCOUNTER — Other Ambulatory Visit: Payer: Self-pay

## 2020-04-07 DIAGNOSIS — M79602 Pain in left arm: Secondary | ICD-10-CM

## 2020-04-07 DIAGNOSIS — R079 Chest pain, unspecified: Secondary | ICD-10-CM

## 2020-04-07 DIAGNOSIS — S0083XA Contusion of other part of head, initial encounter: Secondary | ICD-10-CM

## 2020-04-07 DIAGNOSIS — R0789 Other chest pain: Secondary | ICD-10-CM

## 2020-04-07 DIAGNOSIS — M79632 Pain in left forearm: Secondary | ICD-10-CM

## 2020-04-07 MED ORDER — NAPROXEN 500 MG PO TABS: 500.0000 mg | ORAL_TABLET | Freq: Two times a day (BID) | ORAL | 0 refills | Status: DC

## 2020-04-07 MED ORDER — CYCLOBENZAPRINE HCL 5 MG PO TABS: 5.0000 mg | ORAL_TABLET | Freq: Two times a day (BID) | ORAL | 0 refills | Status: AC | PRN
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
## 2020-04-07 NOTE — Discharge Instructions (Signed)
Recommend RICE: rest, ice, compression, elevation as needed for pain.    Heat therapy (hot compress, warm wash rag, hot showers, etc.) can help relax muscles and soothe muscle aches. Cold therapy (ice packs) can be used to help swelling both after injury and after prolonged use of areas of chronic pain/aches.  For pain: Take naproxen twice daily. May take a Tylenol additionally if needed.  May take muscle relaxer as needed for severe pain / spasm.  (This medication may cause you to become tired so it is important you do not drink alcohol or operate heavy machinery while on this medication.  Recommend your first dose to be taken before bedtime to monitor for side effects safely)

## 2020-04-07 NOTE — Telephone Encounter (Signed)
Discovered patient's necklace while cleaning the room she was in. Left message for patient explaining her necklace was left here after she was discharged. Explained her necklace would be at the front desk.
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
## 2020-04-21 ENCOUNTER — Encounter: Payer: Self-pay | Admitting: Physician Assistant

## 2020-04-21 ENCOUNTER — Other Ambulatory Visit: Payer: Self-pay

## 2020-04-21 ENCOUNTER — Ambulatory Visit (INDEPENDENT_AMBULATORY_CARE_PROVIDER_SITE_OTHER): Payer: Commercial Managed Care - PPO | Admitting: Physician Assistant

## 2020-04-21 DIAGNOSIS — R0789 Other chest pain: Secondary | ICD-10-CM | POA: Diagnosis not present

## 2020-04-21 DIAGNOSIS — F4322 Adjustment disorder with anxiety: Secondary | ICD-10-CM | POA: Diagnosis not present

## 2020-04-21 DIAGNOSIS — M79632 Pain in left forearm: Secondary | ICD-10-CM

## 2020-04-21 DIAGNOSIS — M25562 Pain in left knee: Secondary | ICD-10-CM | POA: Diagnosis not present

## 2020-04-21 DIAGNOSIS — M791 Myalgia, unspecified site: Secondary | ICD-10-CM | POA: Diagnosis not present

## 2020-04-21 MED ORDER — BACLOFEN 10 MG PO TABS: 10.0000 mg | ORAL_TABLET | Freq: Three times a day (TID) | ORAL | 0 refills | Status: DC

## 2020-04-21 NOTE — Patient Instructions (Addendum)
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
## 2020-04-25 ENCOUNTER — Encounter: Payer: Self-pay | Admitting: Physician Assistant

## 2020-04-25 MED ORDER — VENLAFAXINE HCL ER 37.5 MG PO CP24: 37.5000 mg | ORAL_CAPSULE | Freq: Every day | ORAL | 1 refills | Status: DC

## 2020-05-05 ENCOUNTER — Ambulatory Visit: Payer: Commercial Managed Care - PPO | Attending: Physician Assistant | Admitting: Physical Therapy

## 2020-05-05 ENCOUNTER — Other Ambulatory Visit: Payer: Self-pay

## 2020-05-05 ENCOUNTER — Encounter: Payer: Self-pay | Admitting: Physical Therapy

## 2020-05-05 DIAGNOSIS — R2689 Other abnormalities of gait and mobility: Secondary | ICD-10-CM

## 2020-05-05 DIAGNOSIS — M6281 Muscle weakness (generalized): Secondary | ICD-10-CM | POA: Diagnosis present

## 2020-05-05 DIAGNOSIS — M545 Low back pain, unspecified: Secondary | ICD-10-CM

## 2020-05-05 DIAGNOSIS — M79604 Pain in right leg: Secondary | ICD-10-CM | POA: Insufficient documentation

## 2020-05-05 DIAGNOSIS — M546 Pain in thoracic spine: Secondary | ICD-10-CM | POA: Diagnosis present

## 2020-05-05 DIAGNOSIS — M542 Cervicalgia: Secondary | ICD-10-CM | POA: Insufficient documentation

## 2020-05-06 ENCOUNTER — Encounter: Payer: Self-pay | Admitting: Physical Therapy
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
## 2020-05-10 ENCOUNTER — Telehealth: Payer: Self-pay | Admitting: Physician Assistant

## 2020-05-10 DIAGNOSIS — M791 Myalgia, unspecified site: Secondary | ICD-10-CM
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
## 2020-05-10 NOTE — Telephone Encounter (Signed)
Patient made aware of the below and was upset that we were unable to give note for work. She asked what the reason was and I advised her there was not findings on physical exam that wound hinder her ability to work.  Patient did not ask for Baclofen-no refill given. Patient advised Naproxen was OTC. Patient agreeable to referral to Ortho. Kandis Cocking is aware of this.  AS,CMA

## 2020-05-10 NOTE — Telephone Encounter (Signed)
Referral placed. AS< CMA

## 2020-05-10 NOTE — Telephone Encounter (Signed)
Spoke with Kandis Cocking who states patient can but Naproxen (med given by [facility]) OTC. Also states we can send in #60 of Baclofen if patient requests.   Kandis Cocking states we can also place referral to Ortho if patient wishes. But we are unable to provide a note for work if that is what's being requested.    Left message for patient to call back. AS, CMA

## 2020-05-19 ENCOUNTER — Ambulatory Visit (INDEPENDENT_AMBULATORY_CARE_PROVIDER_SITE_OTHER): Payer: Commercial Managed Care - PPO | Admitting: Family Medicine

## 2020-05-19 ENCOUNTER — Encounter: Payer: Self-pay | Admitting: Family Medicine

## 2020-05-19 ENCOUNTER — Other Ambulatory Visit: Payer: Self-pay

## 2020-05-19 DIAGNOSIS — M5441 Lumbago with sciatica, right side: Secondary | ICD-10-CM

## 2020-05-19 DIAGNOSIS — M542 Cervicalgia: Secondary | ICD-10-CM | POA: Diagnosis not present

## 2020-05-19 MED ORDER — NABUMETONE 750 MG PO TABS: 750.0000 mg | ORAL_TABLET | Freq: Two times a day (BID) | ORAL | 6 refills | Status: DC | PRN

## 2020-05-19 MED ORDER — TIZANIDINE HCL 2 MG PO TABS: 2.0000 mg | ORAL_TABLET | Freq: Four times a day (QID) | ORAL | 1 refills | Status: DC | PRN
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
## 2020-05-20 ENCOUNTER — Ambulatory Visit: Payer: Commercial Managed Care - PPO | Attending: Physician Assistant | Admitting: Physical Therapy

## 2020-05-20 DIAGNOSIS — R2689 Other abnormalities of gait and mobility: Secondary | ICD-10-CM | POA: Diagnosis present

## 2020-05-20 DIAGNOSIS — M6281 Muscle weakness (generalized): Secondary | ICD-10-CM | POA: Insufficient documentation

## 2020-05-20 DIAGNOSIS — M79604 Pain in right leg: Secondary | ICD-10-CM | POA: Insufficient documentation

## 2020-05-20 DIAGNOSIS — M542 Cervicalgia: Secondary | ICD-10-CM | POA: Diagnosis present

## 2020-05-20 DIAGNOSIS — M545 Low back pain, unspecified: Secondary | ICD-10-CM

## 2020-05-20 DIAGNOSIS — M546 Pain in thoracic spine: Secondary | ICD-10-CM | POA: Insufficient documentation
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
## 2020-05-27 ENCOUNTER — Ambulatory Visit: Payer: Commercial Managed Care - PPO | Admitting: Physical Therapy

## 2020-06-01 ENCOUNTER — Telehealth: Payer: Self-pay | Admitting: Physician Assistant
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
## 2020-06-01 NOTE — Telephone Encounter (Signed)
Left message for patient to call back. AS, CMA 
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
## 2020-06-01 NOTE — Telephone Encounter (Signed)
Since patient is being managed by Orthopedics and they are diagnosing her with nerve damage, then it is most appropriate for them to fill out FMLA forms. If orthopedics is unwilling or unable to, then I can fill out the forms and will base information from her consult notes.  Thank you, Kandis Cocking

## 2020-06-01 NOTE — Telephone Encounter (Signed)
Patient is aware of this and verbalized understanding. AS, CMA 

## 2020-06-02 ENCOUNTER — Ambulatory Visit: Payer: Commercial Managed Care - PPO | Admitting: Physical Therapy

## 2020-06-07 ENCOUNTER — Encounter: Payer: Self-pay | Admitting: Physician Assistant

## 2020-06-07 ENCOUNTER — Other Ambulatory Visit: Payer: Self-pay

## 2020-06-07 ENCOUNTER — Ambulatory Visit: Payer: Commercial Managed Care - PPO | Admitting: Physician Assistant

## 2020-06-07 VITALS — BP 134/85 | HR 98 | Ht 60.0 in | Wt 244.6 lb

## 2020-06-07 DIAGNOSIS — M545 Low back pain, unspecified: Secondary | ICD-10-CM

## 2020-06-07 DIAGNOSIS — F4322 Adjustment disorder with anxiety: Secondary | ICD-10-CM | POA: Diagnosis not present

## 2020-06-07 DIAGNOSIS — R0789 Other chest pain: Secondary | ICD-10-CM | POA: Diagnosis not present
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
## 2020-06-08 ENCOUNTER — Ambulatory Visit: Payer: Commercial Managed Care - PPO | Admitting: Family Medicine

## 2020-06-10 ENCOUNTER — Encounter: Payer: Commercial Managed Care - PPO | Admitting: Physical Therapy

## 2020-06-15 ENCOUNTER — Encounter: Payer: Self-pay | Admitting: Family Medicine

## 2020-06-15 ENCOUNTER — Other Ambulatory Visit: Payer: Self-pay

## 2020-06-15 ENCOUNTER — Ambulatory Visit (INDEPENDENT_AMBULATORY_CARE_PROVIDER_SITE_OTHER): Payer: Commercial Managed Care - PPO | Admitting: Family Medicine

## 2020-06-15 DIAGNOSIS — M5441 Lumbago with sciatica, right side: Secondary | ICD-10-CM

## 2020-06-15 DIAGNOSIS — M542 Cervicalgia: Secondary | ICD-10-CM | POA: Diagnosis not present
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
## 2020-07-11 ENCOUNTER — Other Ambulatory Visit: Payer: Self-pay | Admitting: Family Medicine

## 2020-07-14 ENCOUNTER — Other Ambulatory Visit: Payer: Self-pay

## 2020-07-14 ENCOUNTER — Ambulatory Visit
Admission: RE | Admit: 2020-07-14 | Discharge: 2020-07-14 | Disposition: A | Discharge status: Home / Self Care | Payer: Commercial Managed Care - PPO | Source: Ambulatory Visit | Attending: Family Medicine | Admitting: Family Medicine

## 2020-07-14 DIAGNOSIS — M5441 Lumbago with sciatica, right side: Secondary | ICD-10-CM

## 2020-07-14 DIAGNOSIS — M542 Cervicalgia: Secondary | ICD-10-CM

## 2020-07-15 ENCOUNTER — Telehealth: Payer: Self-pay | Admitting: Family Medicine

## 2020-07-15 NOTE — Telephone Encounter (Signed)
MRI scans of neck and low back do not show any disc herniations or nerve impingement.  No indication for surgery.   There is evidence of early degenerative change at the T2-3 level which could be a source of intermittent pain in the future.  But I suspect that with time, pain should subside.  Would continue with chiropractic per Dr. Hollice Espy.

## 2020-09-13 ENCOUNTER — Telehealth: Payer: Self-pay | Admitting: Family Medicine

## 2020-09-13 NOTE — Telephone Encounter (Signed)
Called patient left message to return call to schedule an appointment with Dr Prince Rome per [facility] request to discuss and better understand her condition.

## 2020-10-11 ENCOUNTER — Encounter: Payer: Commercial Managed Care - PPO | Admitting: Women's Health

## 2020-10-18 ENCOUNTER — Other Ambulatory Visit: Payer: Self-pay

## 2020-10-18 ENCOUNTER — Encounter: Payer: Self-pay | Admitting: Women's Health

## 2020-10-18 ENCOUNTER — Ambulatory Visit (INDEPENDENT_AMBULATORY_CARE_PROVIDER_SITE_OTHER): Payer: Commercial Managed Care - PPO | Admitting: Women's Health

## 2020-10-18 VITALS — BP 117/73 | HR 110 | Ht 60.0 in | Wt 242.4 lb

## 2020-10-18 DIAGNOSIS — Z3046 Encounter for surveillance of implantable subdermal contraceptive: Secondary | ICD-10-CM

## 2020-10-18 NOTE — Patient Instructions (Signed)
Keep the area clean and dry.  You can remove the big bandage in 24 hours, and the small steri-strip bandage in 3-5 days.  

## 2020-10-18 NOTE — Progress Notes (Signed)
   [employer] REMOVAL Patient name: Nancy Bishop MRN [id number]  Date of birth: 2001/03/19 Subjective Findings:   Nancy Bishop is a 19 y.o. G0P0 Caucasian female being seen today for removal of a Nexplanon. Her Nexplanon was placed Oct 2018 in [place].  She desires removal because it's time, and she just got married in Oct and they are wanting to try for a baby. Signed copy of informed consent in chart. Not taking pnv yet.   Patient's last menstrual period was 10/14/2020. Last pap never. Results were:  n/a The planned method of family planning is none Depression screen [facility] 2/9 06/07/2020 04/21/2020 08/19/2018 07/29/2018 04/01/2018  Decreased Interest 0 1 0 0 0  Down, Depressed, Hopeless 0 1 0 0 0  PHQ - 2 Score 0 2 0 0 0  Altered sleeping 3 1 3  - 1  Tired, decreased energy 2 1 3  - 1  Change in appetite 0 0 0 - 1  Feeling bad or failure about yourself  0 0 0 - 0  Trouble concentrating 0 0 1 - 0  Moving slowly or fidgety/restless 0 1 0 - 0  Suicidal thoughts 0 0 0 - 0  PHQ-9 Score 5 5 7  - 3  Difficult doing work/chores - Not difficult at all Not difficult at all - Not difficult at all    Pertinent History Reviewed:   Reviewed past medical,surgical, social, obstetrical and family history.  Reviewed problem list, medications and allergies. Objective Findings & Procedure:    Vitals:   10/18/20 1441  BP: 117/73  Pulse: (!) 110  Weight: 242 lb 6.4 oz (110 kg)  Height: 5' (1.524 m)  Body mass index is 47.34 kg/m.  No results found for this or any previous visit (from the past 24 hour(s)).   Time out was performed.  Nexplanon site identified.  Area prepped in usual sterile fashon. One cc of 2% lidocaine was used to anesthetize the area at the distal end of the implant. A small stab incision was made right beside the implant on the distal portion.  The Nexplanon rod was grasped using hemostats and removed without difficulty.  There was less than 3 cc blood loss. There were no complications.   Steri-strips were applied over the small incision and a pressure bandage was applied.  The patient tolerated the procedure well. Assessment & Plan:   1) Nexplanon removal She was instructed to keep the area clean and dry, remove pressure bandage in 24 hours, and keep insertion site covered with the steri-strip for 3-5 days.   Follow-up PRN problems.  2) Desires pregnancy> start pnv, let know when gets +HPT  No orders of the defined types were placed in this encounter.   Follow-up: Return for 1st available, Pap & physical.  CNM, [facility] 10/18/2020 3:09 PM

## 2020-10-21 ENCOUNTER — Encounter: Payer: Self-pay | Admitting: Adult Health

## 2020-11-29 ENCOUNTER — Other Ambulatory Visit: Payer: Commercial Managed Care - PPO | Admitting: Adult Health

## 2021-01-09 ENCOUNTER — Other Ambulatory Visit: Payer: Self-pay | Admitting: Women's Health

## 2021-01-09 DIAGNOSIS — Z319 Encounter for procreative management, unspecified: Secondary | ICD-10-CM

## 2021-01-11 LAB — PROGESTERONE: Progesterone: 0.3 ng/mL

## 2021-01-23 ENCOUNTER — Other Ambulatory Visit: Payer: Self-pay | Admitting: Women's Health

## 2021-01-23 DIAGNOSIS — Z319 Encounter for procreative management, unspecified: Secondary | ICD-10-CM

## 2021-02-08 LAB — PROGESTERONE: Progesterone: 0.1 ng/mL

## 2021-02-23 ENCOUNTER — Encounter: Payer: Self-pay | Admitting: Women's Health

## 2021-02-23 ENCOUNTER — Other Ambulatory Visit: Payer: Self-pay

## 2021-02-23 ENCOUNTER — Ambulatory Visit (INDEPENDENT_AMBULATORY_CARE_PROVIDER_SITE_OTHER): Payer: Commercial Managed Care - PPO | Admitting: Women's Health

## 2021-02-23 ENCOUNTER — Other Ambulatory Visit: Payer: Self-pay | Admitting: Women's Health

## 2021-02-23 VITALS — BP 128/81 | HR 97 | Ht 60.0 in | Wt 249.6 lb

## 2021-02-23 DIAGNOSIS — Z131 Encounter for screening for diabetes mellitus: Secondary | ICD-10-CM

## 2021-02-23 DIAGNOSIS — N970 Female infertility associated with anovulation: Secondary | ICD-10-CM | POA: Diagnosis not present

## 2021-02-23 DIAGNOSIS — Z1329 Encounter for screening for other suspected endocrine disorder: Secondary | ICD-10-CM | POA: Diagnosis not present

## 2021-02-23 MED ORDER — CLOMIPHENE CITRATE 50 MG PO TABS: 50.0000 mg | ORAL_TABLET | Freq: Every day | ORAL | 2 refills | Status: DC

## 2021-02-23 NOTE — Progress Notes (Signed)
GYN VISIT Patient name: Nancy Bishop MRN [id number]  Date of birth: 2001/02/13 Chief Complaint:   fertility  History of Present Illness:   Nancy Bishop is a 20 y.o. G0P0 Caucasian female being seen today to discuss fertility. Nexplanon removed 10/18/20, has had regular monthly periods since. Was checking home ovulation predictor kits which were negative, so we checked day 21 progesterone x 2 cycles, and both revealed anovulatory cycles. Does have facial hair she shaves, states started when she was younger- had peach fuzz on upper lip and sideburn area she was self-conscious about, so started shaving. Denies darkened skin neck/axilla, hair loss from head, or acne. Reports regular cycles prior to Nexplanon. Disheartened b/c everyone else in family has been able to get pregnant quickly.  Had A1C in 2019 that was in pre-diabetes range. Has strong family h/o DM.  Depression screen [facility] 2/9 06/07/2020 04/21/2020 08/19/2018 07/29/2018 04/01/2018  Decreased Interest 0 1 0 0 0  Down, Depressed, Hopeless 0 1 0 0 0  PHQ - 2 Score 0 2 0 0 0  Altered sleeping 3 1 3  - 1  Tired, decreased energy 2 1 3  - 1  Change in appetite 0 0 0 - 1  Feeling bad or failure about yourself  0 0 0 - 0  Trouble concentrating 0 0 1 - 0  Moving slowly or fidgety/restless 0 1 0 - 0  Suicidal thoughts 0 0 0 - 0  PHQ-9 Score 5 5 7  - 3  Difficult doing work/chores - Not difficult at all Not difficult at all - Not difficult at all    Patient's last menstrual period was 02/22/2021. The current method of family planning is none.  Last pap never. Results were: N/A Review of Systems:   Pertinent items are noted in HPI Denies fever/chills, dizziness, headaches, visual disturbances, fatigue, shortness of breath, chest pain, abdominal pain, vomiting, abnormal vaginal discharge/itching/odor/irritation, problems with periods, bowel movements, urination, or intercourse unless otherwise stated above.  Pertinent History Reviewed:  Reviewed  past medical,surgical, social, obstetrical and family history.  Reviewed problem list, medications and allergies. Physical Assessment:   Vitals:   02/23/21 1521  BP: 128/81  Pulse: 97  Weight: 249 lb 9.6 oz (113.2 kg)  Height: 5' (1.524 m)  Body mass index is 48.75 kg/m.       Physical Examination:   General appearance: alert, well appearing, and in no distress  Mental status: alert, oriented to person, place, and time  Skin: warm & dry   Cardiovascular: normal heart rate noted  Respiratory: normal respiratory effort, no distress  Abdomen: soft, non-tender   Pelvic: examination not indicated  Extremities: no edema   Chaperone: N/A    No results found for this or any previous visit (from the past 24 hour(s)).  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 Will check A1C and TSH. Doesn't sound like she has PCOS, is having regular periods and did prior to Nexplanon as well. Does have some facial hair, but started shaving peach-fuzz at a young age- and has just continued. No other evidence of hyperadrogenism/PCOS. Discussed option of giving her body more time- which she doesn't want to do; also discussed if A1C returns in pre-DM/DM range can start metformin +/- clomid to promote ovulation.  Twins run on her side of family, so she/husband are concerned about risk for multiples w/ clomid (discussed 7-9%chance of twins, 0.3% chance of triplets). Will discuss lab results w/ her on Monday (office closed tomorrow),  and go from there.  2) Past due for pap> schedule today  Meds: No orders of the defined types were placed in this encounter.   Orders Placed This Encounter  Procedures  . TSH  . Hemoglobin A1c    Return for w/in next few months for pap & physical.  Cheral Marker CNM, WHNP-BC 02/23/2021 5:00 PM

## 2021-02-24 LAB — HEMOGLOBIN A1C
Est. average glucose Bld gHb Est-mCnc: 120 mg/dL
Hgb A1c MFr Bld: 5.8 % — ABNORMAL HIGH (ref 4.8–5.6)

## 2021-02-24 LAB — TSH: TSH: 1.37 u[IU]/mL (ref 0.450–4.500)

## 2021-02-27 ENCOUNTER — Ambulatory Visit: Payer: Commercial Managed Care - PPO | Admitting: Women's Health

## 2021-02-28 ENCOUNTER — Other Ambulatory Visit: Payer: Self-pay | Admitting: Women's Health

## 2021-02-28 MED ORDER — METFORMIN HCL 500 MG PO TABS: 500.0000 mg | ORAL_TABLET | Freq: Two times a day (BID) | ORAL | 3 refills | Status: DC

## 2021-04-03 ENCOUNTER — Other Ambulatory Visit: Payer: Self-pay | Admitting: Women's Health

## 2021-04-03 DIAGNOSIS — Z319 Encounter for procreative management, unspecified: Secondary | ICD-10-CM

## 2021-04-18 ENCOUNTER — Other Ambulatory Visit: Payer: Self-pay

## 2021-04-18 ENCOUNTER — Ambulatory Visit (INDEPENDENT_AMBULATORY_CARE_PROVIDER_SITE_OTHER): Payer: Commercial Managed Care - PPO | Admitting: Women's Health

## 2021-04-18 ENCOUNTER — Other Ambulatory Visit (HOSPITAL_COMMUNITY)
Admission: RE | Admit: 2021-04-18 | Discharge: 2021-04-18 | Disposition: A | Discharge status: Home / Self Care | Payer: Commercial Managed Care - PPO | Source: Ambulatory Visit | Attending: Obstetrics & Gynecology | Admitting: Obstetrics & Gynecology

## 2021-04-18 ENCOUNTER — Encounter: Payer: Self-pay | Admitting: Women's Health

## 2021-04-18 VITALS — BP 128/83 | HR 105 | Ht 60.0 in | Wt 251.0 lb

## 2021-04-18 DIAGNOSIS — Z319 Encounter for procreative management, unspecified: Secondary | ICD-10-CM

## 2021-04-18 DIAGNOSIS — Z01419 Encounter for gynecological examination (general) (routine) without abnormal findings: Secondary | ICD-10-CM | POA: Insufficient documentation

## 2021-04-18 DIAGNOSIS — N970 Female infertility associated with anovulation: Secondary | ICD-10-CM | POA: Diagnosis not present

## 2021-04-18 NOTE — Patient Instructions (Signed)
[facility]  . [employer], [address], [address], [phone], [email or web address] . [facility]- [facility], [address], [place] [address], [phone]

## 2021-04-18 NOTE — Progress Notes (Signed)
WELL-WOMAN EXAMINATION Patient name: Nancy Bishop MRN [id number]  Date of birth: 11/10/2001 Chief Complaint:   Annual Exam  History of Present Illness:   Nancy Bishop is a 20 y.o. G0P0 Caucasian female being seen today for a routine well-woman exam.  Current complaints: trying to conceive. Removed Nexplanon 12/7, was having regular periods, but OPKs were all neg at home, so we checked 2 day 21 progesterones and both revealed anovulatory cycles. We checked A1C (5.8) and TSH (1.370), she was started on metformin and clomid 50mg  which she took on days 3-7 of April & May cycles. She did a day 22 progesterone today (was coming today for pap, so just did today instead of yesterday).  She & her husband are interested in having his sperm analyzed as well as referral to [facility] in case we can't get her ovulating.   PCP: none      does not desire labs Patient's last menstrual period was 03/28/2021. The current method of family planning is none.  Last pap never. Results were: N/A. H/O abnormal pap: no Last mammogram: never. Results were: N/A. Family h/o breast cancer: no Last colonoscopy: never. Results were: N/A. Family h/o colorectal cancer: no  Depression screen [facility] 2/9 04/18/2021 06/07/2020 04/21/2020 08/19/2018 07/29/2018  Decreased Interest 0 0 1 0 0  Down, Depressed, Hopeless 1 0 1 0 0  PHQ - 2 Score 1 0 2 0 0  Altered sleeping 1 3 1 3  -  Tired, decreased energy 1 2 1 3  -  Change in appetite 0 0 0 0 -  Feeling bad or failure about yourself  0 0 0 0 -  Trouble concentrating 0 0 0 1 -  Moving slowly or fidgety/restless 0 0 1 0 -  Suicidal thoughts 0 0 0 0 -  PHQ-9 Score 3 5 5 7  -  Difficult doing work/chores - - Not difficult at all Not difficult at all -     GAD 7 : Generalized Anxiety Score 04/18/2021  Nervous, Anxious, on Edge 1  Control/stop worrying 1  Worry too much - different things 1  Trouble relaxing 1  Restless 1  Easily annoyed or irritable 1  Afraid - awful might happen 0   Total GAD 7 Score 6     Review of Systems:   Pertinent items are noted in HPI Denies any headaches, blurred vision, fatigue, shortness of breath, chest pain, abdominal pain, abnormal vaginal discharge/itching/odor/irritation, problems with periods, bowel movements, urination, or intercourse unless otherwise stated above. Pertinent History Reviewed:  Reviewed past medical,surgical, social and family history.  Reviewed problem list, medications and allergies. Physical Assessment:   Vitals:   04/18/21 1029  BP: 128/83  Pulse: (!) 105  Weight: 251 lb (113.9 kg)  Height: 5' (1.524 m)  Body mass index is 49.02 kg/m.        Physical Examination:   General appearance - well appearing, and in no distress  Mental status - alert, oriented to person, place, and time  Psych:  She has a normal mood and affect  Skin - warm and dry, normal color, no suspicious lesions noted  Chest - effort normal, all lung fields clear to auscultation bilaterally  Heart - normal rate and regular rhythm  Neck:  midline trachea, no thyromegaly or nodules  Breasts - breasts appear normal, no suspicious masses, no skin or nipple changes or  axillary nodes  Abdomen - soft, nontender, nondistended, no masses or organomegaly  Pelvic - VULVA: normal appearing vulva with  no masses, tenderness or lesions  VAGINA: normal appearing vagina with normal color and discharge, no lesions  CERVIX: normal appearing cervix without discharge or lesions, no CMT  Thin prep pap is done w/ reflex HR HPV cotesting  UTERUS: uterus is felt to be normal size, shape, consistency and nontender   ADNEXA: No adnexal masses or tenderness noted.  Extremities:  No swelling or varicosities noted  Chaperone: Jerilynn Mages, LPN    No results found for this or any previous visit (from the past 24 hour(s)).  Assessment & Plan:  1) Well-Woman Exam  2) Trying to conceive> has done 2 rounds of clomid 50mg , checking day 22 progesterone today (didn't  want to come yesterday as she was already coming today). On metformin for pre-diabetes/to help w/ ovulation. TSH was normal. Interested in semen analysis and referral to [facility] (will route note to [facility] to do referral). He can make appt w/ urology for semen analysis or wait until seen at [facility].  Labs/procedures today: progesterone  Mammogram: @ 20yo, or sooner if problems Colonoscopy: @ 20yo, or sooner if problems  Orders Placed This Encounter  Procedures  . Progesterone    Meds: No orders of the defined types were placed in this encounter.   Follow-up: Return in about 1 year (around 04/18/2022) for Physical.  06/18/2022 CNM, [facility] 04/18/21

## 2021-04-19 LAB — CYTOLOGY - PAP
Chlamydia: NEGATIVE
Comment: NEGATIVE
Comment: NORMAL
Diagnosis: NEGATIVE
Neisseria Gonorrhea: NEGATIVE

## 2021-04-19 LAB — PROGESTERONE: Progesterone: 1.1 ng/mL

## 2021-04-20 ENCOUNTER — Other Ambulatory Visit: Payer: Self-pay | Admitting: Women's Health

## 2021-04-20 DIAGNOSIS — N970 Female infertility associated with anovulation: Secondary | ICD-10-CM

## 2021-04-21 ENCOUNTER — Other Ambulatory Visit: Payer: Self-pay | Admitting: Women's Health

## 2021-04-21 LAB — FOLLICLE STIMULATING HORMONE: FSH: 3.8 m[IU]/mL

## 2021-04-21 LAB — PROLACTIN: Prolactin: 22.8 ng/mL (ref 4.8–23.3)

## 2021-04-21 MED ORDER — CLOMIPHENE CITRATE 50 MG PO TABS: 100.0000 mg | ORAL_TABLET | Freq: Every day | ORAL | 2 refills | Status: DC

## 2021-04-24 ENCOUNTER — Other Ambulatory Visit: Payer: Self-pay | Admitting: Women's Health

## 2021-04-25 ENCOUNTER — Other Ambulatory Visit: Payer: Self-pay | Admitting: Women's Health

## 2021-04-25 MED ORDER — CLOMIPHENE CITRATE 50 MG PO TABS: 100.0000 mg | ORAL_TABLET | Freq: Every day | ORAL | 2 refills | Status: DC

## 2021-05-02 ENCOUNTER — Other Ambulatory Visit: Payer: Self-pay | Admitting: Women's Health

## 2021-05-02 DIAGNOSIS — Z319 Encounter for procreative management, unspecified: Secondary | ICD-10-CM

## 2021-05-18 LAB — PROGESTERONE: Progesterone: 4.3 ng/mL

## 2021-07-05 IMAGING — MR MR CERVICAL SPINE W/O CM
4 of 5 series · 27 of 48 positions shown · non-contrast
Comparison: 07/16/2018 MRI cervical spine.

CLINICAL DATA: Pain

EXAM:
MRI CERVICAL AND LUMBAR SPINE WITHOUT CONTRAST
TECHNIQUE: Multiplanar and multiecho pulse sequences of the cervical spine, to
include the craniocervical junction and cervicothoracic junction,
and lumbar spine, were obtained without intravenous contrast.

[Series 5: T2 · sagittal · 3.0mm · 0.55mm/px · 6 of 15 slices shown (1 of 2)]
[im 1/15]
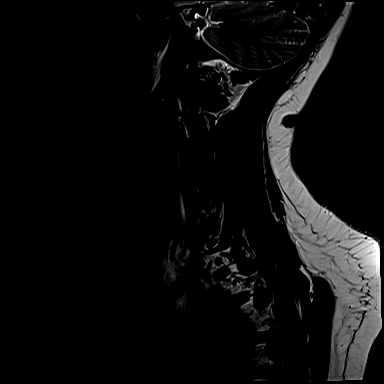
[im 3/15]
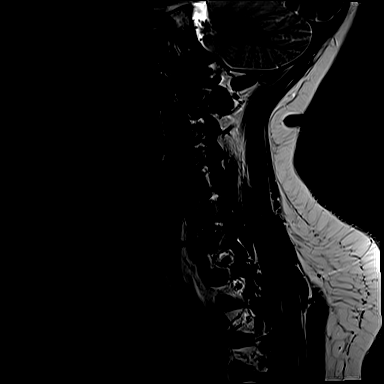
[im 6/15]
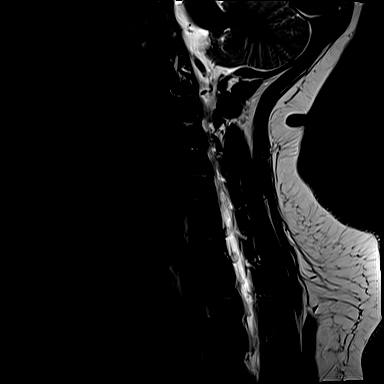
[im 9/15]
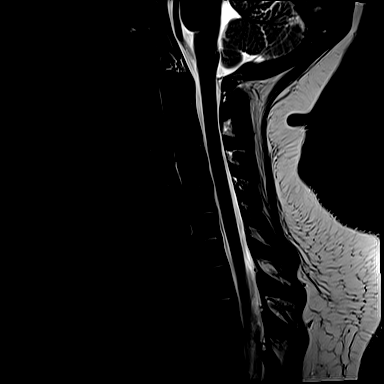
[im 12/15]
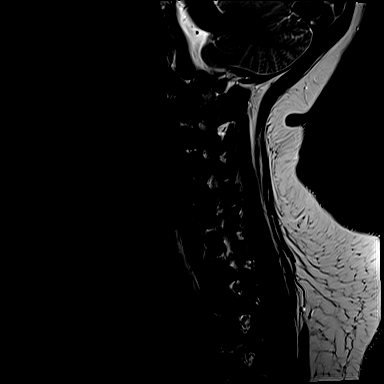
[im 15/15]
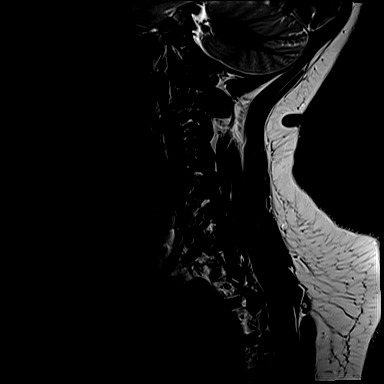

[Series 6: T1 · sagittal · 3.0mm · 0.66mm/px · 7 of 15 slices shown]
[im 1/15]
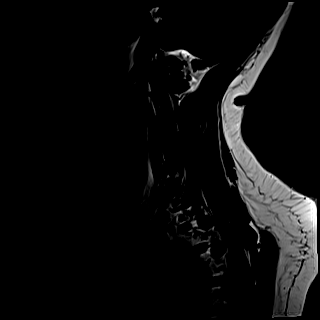
[im 3/15]
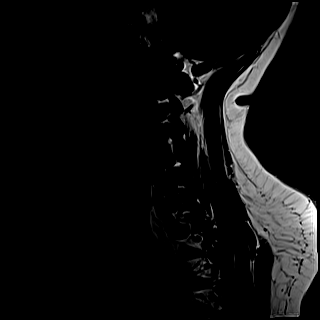
[im 5/15]
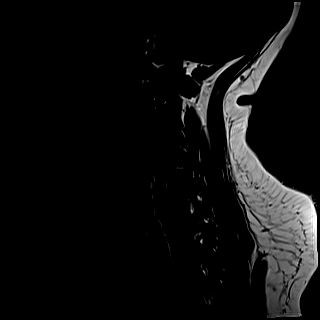
[im 8/15]
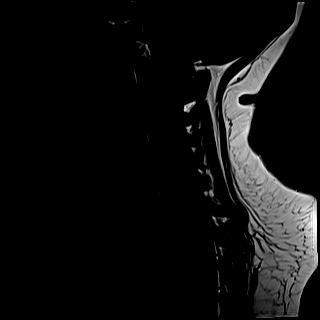
[im 10/15]
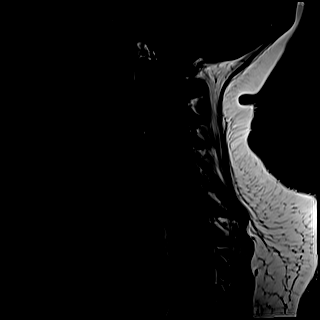
[im 12/15]
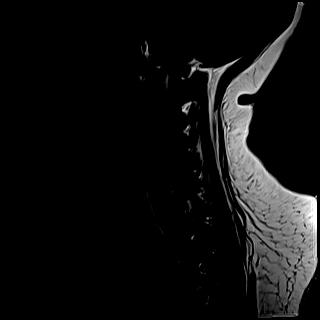
[im 15/15]
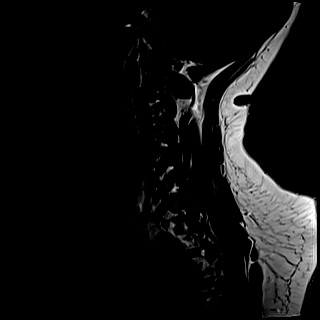

[Series 7: STIR · sagittal · 3.0mm · 0.33mm/px · 6 of 15 slices shown]
[im 1/15]
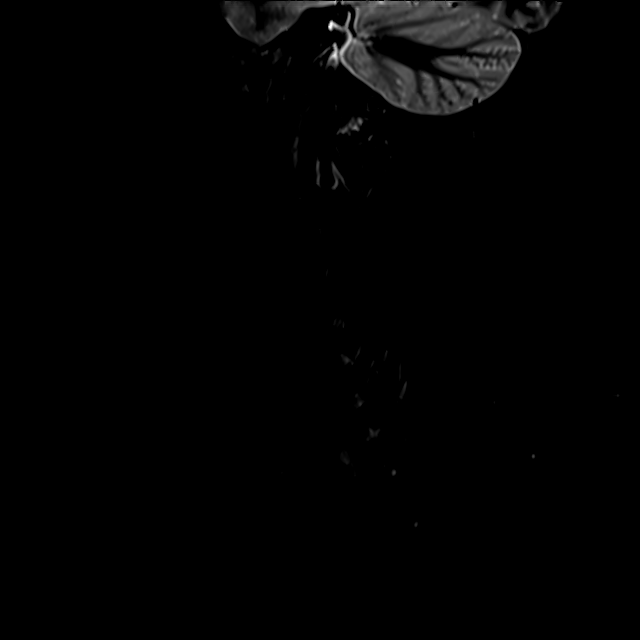
[im 3/15]
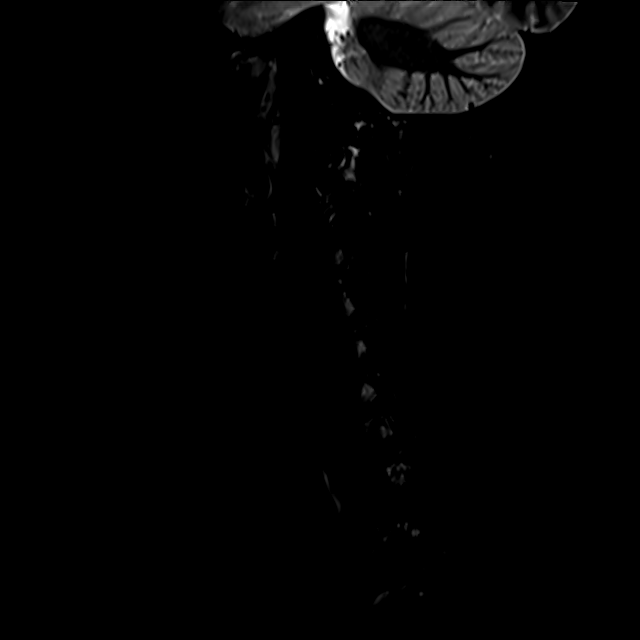
[im 5/15]
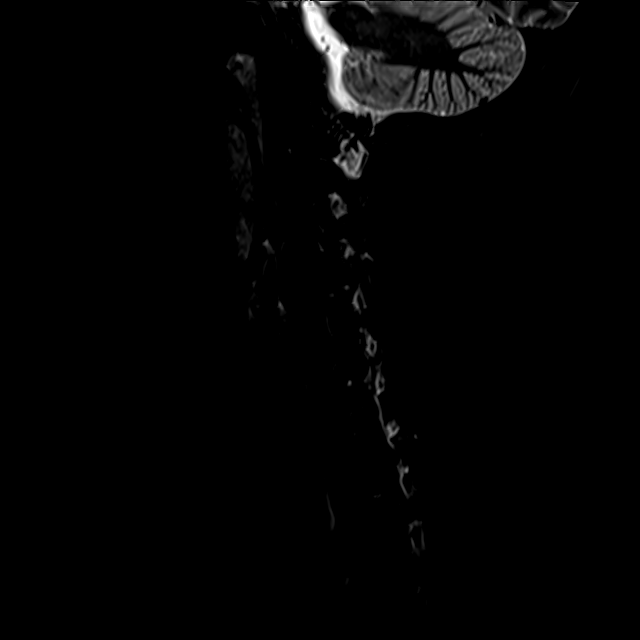
[im 8/15]
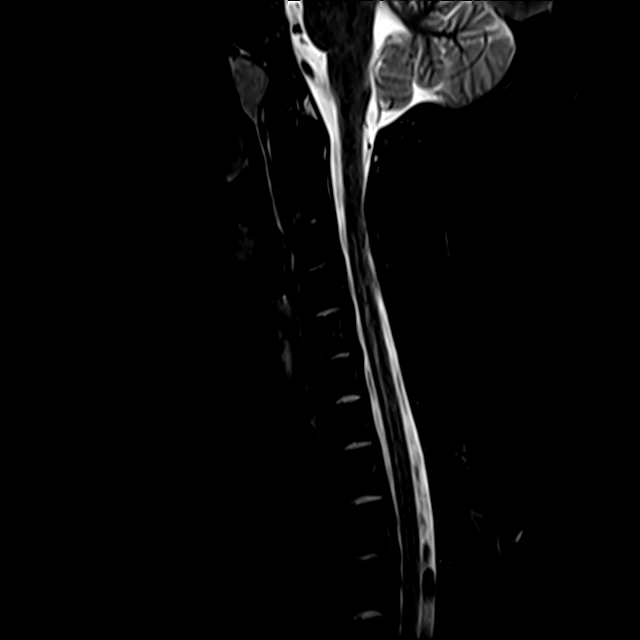
[im 10/15]
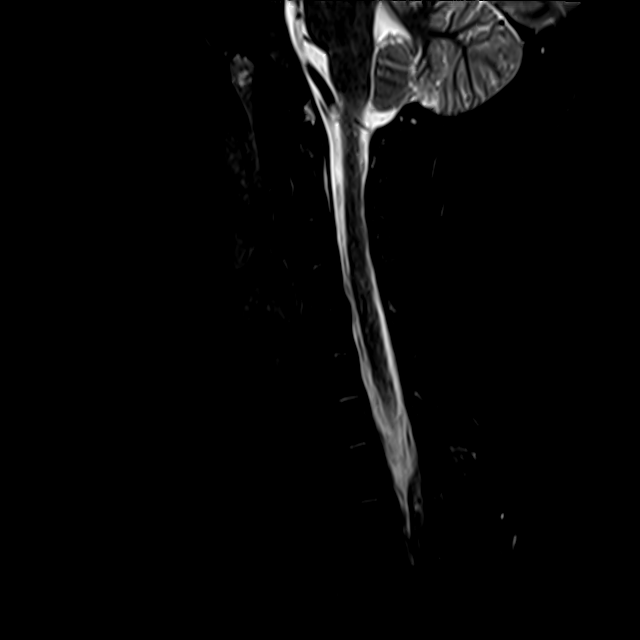
[im 12/15]
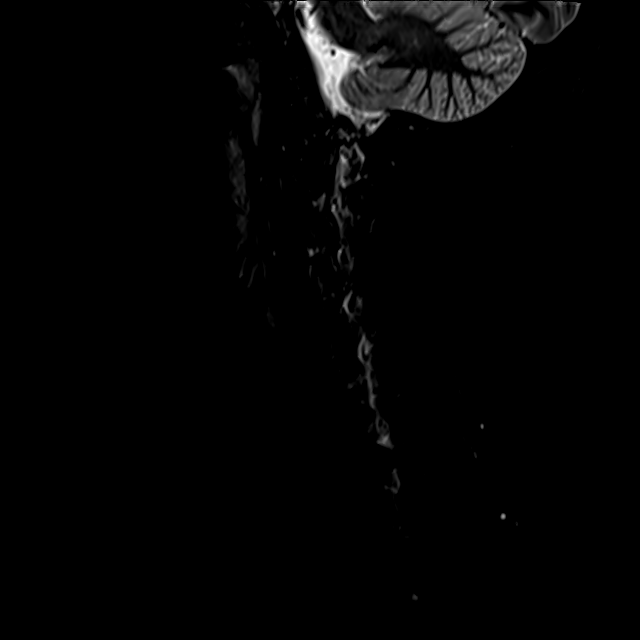

[Series 8: T2 · axial · 3.0mm · 0.50mm/px · z∈[-58,+32]mm · 8 of 30 slices shown (2 of 2)]
[im 1/30]
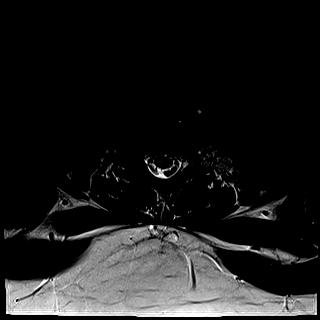
[im 5/30]
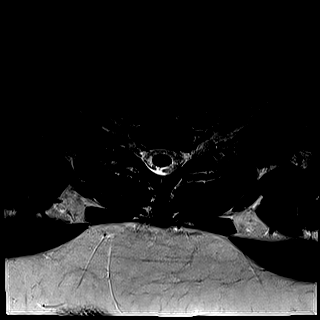
[im 9/30]
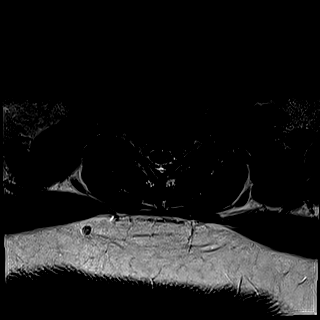
[im 14/30]
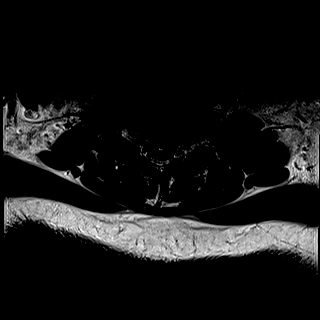
[im 16/30]
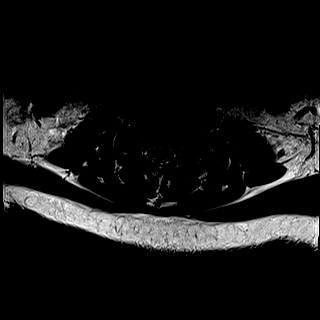
[im 21/30]
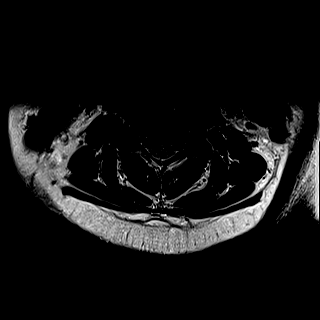
[im 25/30]
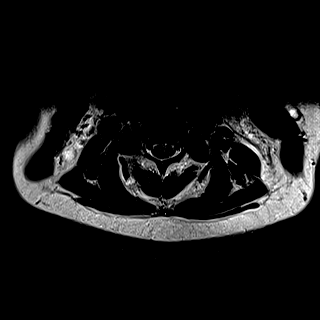
[im 30/30]
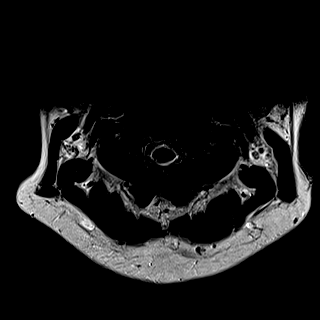

[27 of 48 positions shown; findings below may reference images not displayed]

FINDINGS: MRI CERVICAL SPINE FINDINGS

Alignment: Normal.

Vertebrae: Chronic superior T3 endplate deformity ([DATE]) is new since
6424. T1/T2 hyperintense signal underlying the superior T2 and T3
endplates may reflect early degenerative changes. No significant
vertebral body height loss.

Cord: Normal signal and morphology.

Posterior Fossa, vertebral arteries: Negative.

Disc levels: Disc spaces are preserved.

C2-3: Negative

C3-4: Negative

C4-5: Negative

C5-6: Minimal left uncovertebral degenerative spurring. No
significant spinal canal or neural foraminal narrowing.

C6-7: Negative

C7-T1: Negative

Paraspinal tissues: Negative.

MRI LUMBAR SPINE FINDINGS

Segmentation:  Standard.

Alignment:  Normal.

Vertebrae: Normal bone marrow signal intensity. No focal osseous
lesions.

Conus medullaris and cauda equina: Conus extends to the L1 level.
Conus and cauda equina appear normal.

Disc levels: Disc spaces are preserved.

L1-2: Negative

L2-3: Negative

L3-4: Negative

L4-5: Negative

L5-S1: Negative

Paraspinal and other soft tissues: Negative.
IMPRESSION: Minimal cervical and upper thoracic degenerative changes.

Chronic superior T3 endplate deformity is new since 6424.

No significant spinal canal or neural foraminal narrowing within the
cervical or lumbar spine.

## 2021-08-03 LAB — OB RESULTS CONSOLE RUBELLA ANTIBODY, IGM: Rubella: IMMUNE

## 2021-08-03 LAB — OB RESULTS CONSOLE GC/CHLAMYDIA
Chlamydia: NEGATIVE
Gonorrhea: NEGATIVE

## 2021-08-03 LAB — OB RESULTS CONSOLE HIV ANTIBODY (ROUTINE TESTING): HIV: NONREACTIVE

## 2021-08-03 LAB — OB RESULTS CONSOLE ABO/RH: RH Type: POSITIVE

## 2021-08-03 LAB — OB RESULTS CONSOLE RPR: RPR: NONREACTIVE

## 2021-08-03 LAB — HEPATITIS C ANTIBODY: HCV Ab: NEGATIVE

## 2021-08-03 LAB — OB RESULTS CONSOLE ANTIBODY SCREEN: Antibody Screen: NEGATIVE

## 2021-08-03 LAB — OB RESULTS CONSOLE HEPATITIS B SURFACE ANTIGEN: Hepatitis B Surface Ag: NEGATIVE

## 2021-08-12 DIAGNOSIS — Z419 Encounter for procedure for purposes other than remedying health state, unspecified: Secondary | ICD-10-CM | POA: Diagnosis not present

## 2021-08-28 DIAGNOSIS — B839 Helminthiasis, unspecified: Secondary | ICD-10-CM | POA: Diagnosis not present

## 2021-09-12 DIAGNOSIS — Z419 Encounter for procedure for purposes other than remedying health state, unspecified: Secondary | ICD-10-CM | POA: Diagnosis not present

## 2021-10-12 DIAGNOSIS — Z419 Encounter for procedure for purposes other than remedying health state, unspecified: Secondary | ICD-10-CM | POA: Diagnosis not present

## 2021-11-12 DIAGNOSIS — Z419 Encounter for procedure for purposes other than remedying health state, unspecified: Secondary | ICD-10-CM | POA: Diagnosis not present

## 2021-11-12 NOTE — L&D Delivery Note (Addendum)
Delivery Note ?Nancy Bishop is a G1P0 at [redacted]w[redacted]d who had a spontaneous delivery at 73 a viable female was delivered via LOA.  APGAR: 8, 9; weight 6lb 15.1oz (3150g)  .    ? ?Admitted for post dates induction. Induced with cytotec, AROM, pitocin. Received epidural for pain management. Progressed quickly once in active labor. Chorio treated with amp/gent. Pushed for 60 minutes. Baby was delivered without difficulty. No nuchal cord.  Delayed cord clamping for 60 seconds.  Delivery of placenta was spontaneous. Placenta was found to be intact, 3 -vessel cord was noted. The fundus was found to be firm. Right sulcal tear was repaired in the normal sterile fashion with 2-0 vicryl rapide.  Estimated blood loss 300cc. Instrument and gauze counts were correct at the end of the procedure. ? ? ?Placenta status: to pathology ? ?Anesthesia:  epidural ?Episiotomy:  none ?Lacerations:  right sulcal ?Suture Repair: 2.0 vicryl rapide ?Est. Blood Loss (mL):  300 ? ?Mom to postpartum.  Baby to Couplet care / Skin to Skin. ? ?Charlett Nose ?03/10/2022, 11:58 AM  ?

## 2021-11-23 ENCOUNTER — Inpatient Hospital Stay (HOSPITAL_COMMUNITY): Admission: AD | Admit: 2021-11-23 | Payer: Self-pay | Source: Home / Self Care | Admitting: Obstetrics and Gynecology

## 2021-12-13 DIAGNOSIS — Z419 Encounter for procedure for purposes other than remedying health state, unspecified: Secondary | ICD-10-CM | POA: Diagnosis not present

## 2022-01-10 DIAGNOSIS — Z419 Encounter for procedure for purposes other than remedying health state, unspecified: Secondary | ICD-10-CM | POA: Diagnosis not present

## 2022-02-10 DIAGNOSIS — Z419 Encounter for procedure for purposes other than remedying health state, unspecified: Secondary | ICD-10-CM | POA: Diagnosis not present

## 2022-02-12 LAB — OB RESULTS CONSOLE GBS: GBS: NEGATIVE

## 2022-03-01 ENCOUNTER — Encounter (HOSPITAL_COMMUNITY): Payer: Self-pay | Admitting: *Deleted

## 2022-03-01 ENCOUNTER — Telehealth (HOSPITAL_COMMUNITY): Payer: Self-pay | Admitting: *Deleted

## 2022-03-01 NOTE — Telephone Encounter (Signed)
Preadmission screen  

## 2022-03-02 ENCOUNTER — Encounter (HOSPITAL_COMMUNITY): Payer: Self-pay | Admitting: *Deleted

## 2022-03-02 ENCOUNTER — Telehealth (HOSPITAL_COMMUNITY): Payer: Self-pay | Admitting: *Deleted

## 2022-03-02 NOTE — Telephone Encounter (Signed)
Preadmission screen  

## 2022-03-04 ENCOUNTER — Inpatient Hospital Stay (HOSPITAL_COMMUNITY): Admit: 2022-03-04 | Payer: Self-pay

## 2022-03-09 ENCOUNTER — Other Ambulatory Visit: Payer: Self-pay

## 2022-03-09 ENCOUNTER — Inpatient Hospital Stay (HOSPITAL_COMMUNITY): Payer: Commercial Managed Care - PPO | Admitting: Anesthesiology

## 2022-03-09 ENCOUNTER — Inpatient Hospital Stay (HOSPITAL_COMMUNITY): Payer: Commercial Managed Care - PPO

## 2022-03-09 ENCOUNTER — Encounter (HOSPITAL_COMMUNITY): Payer: Self-pay | Admitting: Obstetrics and Gynecology

## 2022-03-09 ENCOUNTER — Inpatient Hospital Stay (HOSPITAL_COMMUNITY)
Admission: AD | Admit: 2022-03-09 | Discharge: 2022-03-12 | DRG: 805 | Disposition: A | Discharge status: Home / Self Care | Payer: Commercial Managed Care - PPO | Attending: Obstetrics and Gynecology | Admitting: Obstetrics and Gynecology

## 2022-03-09 DIAGNOSIS — O26893 Other specified pregnancy related conditions, third trimester: Secondary | ICD-10-CM | POA: Diagnosis present

## 2022-03-09 DIAGNOSIS — Z3A40 40 weeks gestation of pregnancy: Secondary | ICD-10-CM

## 2022-03-09 DIAGNOSIS — R030 Elevated blood-pressure reading, without diagnosis of hypertension: Secondary | ICD-10-CM | POA: Diagnosis present

## 2022-03-09 DIAGNOSIS — Z3490 Encounter for supervision of normal pregnancy, unspecified, unspecified trimester: Secondary | ICD-10-CM

## 2022-03-09 DIAGNOSIS — O480 Post-term pregnancy: Secondary | ICD-10-CM | POA: Diagnosis present

## 2022-03-09 DIAGNOSIS — O99214 Obesity complicating childbirth: Secondary | ICD-10-CM | POA: Diagnosis present

## 2022-03-09 DIAGNOSIS — O9081 Anemia of the puerperium: Secondary | ICD-10-CM | POA: Diagnosis not present

## 2022-03-09 DIAGNOSIS — Z419 Encounter for procedure for purposes other than remedying health state, unspecified: Secondary | ICD-10-CM | POA: Diagnosis not present

## 2022-03-09 DIAGNOSIS — O411230 Chorioamnionitis, third trimester, not applicable or unspecified: Secondary | ICD-10-CM | POA: Diagnosis present

## 2022-03-09 LAB — CBC
HCT: 39.2 % (ref 36.0–46.0)
Hemoglobin: 13.2 g/dL (ref 12.0–15.0)
MCH: 27.7 pg (ref 26.0–34.0)
MCHC: 33.7 g/dL (ref 30.0–36.0)
MCV: 82.4 fL (ref 80.0–100.0)
Platelets: 290 10*3/uL (ref 150–400)
RBC: 4.76 MIL/uL (ref 3.87–5.11)
RDW: 13.4 % (ref 11.5–15.5)
WBC: 11.2 10*3/uL — ABNORMAL HIGH (ref 4.0–10.5)
nRBC: 0 % (ref 0.0–0.2)

## 2022-03-09 LAB — TYPE AND SCREEN
ABO/RH(D): O POS
Antibody Screen: NEGATIVE

## 2022-03-09 LAB — RPR: RPR Ser Ql: NONREACTIVE

## 2022-03-09 MED ORDER — LACTATED RINGERS IV SOLN: INTRAVENOUS | Status: DC

## 2022-03-09 MED ORDER — DIPHENHYDRAMINE HCL 50 MG/ML IJ SOLN: 12.5000 mg | INTRAMUSCULAR | Status: DC | PRN

## 2022-03-09 MED ORDER — LIDOCAINE HCL (PF) 1 % IJ SOLN: 30.0000 mL | INTRAMUSCULAR | Status: DC | PRN

## 2022-03-09 MED ORDER — FENTANYL CITRATE (PF) 100 MCG/2ML IJ SOLN: 50.0000 ug | INTRAMUSCULAR | Status: DC | PRN

## 2022-03-09 MED ORDER — CALCIUM CARBONATE ANTACID 500 MG PO CHEW
1.0000 | CHEWABLE_TABLET | ORAL | Status: DC | PRN
  Administered 2022-03-09: 200 mg via ORAL
  Filled 2022-03-09: qty 1

## 2022-03-09 MED ORDER — OXYTOCIN BOLUS FROM INFUSION
333.0000 mL | Freq: Once | INTRAVENOUS | Status: AC
  Administered 2022-03-10: 333 mL via INTRAVENOUS

## 2022-03-09 MED ORDER — LACTATED RINGERS IV SOLN
500.0000 mL | INTRAVENOUS | Status: DC | PRN
  Administered 2022-03-09: 500 mL via INTRAVENOUS

## 2022-03-09 MED ORDER — ONDANSETRON HCL 4 MG/2ML IJ SOLN
4.0000 mg | Freq: Four times a day (QID) | INTRAMUSCULAR | Status: DC | PRN
  Administered 2022-03-10: 4 mg via INTRAVENOUS
  Filled 2022-03-09: qty 2

## 2022-03-09 MED ORDER — EPHEDRINE 5 MG/ML INJ: 10.0000 mg | INTRAVENOUS | Status: DC | PRN

## 2022-03-09 MED ORDER — PHENYLEPHRINE 80 MCG/ML (10ML) SYRINGE FOR IV PUSH (FOR BLOOD PRESSURE SUPPORT)
80.0000 ug | PREFILLED_SYRINGE | INTRAVENOUS | Status: DC | PRN
  Filled 2022-03-09: qty 10

## 2022-03-09 MED ORDER — OXYCODONE-ACETAMINOPHEN 5-325 MG PO TABS: 2.0000 | ORAL_TABLET | ORAL | Status: DC | PRN

## 2022-03-09 MED ORDER — ACETAMINOPHEN 325 MG PO TABS: 650.0000 mg | ORAL_TABLET | ORAL | Status: DC | PRN

## 2022-03-09 MED ORDER — OXYCODONE-ACETAMINOPHEN 5-325 MG PO TABS: 1.0000 | ORAL_TABLET | ORAL | Status: DC | PRN

## 2022-03-09 MED ORDER — LIDOCAINE HCL (PF) 1 % IJ SOLN
INTRAMUSCULAR | Status: DC | PRN
  Administered 2022-03-09: 10 mL via EPIDURAL

## 2022-03-09 MED ORDER — SOD CITRATE-CITRIC ACID 500-334 MG/5ML PO SOLN: 30.0000 mL | ORAL | Status: DC | PRN

## 2022-03-09 MED ORDER — FENTANYL-BUPIVACAINE-NACL 0.5-0.125-0.9 MG/250ML-% EP SOLN
12.0000 mL/h | EPIDURAL | Status: DC | PRN
  Administered 2022-03-09 – 2022-03-10 (×2): 12 mL/h via EPIDURAL
  Filled 2022-03-09 (×2): qty 250

## 2022-03-09 MED ORDER — EPHEDRINE 5 MG/ML INJ
10.0000 mg | INTRAVENOUS | Status: DC | PRN
Start: 2022-03-09 — End: 2022-03-10

## 2022-03-09 MED ORDER — OXYTOCIN-SODIUM CHLORIDE 30-0.9 UT/500ML-% IV SOLN
2.5000 [IU]/h | INTRAVENOUS | Status: DC
  Filled 2022-03-09 (×2): qty 500

## 2022-03-09 MED ORDER — PHENYLEPHRINE 80 MCG/ML (10ML) SYRINGE FOR IV PUSH (FOR BLOOD PRESSURE SUPPORT): 80.0000 ug | PREFILLED_SYRINGE | INTRAVENOUS | Status: DC | PRN

## 2022-03-09 MED ORDER — OXYTOCIN-SODIUM CHLORIDE 30-0.9 UT/500ML-% IV SOLN
1.0000 m[IU]/min | INTRAVENOUS | Status: DC
  Administered 2022-03-09: 2 m[IU]/min via INTRAVENOUS
  Filled 2022-03-09: qty 500

## 2022-03-09 MED ORDER — TERBUTALINE SULFATE 1 MG/ML IJ SOLN: 0.2500 mg | Freq: Once | INTRAMUSCULAR | Status: DC | PRN

## 2022-03-09 MED ORDER — FAMOTIDINE IN NACL 20-0.9 MG/50ML-% IV SOLN
20.0000 mg | Freq: Once | INTRAVENOUS | Status: AC | PRN
  Administered 2022-03-09: 20 mg via INTRAVENOUS
  Filled 2022-03-09: qty 50

## 2022-03-09 MED ORDER — LACTATED RINGERS IV SOLN
500.0000 mL | Freq: Once | INTRAVENOUS | Status: AC
  Administered 2022-03-09: 500 mL via INTRAVENOUS

## 2022-03-09 NOTE — Progress Notes (Signed)
Patient ID: Nancy Bishop, female   DOB: February 08, 2001, 21 y.o.   MRN: [id number] ?Pt getting more uncomfortable but still coping well ? ?FHR Category 1 overall ? ?Cervix 80/4/-1 small forebag ruptured ? ?Pt coping well.  Will let  us know when ready for epidural  ?Pitocin at 18 mu, contractions not tracing well, but q 2-3 min ? ?

## 2022-03-09 NOTE — Progress Notes (Signed)
Patient ID: Nancy Bishop, female   DOB: [birth year], 21 y.o.   MRN: [id number] ?Pt getting epidural ? ?Afeb VSS--occasional mildly elevated blood pressures ? ?FHR Category 1 prior to epidural ? ?Will assess progress once comfortable  ?

## 2022-03-09 NOTE — Anesthesia Procedure Notes (Signed)
Epidural ?Patient location during procedure: OB ?Start time: 03/09/2022 6:21 PM ?End time: 03/09/2022 6:31 PM ? ?Staffing ?Anesthesiologist: Lucretia Kern, MD ?Performed: anesthesiologist  ? ?Preanesthetic Checklist ?Completed: patient identified, IV checked, risks and benefits discussed, monitors and equipment checked, pre-op evaluation and timeout performed ? ?Epidural ?Patient position: sitting ?Prep: DuraPrep ?Patient monitoring: heart rate, continuous pulse ox and blood pressure ?Approach: midline ?Location: L3-L4 ?Injection technique: LOR air ? ?Needle:  ?Needle type: Tuohy  ?Needle gauge: 17 G ?Needle length: 9 cm ?Needle insertion depth: 7 cm ?Catheter type: closed end flexible ?Catheter size: 19 Gauge ?Catheter at skin depth: 12 cm ?Test dose: negative ? ?Assessment ?Events: blood not aspirated, injection not painful, no injection resistance, no paresthesia and negative IV test ? ?Additional Notes ?Reason for block:procedure for pain ? ? ? ?

## 2022-03-09 NOTE — Anesthesia Preprocedure Evaluation (Signed)
Anesthesia Evaluation  ?Patient identified by MRN, date of birth, ID band ?Patient awake ? ? ? ?Reviewed: ?Allergy & Precautions, H&P , NPO status , Patient's Chart, lab work & pertinent test results ? ?History of Anesthesia Complications ?Negative for: history of anesthetic complications ? ?Airway ?Mallampati: II ? ?TM Distance: >3 FB ? ? ? ? Dental ?  ?Pulmonary ?neg pulmonary ROS,  ?  ?Pulmonary exam normal ? ? ? ? ? ? ? Cardiovascular ?negative cardio ROS ? ? ?Rhythm:regular Rate:Normal ? ? ?  ?Neuro/Psych ?negative neurological ROS ? negative psych ROS  ? GI/Hepatic ?negative GI ROS, Neg liver ROS,   ?Endo/Other  ?Morbid obesity ? Renal/GU ?  ? ?  ?Musculoskeletal ? ? Abdominal ?  ?Peds ? Hematology ?negative hematology ROS ?(+)   ?Anesthesia Other Findings ? ? Reproductive/Obstetrics ?(+) Pregnancy ? ?  ? ? ? ? ? ? ? ? ? ? ? ? ? ?  ?  ? ? ? ? ? ? ? ? ?Anesthesia Physical ?Anesthesia Plan ? ?ASA: 3 ? ?Anesthesia Plan: Epidural  ? ?Post-op Pain Management:   ? ?Induction:  ? ?PONV Risk Score and Plan:  ? ?Airway Management Planned:  ? ?Additional Equipment:  ? ?Intra-op Plan:  ? ?Post-operative Plan:  ? ?Informed Consent: I have reviewed the patients History and Physical, chart, labs and discussed the procedure including the risks, benefits and alternatives for the proposed anesthesia with the patient or authorized representative who has indicated his/her understanding and acceptance.  ? ? ? ? ? ?Plan Discussed with:  ? ?Anesthesia Plan Comments:   ? ? ? ? ? ? ?Anesthesia Quick Evaluation ? ?
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
## 2022-03-09 NOTE — Progress Notes (Signed)
Patient ID: Nancy Bishop, female   DOB: September 03, 2001, 21 y.o.   MRN: [id number] ?Pt comfortable with epidural.  ? ?Afeb VSS ?FHR category 1 ? ?80/4-5/-1 ? ?IUPC placed to adjust pitocin ?Latent phase labor ? ?

## 2022-03-10 ENCOUNTER — Encounter (HOSPITAL_COMMUNITY): Payer: Self-pay | Admitting: Obstetrics and Gynecology

## 2022-03-10 MED ORDER — ACETAMINOPHEN 500 MG PO TABS: 1000.0000 mg | ORAL_TABLET | Freq: Once | ORAL | Status: AC

## 2022-03-10 MED ORDER — ONDANSETRON HCL 4 MG PO TABS: 4.0000 mg | ORAL_TABLET | ORAL | Status: DC | PRN

## 2022-03-10 MED ORDER — GENTAMICIN SULFATE 40 MG/ML IJ SOLN
5.0000 mg/kg | INTRAVENOUS | Status: DC
  Administered 2022-03-10: 350 mg via INTRAVENOUS
  Filled 2022-03-10: qty 8.75

## 2022-03-10 MED ORDER — IBUPROFEN 600 MG PO TABS
600.0000 mg | ORAL_TABLET | Freq: Four times a day (QID) | ORAL | Status: DC
  Administered 2022-03-10 – 2022-03-12 (×8): 600 mg via ORAL
  Filled 2022-03-10 (×8): qty 1

## 2022-03-10 MED ORDER — PANTOPRAZOLE SODIUM 40 MG PO TBEC: 40.0000 mg | DELAYED_RELEASE_TABLET | Freq: Every day | ORAL | Status: DC | PRN

## 2022-03-10 MED ORDER — DIBUCAINE (PERIANAL) 1 % EX OINT: 1.0000 "application " | TOPICAL_OINTMENT | CUTANEOUS | Status: DC | PRN

## 2022-03-10 MED ORDER — TETANUS-DIPHTH-ACELL PERTUSSIS 5-2.5-18.5 LF-MCG/0.5 IM SUSY: 0.5000 mL | PREFILLED_SYRINGE | Freq: Once | INTRAMUSCULAR | Status: DC

## 2022-03-10 MED ORDER — SIMETHICONE 80 MG PO CHEW: 80.0000 mg | CHEWABLE_TABLET | ORAL | Status: DC | PRN

## 2022-03-10 MED ORDER — ACETAMINOPHEN 325 MG PO TABS: 650.0000 mg | ORAL_TABLET | ORAL | Status: DC | PRN

## 2022-03-10 MED ORDER — BENZOCAINE-MENTHOL 20-0.5 % EX AERO
1.0000 "application " | INHALATION_SPRAY | CUTANEOUS | Status: DC | PRN
  Administered 2022-03-10: 1 via TOPICAL
  Filled 2022-03-10: qty 56

## 2022-03-10 MED ORDER — ONDANSETRON HCL 4 MG/2ML IJ SOLN: 4.0000 mg | INTRAMUSCULAR | Status: DC | PRN

## 2022-03-10 MED ORDER — SODIUM CHLORIDE 0.9 % IV SOLN
2.0000 g | Freq: Four times a day (QID) | INTRAVENOUS | Status: AC
  Administered 2022-03-10: 2 g via INTRAVENOUS
  Filled 2022-03-10: qty 2000

## 2022-03-10 MED ORDER — ACETAMINOPHEN 500 MG PO TABS
ORAL_TABLET | ORAL | Status: AC
  Administered 2022-03-10: 1000 mg via ORAL
  Filled 2022-03-10: qty 2

## 2022-03-10 MED ORDER — COCONUT OIL OIL: 1.0000 "application " | TOPICAL_OIL | Status: DC | PRN

## 2022-03-10 MED ORDER — OXYCODONE HCL 5 MG PO TABS: 5.0000 mg | ORAL_TABLET | ORAL | Status: DC | PRN

## 2022-03-10 MED ORDER — BISACODYL 10 MG RE SUPP
10.0000 mg | Freq: Every day | RECTAL | Status: DC | PRN
  Filled 2022-03-10: qty 1

## 2022-03-10 MED ORDER — FLEET ENEMA 7-19 GM/118ML RE ENEM: 1.0000 | ENEMA | Freq: Every day | RECTAL | Status: DC | PRN

## 2022-03-10 MED ORDER — PRENATAL MULTIVITAMIN CH
1.0000 | ORAL_TABLET | Freq: Every day | ORAL | Status: DC
  Administered 2022-03-11 – 2022-03-12 (×2): 1 via ORAL
  Filled 2022-03-10 (×2): qty 1

## 2022-03-10 MED ORDER — WITCH HAZEL-GLYCERIN EX PADS: 1.0000 "application " | MEDICATED_PAD | CUTANEOUS | Status: DC | PRN

## 2022-03-10 MED ORDER — OXYCODONE HCL 5 MG PO TABS: 10.0000 mg | ORAL_TABLET | ORAL | Status: DC | PRN

## 2022-03-10 MED ORDER — DIPHENHYDRAMINE HCL 25 MG PO CAPS: 25.0000 mg | ORAL_CAPSULE | Freq: Four times a day (QID) | ORAL | Status: DC | PRN

## 2022-03-10 MED ORDER — DOCUSATE SODIUM 100 MG PO CAPS
100.0000 mg | ORAL_CAPSULE | Freq: Two times a day (BID) | ORAL | Status: DC
  Administered 2022-03-11 – 2022-03-12 (×3): 100 mg via ORAL
  Filled 2022-03-10 (×3): qty 1

## 2022-03-10 MED ORDER — SODIUM CHLORIDE 0.9 % IV SOLN
2.0000 g | Freq: Four times a day (QID) | INTRAVENOUS | Status: DC
  Administered 2022-03-10: 2 g via INTRAVENOUS
  Filled 2022-03-10: qty 2000

## 2022-03-10 NOTE — Lactation Note (Signed)
This note was copied from a baby's chart. ?Lactation Consultation Note ? ?Patient Name: Nancy Bishop ?Today's Date: 03/10/2022 ?Reason for consult: Initial assessment;Primapara;1st time breastfeeding;Term;Breastfeeding assistance ?Age:23 hours ? ?P1, Term infant, female ? ?Mom stated that she just fed baby for 16 min at 2018.  ?She says that things are going well with BF.  ? ?Baby began showing cues and LC helped mom to get baby latched with more depth on her left breast in the cross cradle position.  ? ?Baby did a few suckles and fell asleep due to being fed earlier.  ? ?LC showed mom how to hand express and some colostrum was noted.  ? ?Mom is aware of LC services and support groups.  ? ?Mom's Current Feeding Plan: ?Mom will continue to put baby to the breast according to feeding cues 8-12+ times per day.  ?Mom will hand express and feed any expressed milk to baby via a spoon if baby is too sleepy or doesn't latch.  ?Mom will call RN/LC for assistance with latch if needed.  ? ? ?Maternal Data ?Has patient been taught Hand Expression?: Yes ?Does the patient have breastfeeding experience prior to this delivery?: No ?  ? ? ?Lactation Tools Discussed/Used ?  ? ?Interventions ?Interventions: Breast feeding basics reviewed;Assisted with latch;Skin to skin;Hand express;Breast compression;Support pillows;Education ? ?Discharge ?Pump: Hands Free;Personal;Manual (Per mom she has a Dealer and a Willow.) ? ?Consult Status ?Consult Status: Follow-up ?Date: 03/11/22 ?Follow-up type: In-patient ? ? ? ?Tammatha Cobb P [place], IBCLC ?03/10/2022, 9:20 PM ? ? ? ?

## 2022-03-10 NOTE — Lactation Note (Signed)
This note was copied from a baby's chart. ?Lactation Consultation Note ? ?Patient Name: Nancy Bishop ?Today's Date: 03/10/2022 ?Reason for consult: L&D Initial assessment;Primapara;1st time breastfeeding;Term;Breastfeeding assistance;Other (Comment) (LC LD visit at 35 mins PP, Baby STS, LC offered to assist BF, mom receptive. Baby awake, alert, attempted to latch, and baby latched with assist for a few sucks, and released. LC reassured mom and dad its normal, baby STS. Mom aware LC will visit later.) ?Age:23 hours ? ? ?LC Initial Consult: ? ?Attempted to visit with mother, however, visitors present.  Mother requests a visit later today; will report to evening shift and they can follow up. ? ? ?Maternal Data ?Does the patient have breastfeeding experience prior to this delivery?: No ? ?Feeding ?Mother's Current Feeding Choice: Breast Milk ? ?LATCH Score ?Latch: Repeated attempts needed to sustain latch, nipple held in mouth throughout feeding, stimulation needed to elicit sucking reflex. ? ?Audible Swallowing: A few with stimulation ? ?Type of Nipple: Everted at rest and after stimulation ? ?Comfort (Breast/Nipple): Soft / non-tender ? ?Hold (Positioning): Assistance needed to correctly position infant at breast and maintain latch. ? ?LATCH Score: 7 ? ? ?Lactation Tools Discussed/Used ?  ? ?Interventions ?Interventions: Breast feeding basics reviewed;Assisted with latch;Skin to skin;Support pillows ? ?Discharge ?  ? ?Consult Status ?Consult Status: Follow-up from L&D ?Date: 03/10/22 ?Follow-up type: In-patient ? ? ? ?Fatmata Legere R Donnie Panik ?03/10/2022, 2:31 PM ? ? ? ?

## 2022-03-10 NOTE — Lactation Note (Signed)
This note was copied from a baby's chart. ?Lactation Consultation Note ? ?Patient Name: Girl Dlisa Barnwell ?Today's Date: 03/10/2022 ?Reason for consult: L&D Initial assessment;Primapara;1st time breastfeeding;Term;Breastfeeding assistance;Other (Comment) (LC LD visit at 35 mins PP, Baby STS, LC offered to assist BF, mom receptive. Baby awake, alert, attempted to latch, and baby latched with assist for a few sucks, and released. LC reassured mom and dad its normal, baby STS. Mom aware LC will visit later.) ?Age:LC LD visit at 35 mins PP - Latch score 7 .  ?Maternal Data ?Does the patient have breastfeeding experience prior to this delivery?: No ? ?Feeding ?Mother's Current Feeding Choice: Breast Milk ? ?LATCH Score ?Latch: Grasps breast easily, tongue down, lips flanged, rhythmical sucking. ? ?Audible Swallowing: None ? ?Type of Nipple: Everted at rest and after stimulation (areola compressible) ? ?Comfort (Breast/Nipple): Soft / non-tender ? ?Hold (Positioning): Assistance needed to correctly position infant at breast and maintain latch. ? ?LATCH Score: 7 ? ? ?Lactation Tools Discussed/Used ?  ? ?Interventions ?Interventions: Breast feeding basics reviewed;Assisted with latch;Skin to skin;Hand express;Support pillows;Adjust position;Education ? ?Discharge ?  ? ?Consult Status ?Consult Status: Follow-up from L&D ?Date: 03/10/22 ?Follow-up type: In-patient ? ? ? ?Matilde Sprang Dariyah Garduno ?03/10/2022, 11:50 AM ? ? ? ?

## 2022-03-10 NOTE — Progress Notes (Signed)
OB Progress Note ? ?Called by RN re: recurrent late decelerations that did not resolve with position change or fluids. Pitocin turned off. Also temp 101.18F with increase in baseline to 160bpm.  ? ? ?O: ?Today's Vitals  ? 03/10/22 0700 03/10/22 0728 03/10/22 0730 03/10/22 0824  ?BP: 128/78     ?Pulse: 93     ?Resp: 18     ?Temp:   99.5 ?F (37.5 ?C)   ?TempSrc:      ?Weight:      ?Height:      ?PainSc:  5   0-No pain  ? ?Body mass index is 45.35 kg/m?. ? ?SVE 10/100/0 to +1 ? ? ?20Y G1P0 @ [redacted]w[redacted]d, now fully dilated. Pitocin off with resolution of recurrent late decelerations. Will leave pitocin off for the next 30 mins and if contractions space/fetal status reassuring, will restart pitocin at that time. Plan to labor down ~ 1 hour given patient's history of hip dislocation.  ?Start amp/gent for chorio.  ? ?M. Timothy Lasso, MD 03/10/22 8:50 AM  ? ?

## 2022-03-10 NOTE — Progress Notes (Signed)
Patient ID: Nancy Bishop, female   DOB: 12-Oct-2001, 21 y.o.   MRN: [id number] ?Pt feeling some intermittent contraction pain anteriorly, but resolves with PCA bolus ? ?FHR had a run of recurrent variables/lates that resolved with repositioning from left to right ? ?Cervix 90/7-8/0 ? ?Pt finally in active phase labor with a good cervical change over last 2 hours, baby still may be OP ? ? ? ?

## 2022-03-10 NOTE — Progress Notes (Signed)
Patient ID: Nancy Bishop, female   DOB: [birth year], 21 y.o.   MRN: [id number] ?Pt comfortable with epidural ? ?Afeb VSS ?FHR short term variability and mild early decels ? ?80/5/-1 to 0 ? ?Baby rotating from OP ?Hopefully entering active phase labor ?

## 2022-03-10 NOTE — Progress Notes (Signed)
Patient ID: Nancy Bishop, female   DOB: 03/31/2001, 21 y.o.   MRN: [id number] ?Pt overall comfortable with epidural, felt some pressure anteriorly, but resolved with PCA dose ? ?Afeb VSS ?FHR baseline 150 with good variability and some accels, intermittent early decels ?Contractions adequate ? ?90/5-6/0 with contraction  ?Head still feels a little asynclitic ? ?Pt had a run of tachysystole at 24 mu pitocin so turned back to 20 mu and remains adequate ?Pt has made some slight cervical change and baby tolerating labor fine ?D/w pt latent phase vs arrest of dilation, agree to recheck in 2 hours ?Placed in left exaggerated Sims ? ? ?

## 2022-03-11 LAB — CBC
HCT: 30.1 % — ABNORMAL LOW (ref 36.0–46.0)
Hemoglobin: 9.8 g/dL — ABNORMAL LOW (ref 12.0–15.0)
MCH: 27.5 pg (ref 26.0–34.0)
MCHC: 32.6 g/dL (ref 30.0–36.0)
MCV: 84.6 fL (ref 80.0–100.0)
Platelets: 212 10*3/uL (ref 150–400)
RBC: 3.56 MIL/uL — ABNORMAL LOW (ref 3.87–5.11)
RDW: 13.8 % (ref 11.5–15.5)
WBC: 20.7 10*3/uL — ABNORMAL HIGH (ref 4.0–10.5)
nRBC: 0 % (ref 0.0–0.2)

## 2022-03-11 MED ORDER — FERROUS SULFATE 325 (65 FE) MG PO TABS
325.0000 mg | ORAL_TABLET | Freq: Every day | ORAL | Status: DC
Start: 2022-03-11 — End: 2022-03-12
  Administered 2022-03-11 – 2022-03-12 (×2): 325 mg via ORAL
  Filled 2022-03-11 (×2): qty 1

## 2022-03-11 NOTE — Progress Notes (Signed)
Post Partum Day 1 ?Subjective: ?Nancy Bishop is doing well this morning, tired from overnight. Her pain is controlled. She is ambulating, voiding, tolerating PO. Minimal lochia. Working with LC on breastfeeding.  ? ?Objective: ?Patient Vitals for the past 24 hrs: ? BP Temp Temp src Pulse Resp SpO2  ?03/11/22 0200 (!) 117/57 97.9 ?F (36.6 ?C) Oral 77 18 100 %  ?03/10/22 2200 106/64 98 ?F (36.7 ?C) -- 78 18 99 %  ?03/10/22 1820 -- 98.8 ?F (37.1 ?C) Oral 74 16 100 %  ?03/10/22 1410 114/70 98.7 ?F (37.1 ?C) Oral 69 18 100 %  ?03/10/22 1306 117/63 98.4 ?F (36.9 ?C) Oral 64 18 100 %  ?03/10/22 1225 125/74 -- -- 76 -- --  ?03/10/22 1217 -- -- -- 74 -- --  ?03/10/22 1202 (!) 153/80 -- -- 65 -- --  ?03/10/22 1146 (!) 146/89 -- -- 85 -- --  ?03/10/22 1132 125/74 -- -- (!) 102 -- --  ?03/10/22 1119 (!) 144/74 -- -- 79 -- --  ? ? ?Physical Exam:  ?General: alert, cooperative, and no distress ?Lochia: appropriate ?Uterine Fundus: firm ?DVT Evaluation: No evidence of DVT seen on physical exam. ? ?Recent Labs  ?  03/09/22 ?1025 03/11/22 ?0441  ?WBC 11.2* 20.7*  ?HGB 13.2 9.8*  ?HCT 39.2 30.1*  ?PLT 290 212  ? ? ?No results for input(s): NA, K, CL, CO2CT, BUN, CREATININE, GLUCOSE, BILITOT, ALT, AST, ALKPHOS, PROT, ALBUMIN in the last 72 hours. ? ?No results for input(s): CALCIUM, MG, PHOS in the last 72 hours. ? ?No results for input(s): PROTIME, APTT, INR in the last 72 hours. ? ?No results for input(s): PROTIME, APTT, INR, FIBRINOGEN in the last 72 hours. ?Assessment/Plan: ?  ?Nancy Bishop 20 y.o. G1P1001 PPD#1 sp SVD ?1. PPC: routine PP Care ?2. ABLA: Po iron ?3. Chorioamnionitis: s/p amp and gent, has been afebrile > 24 hours ?4. Dispo: anticipate discharge home tomorrow ? ? LOS: 2 days  ? ?Charlett Nose ?03/11/2022, 10:51 AM  ? ?

## 2022-03-11 NOTE — Anesthesia Postprocedure Evaluation (Signed)
Anesthesia Post Note ? ?Patient: Nancy Bishop ? ?Procedure(s) Performed: AN AD HOC LABOR EPIDURAL ? ?  ? ?Patient location during evaluation: Mother Baby ?Anesthesia Type: Epidural ?Level of consciousness: awake and alert ?Pain management: pain level controlled ?Vital Signs Assessment: post-procedure vital signs reviewed and stable ?Respiratory status: spontaneous breathing, nonlabored ventilation and respiratory function stable ?Cardiovascular status: stable ?Postop Assessment: no headache, no backache, epidural receding, no apparent nausea or vomiting, patient able to bend at knees, adequate PO intake and able to ambulate ?Anesthetic complications: no ? ? ?No notable events documented. ? ?Last Vitals:  ?Vitals:  ? 03/10/22 2200 03/11/22 0200  ?BP: 106/64 (!) 117/57  ?Pulse: 78 77  ?Resp: 18 18  ?Temp: 36.7 ?C 36.6 ?C  ?SpO2: 99% 100%  ?  ?Last Pain:  ?Vitals:  ? 03/11/22 0200  ?TempSrc: Oral  ?PainSc:   ? ?Pain Goal:   ? ?  ?  ?  ?  ?  ?  ?  ? ?Martin Belling Hristova ? ? ? ? ?

## 2022-03-11 NOTE — Lactation Note (Signed)
This note was copied from a baby's chart. ?Lactation Consultation Note ? ?Patient Name: Nancy Bishop ?Today's Date: 03/11/2022 ?Reason for consult: Mother's request;Difficult latch;Follow-up assessment;1st time breastfeeding;Term ?Age:23 hours ?Per mom, since 2100 pm infant has not been latching well at the breast, mom requested LC assistance with latching infant at the breast. ?Infant was fussy, mom hand expressed 2 mls of colostrum and spoon fed infant to calm infant down. ?Mom did breast stimulation prior to latching infant to help evert nipple shaft out more, mom latched infant on her right breast using the football hold position, infant was on and off the breast for 15 minutes. ?Mom was given breast shells to wear in her bra during the day to help evert nipple shaft out more and pre-pump breast prior to latching infant. ?Mom will continue to breastfeed infant according to  hunger cues, on demand, 8 to 12+ times, skin to skin. ?Mom will continue to work towards latching infant at breast and will ask RN/LC for further latch assistance if needed. ?Maternal Data ?  ? ?Feeding ?Mother's Current Feeding Choice: Breast Milk ? ?LATCH Score ?Latch: Repeated attempts needed to sustain latch, nipple held in mouth throughout feeding, stimulation needed to elicit sucking reflex. ? ?Audible Swallowing: A few with stimulation ? ?Type of Nipple: Everted at rest and after stimulation (Mom is short shafted and breast flattens when stimulated .) ? ?Comfort (Breast/Nipple): Soft / non-tender ? ?Hold (Positioning): Assistance needed to correctly position infant at breast and maintain latch. ? ?LATCH Score: 7 ? ? ?Lactation Tools Discussed/Used ?Tools: Shells;Pump ?Breast pump type: Manual ?Pump Education: Setup, frequency, and cleaning;Milk Storage ?Reason for Pumping: pre-pump breast prior to latching infant. ?Pumping frequency: pre-pump prior to latching infant at the breast ? ?Interventions ?Interventions: Skin to  skin;Assisted with latch;Breast compression;Adjust position;Support pillows;Position options;Expressed milk;Education;Pre-pump if needed ? ?Discharge ?  ? ?Consult Status ?Consult Status: Follow-up ?Date: 03/11/22 ?Follow-up type: In-patient ? ? ? ?Danelle Earthly ?03/11/2022, 2:12 AM ? ? ? ?

## 2022-03-12 NOTE — Progress Notes (Signed)
Patient is doing well.  She is ambulating, voiding, tolerating PO.  Pain control is good.  Lochia is appropriate ? ?Vitals:  ? 03/11/22 0200 03/11/22 1625 03/11/22 2041 03/12/22 0544  ?BP: (!) 117/57 117/78 113/76 107/69  ?Pulse: 77 88 94 89  ?Resp: 18 18 20 16   ?Temp: 97.9 ?F (36.6 ?C) 97.9 ?F (36.6 ?C) 97.8 ?F (36.6 ?C) 98 ?F (36.7 ?C)  ?TempSrc: Oral Oral Oral Oral  ?SpO2: 100%   100%  ?Weight:      ?Height:      ? ? ?NAD, feeding baby actively ?Fundus firm ? ? ?Lab Results  ?Component Value Date  ? WBC 20.7 (H) 03/11/2022  ? HGB 9.8 (L) 03/11/2022  ? HCT 30.1 (L) 03/11/2022  ? MCV 84.6 03/11/2022  ? PLT 212 03/11/2022  ? ? ?--/--/O POS (04/28 0848) ? ?A/P 21 y.o. G1P1001 PPD#2 s/p TSVD. ?Routine care.   ?Chorioamnionitis: s/p amp/gent.  Afebrile since delivery ?Meeting all goals.  Discharge to home today. ? ? ? ?Jasmon Mattice GEFFEL Beonca Gibb ? ?

## 2022-03-12 NOTE — Discharge Instructions (Signed)
You may take motrin (ibuprofen / advil) 600mg  every 6 hours as needed for pain.  If not controlled, you may add acetaminophen (tylenol) 1000mg  every 6 hours as well ?

## 2022-03-12 NOTE — Discharge Summary (Signed)
? ?  Postpartum Discharge Summary ? ?Date of Service updated 03/12/22 ? ?   ?Patient Name: Nancy Bishop ?DOB: 2001-06-04 ?MRN: [id number] ? ?Date of admission: 03/09/2022 ?Delivery date:03/10/2022  ?Delivering provider: Derl Barrow E  ?Date of discharge: 03/12/2022 ? ?Admitting diagnosis: Term pregnancy [Z34.90] ?Intrauterine pregnancy: [redacted]w[redacted]d     ?Secondary diagnosis:  Principal Problem: ?  Term pregnancy ? ?Additional problems: Chorioamnionitis    ?Discharge diagnosis: Term Pregnancy Delivered                                              ?Post partum procedures: none ?Augmentation: AROM and Pitocin ?Complications: Intrauterine Inflammation or infection (Chorioamniotis) ? ?Hospital course: Induction of Labor With Vaginal Delivery   ?21 y.o. yo G1P1001 at [redacted]w[redacted]d was admitted to the hospital 03/09/2022 for induction of labor.  Indication for induction: Postdates.  Patient had an uncomplicated labor course as follows: ?Membrane Rupture Time/Date: 11:06 AM ,03/09/2022   ?Delivery Method:Vaginal, Spontaneous  ?Episiotomy: None  ?Lacerations:  1st degree;Sulcus  ?Details of delivery can be found in separate delivery note.  Prior to delivery she was diagnosed with chorioamnionitis (maternal fever and fetal tachycardia).  She was started on ampicillin and gentamicin.  Patient had a routine postpartum course. Fever resolved with delivery. Patient is discharged home 03/12/22. ? ?Newborn Data: ?Birth date:03/10/2022  ?Birth time:10:52 AM  ?Gender:Female  ?Living status:Living  ?Apgars:8 ,9  ?Weight:3150 g  ? ? ? ?Physical exam  ?Vitals:  ? 03/11/22 0200 03/11/22 1625 03/11/22 2041 03/12/22 0544  ?BP: (!) 117/57 117/78 113/76 107/69  ?Pulse: 77 88 94 89  ?Resp: 18 18 20 16   ?Temp: 97.9 ?F (36.6 ?C) 97.9 ?F (36.6 ?C) 97.8 ?F (36.6 ?C) 98 ?F (36.7 ?C)  ?TempSrc: Oral Oral Oral Oral  ?SpO2: 100%   100%  ?Weight:      ?Height:      ? ?General: alert, cooperative, and no distress ?Lochia: appropriate ?Uterine Fundus: firm ?Incision:  N/A ?DVT Evaluation: No evidence of DVT seen on physical exam. ?Labs: ?Lab Results  ?Component Value Date  ? WBC 20.7 (H) 03/11/2022  ? HGB 9.8 (L) 03/11/2022  ? HCT 30.1 (L) 03/11/2022  ? MCV 84.6 03/11/2022  ? PLT 212 03/11/2022  ? ?   ? View : No data to display.  ?  ?  ?  ? ?Edinburgh Score: ?   ? View : No data to display.  ?  ?  ?  ? ? ? ? ?After visit meds:  ?Allergies as of 03/12/2022   ? ?   Reactions  ? Bee Venom Hives  ? 8-10 inch raised area  ? Latex Itching  ? Tape Itching  ? Please use paper tape  ? ?  ? ?  ?Medication List  ?  ? ?TAKE these medications   ? ?pantoprazole 40 MG tablet ?Commonly known as: PROTONIX ?Take 40 mg by mouth daily as needed (acid reflux/heartburn). ?  ?prenatal multivitamin Tabs tablet ?Take 1 tablet by mouth 3 (three) times a week. ?  ? ?  ? ? ? ?Discharge home in stable condition ?Infant Feeding: Breast ?Infant Disposition:home with mother ?Discharge instruction: per After Visit Summary and Postpartum booklet. ?Activity: Advance as tolerated. Pelvic rest for 6 weeks.  ?Diet: routine diet ?Anticipated Birth Control: Unsure ?Postpartum Appointment:6 weeks ? ?Future Appointments:No future appointments. ?Follow up Visit: ? Follow-up Information   ? ?  Huel Cote, MD Follow up in 6 week(s).   ?Specialty: Obstetrics and Gynecology ?Contact information: ?[address] ?[address] ?[place] [address] ?[phone] ? ? ?  ?  ? ?  ?  ? ?  ? ? ? ?  ? ?03/12/2022 ?Noel Henandez GEFFEL Chestine Spore, MD ? ? ?

## 2022-03-12 NOTE — Lactation Note (Signed)
This note was copied from a baby's chart. ?Lactation Consultation Note ? ?Patient Name: Nancy Bishop ?Today's Date: 03/12/2022 ?Reason for consult: Follow-up assessment;1st time breastfeeding;Primapara;Term;Infant weight loss ?Age:23 hours ? ? ?P1 mother whose infant is now 57 hours old.  This is a term baby at 40+6 weeks.  Mother's current feeding preference is breast/donor breast milk.  Baby has a 10% weight loss this morning. ? ?Mother has been hand expressing and feeding back colostrum.  She reported a good feeding with the last session. LATCH score was an 8; multiple voids/stools.  Baby supplemented with 15 mls of donor breast milk.  Encouraged to feed at least every three hours due to weight loss and to supplement with 30+ mls at every feeding.  Mother verbalized understanding.  Mother is not interested in pumping in the hospital or at home right now.  She desires to breast feed only once baby's weight stabilizes.  Mother aware to call for latch assist as desired. ? ?Father present.  RN and NP updated.  Mother has a follow up pediatric appointment already scheduled for tomorrow. ? ? ?Maternal Data ?Has patient been taught Hand Expression?: Yes ?Does the patient have breastfeeding experience prior to this delivery?: No ? ?Feeding ?Mother's Current Feeding Choice: Breast Milk and Donor Milk ?Nipple Type: Slow - flow ? ?LATCH Score ?  ? ?  ? ?  ? ?  ? ?  ? ?  ? ? ?Lactation Tools Discussed/Used ?Tools: Shells;Pump ?Breast pump type: Manual ?Reason for Pumping: Nipple eversion ?Pumping frequency: Prior to feeding and prn ? ?Interventions ?Interventions: Breast feeding basics reviewed;Education ? ?Discharge ?Pump: Manual;Hands Free ?WIC Program: Yes ? ?Consult Status ?Consult Status: Follow-up ?Date: 03/13/22 ?Follow-up type: In-patient ? ? ? ?[name] ?03/12/2022, 11:30 AM ? ? ? ?

## 2022-03-13 ENCOUNTER — Encounter: Payer: Self-pay | Admitting: Women's Health

## 2022-03-13 LAB — SURGICAL PATHOLOGY

## 2022-03-19 ENCOUNTER — Telehealth (HOSPITAL_COMMUNITY): Payer: Self-pay | Admitting: *Deleted

## 2022-03-19 DIAGNOSIS — R633 Feeding difficulties, unspecified: Secondary | ICD-10-CM | POA: Diagnosis not present

## 2022-03-19 NOTE — Telephone Encounter (Signed)
Mom reports feeling good. No concerns about herself at this time. EPDS=4 [facility] score=5) ?Mom reports baby is doing well. Feeding, peeing, and pooping without difficulty. Safe sleep reviewed. Mom reports no concerns about baby at present. ? ?Duffy Rhody, RN 03-19-2022 at 3:05pm ?

## 2022-04-12 DIAGNOSIS — Z419 Encounter for procedure for purposes other than remedying health state, unspecified: Secondary | ICD-10-CM | POA: Diagnosis not present

## 2022-05-12 DIAGNOSIS — Z419 Encounter for procedure for purposes other than remedying health state, unspecified: Secondary | ICD-10-CM | POA: Diagnosis not present

## 2022-06-12 DIAGNOSIS — Z419 Encounter for procedure for purposes other than remedying health state, unspecified: Secondary | ICD-10-CM | POA: Diagnosis not present

## 2022-07-13 DIAGNOSIS — Z419 Encounter for procedure for purposes other than remedying health state, unspecified: Secondary | ICD-10-CM | POA: Diagnosis not present

## 2022-08-12 DIAGNOSIS — Z419 Encounter for procedure for purposes other than remedying health state, unspecified: Secondary | ICD-10-CM | POA: Diagnosis not present

## 2022-09-12 DIAGNOSIS — Z419 Encounter for procedure for purposes other than remedying health state, unspecified: Secondary | ICD-10-CM | POA: Diagnosis not present

## 2022-10-12 DIAGNOSIS — Z419 Encounter for procedure for purposes other than remedying health state, unspecified: Secondary | ICD-10-CM | POA: Diagnosis not present

## 2022-10-15 ENCOUNTER — Ambulatory Visit (INDEPENDENT_AMBULATORY_CARE_PROVIDER_SITE_OTHER): Payer: Commercial Managed Care - PPO | Admitting: Family Medicine

## 2022-10-15 ENCOUNTER — Encounter: Payer: Self-pay | Admitting: Family Medicine

## 2022-10-15 VITALS — BP 108/77 | HR 109 | Temp 109.0°F | Ht 59.5 in | Wt 248.8 lb

## 2022-10-15 DIAGNOSIS — F419 Anxiety disorder, unspecified: Secondary | ICD-10-CM | POA: Insufficient documentation

## 2022-10-15 DIAGNOSIS — L659 Nonscarring hair loss, unspecified: Secondary | ICD-10-CM | POA: Diagnosis not present

## 2022-10-15 DIAGNOSIS — Z862 Personal history of diseases of the blood and blood-forming organs and certain disorders involving the immune mechanism: Secondary | ICD-10-CM

## 2022-10-15 DIAGNOSIS — F32A Depression, unspecified: Secondary | ICD-10-CM | POA: Insufficient documentation

## 2022-10-15 MED ORDER — SERTRALINE HCL 50 MG PO TABS: 50.0000 mg | ORAL_TABLET | Freq: Every day | ORAL | 1 refills | Status: DC

## 2022-10-15 NOTE — Assessment & Plan Note (Signed)
Discussed counseling/therapy.  Discussed pharmacotherapy.  Patient elected for pursuing just pharmacotherapy.  Starting Zoloft.  Follow-up in 6 weeks.  Discussed reasons to seek care sooner.

## 2022-10-15 NOTE — Progress Notes (Signed)
Subjective:  Patient ID: Nancy Bishop, female    DOB: 07/11/2001  Age: 21 y.o. MRN: [id number]  CC: Chief Complaint  Patient presents with   Establish Care    Pt arrives to est care-no PCP previously. Pt states she has always had anxiety but after having daughter anxiety has worsened.     HPI:  21 year old female presents for evaluation of the above.  Patient states that she has had a longstanding history of anxiety.  This has been amplified and worse since having her child.  Child is 68 months old.  She states that she has had some prior relationships which have led to some trauma.  However, she is married and has a good relationship with her husband.  She states that she is constantly fearful about things that may happen.  This makes her very anxious.  Patient states that she is having difficulty as a result.  She would like to discuss this today.  Additionally, PHQ-9 score of 10 today.  Patient would like labs to be obtained.  She is concerned that she has MTHFR mutation.  She has been taking a supplement.  Patient states that she has had issues with hair loss and weight gain.  She is concerned about hypothyroidism as well.  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   Hypermobility syndrome 10/22/2016    Social Hx   Social History   Socioeconomic History   Marital status: Married    Spouse name: Lucille Witts   Number of children: Not on file   Years of education: Not on file   Highest education level: Some college, no degree  Occupational History    Employer: [employer]  Tobacco Use   Smoking status: Never   Smokeless tobacco: Never  Vaping Use   Vaping Use: Never used  Substance and Sexual Activity   Alcohol use: Not Currently    Comment: occas   Drug use: Never   Sexual activity: Yes    Birth control/protection: None  Other Topics Concern   Not on file   Social History Narrative   ** Merged History Encounter **       Right handed  Caffeine- None  Live's at home with mom and dad     Social Determinants of Health   Financial Resource Strain: Low Risk  (04/18/2021)   Overall Financial Resource Strain (CARDIA)    Difficulty of Paying Living Expenses: Not very hard  Food Insecurity: No Food Insecurity (04/18/2021)   Hunger Vital Sign    Worried About Running Out of Food in the Last Year: Never true    [place] in the Last Year: Never true  Transportation Needs: No Transportation Needs (04/18/2021)   PRAPARE - Hydrologist (Medical): No    Lack of Transportation (Non-Medical): No  Physical Activity: Inactive (04/18/2021)   Exercise Vital Sign    Days of Exercise per Week: 0 days    Minutes of Exercise per Session: 0 min  Stress: Stress Concern Present (04/18/2021)   [place]    Feeling of Stress : To some extent  Social Connections: Socially Integrated (04/18/2021)   Social Connection and Isolation Panel [NHANES]    Frequency of Communication with Friends and Family: More than three times a week    Frequency of Social Gatherings with Friends  and Family: Once a week    Attends Religious Services: More than 4 times per year    Active Member of Clubs or Organizations: Yes    Attends Archivist Meetings: 1 to 4 times per year    Marital Status: Married    Review of Systems Per HPI  Objective:  BP 108/77   Pulse (!) 109   Temp (!) 109 F (42.8 C)   Ht 4' 11.5" (1.511 m)   Wt 248 lb 12.8 oz (112.9 kg)   SpO2 98%   BMI 49.41 kg/m      10/15/2022    1:45 PM 03/12/2022    5:44 AM 03/11/2022    8:41 PM  BP/Weight  Systolic BP [phone]  Diastolic BP 77 69 76  Wt. (Lbs) 248.8    BMI 49.41 kg/m2      Physical Exam Vitals and nursing note reviewed.  Constitutional:      General: She is not in acute distress.     Appearance: Normal appearance. She is obese.  HENT:     Head: Normocephalic and atraumatic.  Cardiovascular:     Rate and Rhythm: Normal rate and regular rhythm.  Pulmonary:     Effort: Pulmonary effort is normal.     Breath sounds: Normal breath sounds. No wheezing, rhonchi or rales.  Neurological:     Mental Status: She is alert.  Psychiatric:        Behavior: Behavior normal.     Comments: Anxious.     Lab Results  Component Value Date   WBC 20.7 (H) 03/11/2022   HGB 9.8 (L) 03/11/2022   HCT 30.1 (L) 03/11/2022   PLT 212 03/11/2022   GLUCOSE 104 (H) 12/21/2018   ALT 19 12/21/2018   AST 19 12/21/2018   NA 139 12/21/2018   K 4.0 12/21/2018   CL 101 12/21/2018   CREATININE 0.72 12/21/2018   BUN 6 12/21/2018   CO2 25 12/21/2018   TSH 1.370 02/23/2021   HGBA1C 5.8 (H) 02/23/2021     Assessment & Plan:   Problem List Items Addressed This Visit       Other   Anxiety and depression - Primary    Discussed counseling/therapy.  Discussed pharmacotherapy.  Patient elected for pursuing just pharmacotherapy.  Starting Zoloft.  Follow-up in 6 weeks.  Discussed reasons to seek care sooner.      Relevant Medications   sertraline (ZOLOFT) 50 MG tablet   Other Visit Diagnoses     History of anemia       Relevant Orders   CBC   Vitamin B12   Folate   Hair loss       Relevant Orders   TSH   Morbid obesity ([place])       Relevant Orders   CMP14+EGFR   Lipid panel       Meds ordered this encounter  Medications   sertraline (ZOLOFT) 50 MG tablet    Sig: Take 1 tablet (50 mg total) by mouth daily. After 1 week may increase to 100 mg daily.    Dispense:  90 tablet    Refill:  1    Follow-up:  6 weeks  [place]

## 2022-10-15 NOTE — Patient Instructions (Addendum)
Medication as prescribed.  Follow up in 6 weeks.  Call with concerns.  Labs ordered.   Take care  Dr. Adriana Simas

## 2022-10-16 LAB — CMP14+EGFR
ALT: 21 IU/L (ref 0–32)
AST: 18 IU/L (ref 0–40)
Albumin/Globulin Ratio: 1.5 (ref 1.2–2.2)
Albumin: 4.5 g/dL (ref 4.0–5.0)
Alkaline Phosphatase: 98 IU/L (ref 44–121)
BUN/Creatinine Ratio: 11 (ref 9–23)
BUN: 7 mg/dL (ref 6–20)
Bilirubin Total: 0.3 mg/dL (ref 0.0–1.2)
CO2: 25 mmol/L (ref 20–29)
Calcium: 10.3 mg/dL — ABNORMAL HIGH (ref 8.7–10.2)
Chloride: 100 mmol/L (ref 96–106)
Creatinine, Ser: 0.64 mg/dL (ref 0.57–1.00)
Globulin, Total: 3 g/dL (ref 1.5–4.5)
Glucose: 113 mg/dL — ABNORMAL HIGH (ref 70–99)
Potassium: 4.1 mmol/L (ref 3.5–5.2)
Sodium: 139 mmol/L (ref 134–144)
Total Protein: 7.5 g/dL (ref 6.0–8.5)
eGFR: 127 mL/min/1.73

## 2022-10-16 LAB — CBC
Hematocrit: 45.3 % (ref 34.0–46.6)
Hemoglobin: 14.5 g/dL (ref 11.1–15.9)
MCH: 26.6 pg (ref 26.6–33.0)
MCHC: 32 g/dL (ref 31.5–35.7)
MCV: 83 fL (ref 79–97)
Platelets: 373 10*3/uL (ref 150–450)
RBC: 5.46 x10E6/uL — ABNORMAL HIGH (ref 3.77–5.28)
RDW: 13.3 % (ref 11.7–15.4)
WBC: 12.7 10*3/uL — ABNORMAL HIGH (ref 3.4–10.8)

## 2022-10-16 LAB — LIPID PANEL
Chol/HDL Ratio: 4.2 ratio (ref 0.0–4.4)
Cholesterol, Total: 164 mg/dL (ref 100–199)
HDL: 39 mg/dL — ABNORMAL LOW (ref >39)
LDL Chol Calc (NIH): 87 mg/dL (ref 0–99)
Triglycerides: 225 mg/dL — ABNORMAL HIGH (ref 0–149)
VLDL Cholesterol Cal: 38 mg/dL (ref 5–40)

## 2022-10-16 LAB — TSH: TSH: 1.89 u[IU]/mL (ref 0.450–4.500)

## 2022-10-16 LAB — VITAMIN B12: Vitamin B-12: 617 pg/mL (ref 232–1245)

## 2022-10-16 LAB — FOLATE: Folate: >20 ng/mL

## 2022-11-12 DIAGNOSIS — Z419 Encounter for procedure for purposes other than remedying health state, unspecified: Secondary | ICD-10-CM | POA: Diagnosis not present

## 2022-11-26 ENCOUNTER — Ambulatory Visit (INDEPENDENT_AMBULATORY_CARE_PROVIDER_SITE_OTHER): Payer: Commercial Managed Care - PPO | Admitting: Family Medicine

## 2022-11-26 ENCOUNTER — Encounter: Payer: Self-pay | Admitting: Family Medicine

## 2022-11-26 DIAGNOSIS — F419 Anxiety disorder, unspecified: Secondary | ICD-10-CM | POA: Diagnosis not present

## 2022-11-26 DIAGNOSIS — M797 Fibromyalgia: Secondary | ICD-10-CM

## 2022-11-26 DIAGNOSIS — G8929 Other chronic pain: Secondary | ICD-10-CM | POA: Diagnosis not present

## 2022-11-26 DIAGNOSIS — F32A Depression, unspecified: Secondary | ICD-10-CM

## 2022-11-26 DIAGNOSIS — M545 Low back pain, unspecified: Secondary | ICD-10-CM | POA: Diagnosis not present

## 2022-11-26 MED ORDER — SERTRALINE HCL 100 MG PO TABS: 100.0000 mg | ORAL_TABLET | Freq: Every day | ORAL | 3 refills | Status: DC

## 2022-11-26 MED ORDER — PREGABALIN 75 MG PO CAPS: 75.0000 mg | ORAL_CAPSULE | Freq: Two times a day (BID) | ORAL | 1 refills | Status: DC

## 2022-11-26 NOTE — Assessment & Plan Note (Signed)
X-ray for further evaluation today. 

## 2022-11-26 NOTE — Assessment & Plan Note (Signed)
Improved but still problematic.  Increasing Zoloft to 100 mg daily.

## 2022-11-26 NOTE — Assessment & Plan Note (Signed)
Trial of Lyrica. 

## 2022-11-26 NOTE — Progress Notes (Signed)
Subjective:  Patient ID: Nancy Bishop, female    DOB: 01/07/2001  Age: 22 y.o. MRN: [id number]  CC: Chief Complaint  Patient presents with   Anxiety    And depression   low back problem    Has seen neurology, rheumatology, chiropractor for back pain  Hx of fibromyalgia and hypermobility in joints    HPI:  22 year old female presents for follow-up.  Anxiety and depression are improved but still quite troublesome PHQ-9 score of 11.  GAD-7 score of 9.  She is currently on Zoloft 50 mg daily.  Will discuss dose increase today.  Patient reports that she struggles with fibromyalgia.  She is not on any pharmacotherapy regarding this.  Has previously been seen by neurology as well as rheumatology.  Patient reports that recently she has been experiencing worsening knee and low back pain.  She has used her hemp roll-on as well as Advil without resolution.  She states that this feels different than her typical arthralgias.  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   Hypermobility syndrome 10/22/2016    Social Hx   Social History   Socioeconomic History   Marital status: Married    Spouse name: Jesslyn Viglione   Number of children: Not on file   Years of education: Not on file   Highest education level: Some college, no degree  Occupational History    Employer: [employer]  Tobacco Use   Smoking status: Never   Smokeless tobacco: Never  Vaping Use   Vaping Use: Never used  Substance and Sexual Activity   Alcohol use: Not Currently    Comment: occas   Drug use: Never   Sexual activity: Yes    Birth control/protection: None  Other Topics Concern   Not on file  Social History Narrative   ** Merged History Encounter **       Right handed  Caffeine- None  Live's at home with mom and dad     Social Determinants of Health    Financial Resource Strain: Low Risk  (04/18/2021)   Overall Financial Resource Strain (CARDIA)    Difficulty of Paying Living Expenses: Not very hard  Food Insecurity: No Food Insecurity (04/18/2021)   Hunger Vital Sign    Worried About Running Out of Food in the Last Year: Never true    [place] in the Last Year: Never true  Transportation Needs: No Transportation Needs (04/18/2021)   PRAPARE - Hydrologist (Medical): No    Lack of Transportation (Non-Medical): No  Physical Activity: Inactive (04/18/2021)   Exercise Vital Sign    Days of Exercise per Week: 0 days    Minutes of Exercise per Session: 0 min  Stress: Stress Concern Present (04/18/2021)   [place]    Feeling of Stress : To some extent  Social Connections: Socially Integrated (04/18/2021)   Social Connection and Isolation Panel [NHANES]    Frequency of Communication with Friends and Family: More than three times a week    Frequency of Social Gatherings with Friends and Family: Once a week    Attends Religious Services: More than 4 times per year    Active Member of [employer] or Organizations: Yes    Attends Archivist Meetings: 1 to 4 times per year  Marital Status: Married    Review of Systems Per HPI  Objective:  BP 113/66   Pulse 74   Temp 97.9 F (36.6 C)   Ht 4' 11.5" (1.511 m)   Wt 247 lb (112 kg)   LMP 11/01/2022 (Approximate)   SpO2 98%   BMI 49.05 kg/m      11/26/2022    9:47 AM 10/15/2022    1:45 PM 03/12/2022    5:44 AM  BP/Weight  Systolic BP 606 301 601  Diastolic BP 66 77 69  Wt. (Lbs) 247 248.8   BMI 49.05 kg/m2 49.41 kg/m2     Physical Exam Vitals and nursing note reviewed.  Constitutional:      Appearance: Normal appearance. She is obese.  HENT:     Head: Normocephalic and atraumatic.  Cardiovascular:     Rate and Rhythm: Normal rate and regular rhythm.  Pulmonary:     Effort:  Pulmonary effort is normal.     Breath sounds: Normal breath sounds. No wheezing, rhonchi or rales.  Neurological:     Mental Status: She is alert.  Psychiatric:        Mood and Affect: Mood normal.        Behavior: Behavior normal.     Lab Results  Component Value Date   WBC 12.7 (H) 10/15/2022   HGB 14.5 10/15/2022   HCT 45.3 10/15/2022   PLT 373 10/15/2022   GLUCOSE 113 (H) 10/15/2022   CHOL 164 10/15/2022   TRIG 225 (H) 10/15/2022   HDL 39 (L) 10/15/2022   LDLCALC 87 10/15/2022   ALT 21 10/15/2022   AST 18 10/15/2022   NA 139 10/15/2022   K 4.1 10/15/2022   CL 100 10/15/2022   CREATININE 0.64 10/15/2022   BUN 7 10/15/2022   CO2 25 10/15/2022   TSH 1.890 10/15/2022   HGBA1C 5.8 (H) 02/23/2021     Assessment & Plan:   Problem List Items Addressed This Visit       Other   Fibromyalgia (Chronic)    Trial of Lyrica.      Relevant Medications   sertraline (ZOLOFT) 100 MG tablet   pregabalin (LYRICA) 75 MG capsule   Chronic low back pain    X-ray for further evaluation today.      Relevant Medications   sertraline (ZOLOFT) 100 MG tablet   pregabalin (LYRICA) 75 MG capsule   Other Relevant Orders   DG Lumbar Spine Complete   Anxiety and depression    Improved but still problematic.  Increasing Zoloft to 100 mg daily.      Relevant Medications   sertraline (ZOLOFT) 100 MG tablet    Meds ordered this encounter  Medications   sertraline (ZOLOFT) 100 MG tablet    Sig: Take 1 tablet (100 mg total) by mouth daily.    Dispense:  90 tablet    Refill:  3   pregabalin (LYRICA) 75 MG capsule    Sig: Take 1 capsule (75 mg total) by mouth 2 (two) times daily.    Dispense:  60 capsule    Refill:  1    Follow-up:  Return in about 3 months (around 02/25/2023).  [place]

## 2022-11-26 NOTE — Patient Instructions (Addendum)
Xray ordered.  Zoloft increased.  Trial of lyrica.  We will call with results.  Take care  Dr. Lacinda Axon

## 2022-11-28 ENCOUNTER — Ambulatory Visit (HOSPITAL_COMMUNITY)
Admission: RE | Admit: 2022-11-28 | Discharge: 2022-11-28 | Disposition: A | Discharge status: Home / Self Care | Payer: Commercial Managed Care - PPO | Source: Ambulatory Visit | Attending: Family Medicine | Admitting: Family Medicine

## 2022-11-28 DIAGNOSIS — G8929 Other chronic pain: Secondary | ICD-10-CM | POA: Insufficient documentation

## 2022-11-28 DIAGNOSIS — M545 Low back pain, unspecified: Secondary | ICD-10-CM | POA: Diagnosis present

## 2022-12-10 NOTE — Addendum Note (Signed)
Addended by: Dairl Ponder on: 12/10/2022 01:32 PM   Modules accepted: Orders

## 2022-12-13 DIAGNOSIS — Z419 Encounter for procedure for purposes other than remedying health state, unspecified: Secondary | ICD-10-CM | POA: Diagnosis not present

## 2023-01-07 ENCOUNTER — Telehealth: Payer: Self-pay | Admitting: Family Medicine

## 2023-01-07 ENCOUNTER — Other Ambulatory Visit: Payer: Self-pay | Admitting: Family Medicine

## 2023-01-07 MED ORDER — DULOXETINE HCL 30 MG PO CPEP: 30.0000 mg | ORAL_CAPSULE | Freq: Every day | ORAL | 1 refills | Status: DC

## 2023-01-07 NOTE — Telephone Encounter (Signed)
Patient aware.

## 2023-01-07 NOTE — Telephone Encounter (Signed)
Thersa Salt G, DO     We can either increase the lyrica or try cymbalta.

## 2023-01-07 NOTE — Telephone Encounter (Signed)
Cook, Jayce G, DO     Rx sent.   

## 2023-01-07 NOTE — Telephone Encounter (Signed)
Patient states she would like to switch to Cymbalta because the Lyrica is not even touching her pain at all   [facility]

## 2023-01-07 NOTE — Telephone Encounter (Signed)
Patient states pain medication not helping ,she states pain is worst now.She taking pregabalin (LYRICA) 75 MG capsule . She is wanting to try the other medication you discuss at her last appointment. She state can't wait until April at her follow up appointment. [place]

## 2023-01-11 DIAGNOSIS — Z419 Encounter for procedure for purposes other than remedying health state, unspecified: Secondary | ICD-10-CM | POA: Diagnosis not present

## 2023-02-11 DIAGNOSIS — Z419 Encounter for procedure for purposes other than remedying health state, unspecified: Secondary | ICD-10-CM | POA: Diagnosis not present

## 2023-02-25 ENCOUNTER — Ambulatory Visit (INDEPENDENT_AMBULATORY_CARE_PROVIDER_SITE_OTHER): Payer: Commercial Managed Care - PPO | Admitting: Family Medicine

## 2023-02-25 ENCOUNTER — Encounter: Payer: Self-pay | Admitting: Family Medicine

## 2023-02-25 VITALS — BP 118/72 | HR 92 | Temp 98.1°F | Ht 59.5 in | Wt 249.0 lb

## 2023-02-25 DIAGNOSIS — F32A Depression, unspecified: Secondary | ICD-10-CM

## 2023-02-25 DIAGNOSIS — F419 Anxiety disorder, unspecified: Secondary | ICD-10-CM | POA: Diagnosis not present

## 2023-02-25 DIAGNOSIS — M797 Fibromyalgia: Secondary | ICD-10-CM | POA: Diagnosis not present

## 2023-02-25 MED ORDER — SERTRALINE HCL 100 MG PO TABS: 100.0000 mg | ORAL_TABLET | Freq: Every day | ORAL | 3 refills | Status: DC

## 2023-02-25 MED ORDER — DULOXETINE HCL 30 MG PO CPEP: 30.0000 mg | ORAL_CAPSULE | Freq: Every day | ORAL | 3 refills | Status: DC

## 2023-02-25 MED ORDER — CYCLOBENZAPRINE HCL 5 MG PO TABS: 5.0000 mg | ORAL_TABLET | Freq: Three times a day (TID) | ORAL | 1 refills | Status: DC | PRN

## 2023-02-25 NOTE — Assessment & Plan Note (Signed)
Significant improvement with Zoloft and Cymbalta.  Continue.

## 2023-02-25 NOTE — Assessment & Plan Note (Addendum)
Adding Flexeril at night.  Continue cymbalta.

## 2023-02-25 NOTE — Progress Notes (Signed)
Subjective:  Patient ID: Nancy Bishop, female    DOB: 21-Oct-2001  Age: 22 y.o. MRN: [id number]  CC: Chief Complaint  Patient presents with   Anxiety    Depression follow up- has trouble sleeping - can't go to sleep and tired all day   Fibromyalgia    Taking Nsaids otc also     HPI:  22 year old female presents for follow-up.  Patient reports that anxiety and depression and fibromyalgia have improved.  However, she has difficulty falling asleep at night.  Some of this related to her fibromyalgia and being uncomfortable.  She states that she has discomfort in her back at night.  Feels tight.  Some of this likely related to underlying anxiety and perhaps primary insomnia.  She has had significant improvement with the addition of Cymbalta.   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   Hypermobility syndrome 10/22/2016    Social Hx   Social History   Socioeconomic History   Marital status: Married    Spouse name: Danique Dorce   Number of children: Not on file   Years of education: Not on file   Highest education level: Some college, no degree  Occupational History    Employer: [employer]  Tobacco Use   Smoking status: Never   Smokeless tobacco: Never  Vaping Use   Vaping Use: Never used  Substance and Sexual Activity   Alcohol use: Not Currently    Comment: occas   Drug use: Never   Sexual activity: Yes    Birth control/protection: None  Other Topics Concern   Not on file  Social History Narrative   ** Merged History Encounter **       Right handed  Caffeine- None  Live's at home with mom and dad     Social Determinants of Health   Financial Resource Strain: Low Risk  (04/18/2021)   Overall Financial Resource Strain (CARDIA)    Difficulty of Paying Living Expenses: Not very hard  Food Insecurity: No Food Insecurity  (04/18/2021)   Hunger Vital Sign    Worried About Running Out of Food in the Last Year: Never true    Ran Out of Food in the Last Year: Never true  Transportation Needs: No Transportation Needs (04/18/2021)   PRAPARE - Administrator, Civil Service (Medical): No    Lack of Transportation (Non-Medical): No  Physical Activity: Inactive (04/18/2021)   Exercise Vital Sign    Days of Exercise per Week: 0 days    Minutes of Exercise per Session: 0 min  Stress: Stress Concern Present (04/18/2021)   [employer] of Occupational Health - Occupational Stress Questionnaire    Feeling of Stress : To some extent  Social Connections: Socially Integrated (04/18/2021)   Social Connection and Isolation Panel [NHANES]    Frequency of Communication with Friends and Family: More than three times a week    Frequency of Social Gatherings with Friends and Family: Once a week    Attends Religious Services: More than 4 times per year    Active Member of [employer] or Organizations: Yes    Attends Banker Meetings: 1 to 4 times per year    Marital Status: Married    Review of Systems Per HPI  Objective:  BP 118/72   Pulse 92   Temp 98.1 F (36.7 C)  Ht 4' 11.5" (1.511 m)   Wt 249 lb (112.9 kg)   LMP 02/12/2023   SpO2 96%   BMI 49.45 kg/m      02/25/2023    9:33 AM 11/26/2022    9:47 AM 10/15/2022    1:45 PM  BP/Weight  Systolic BP 118 113 108  Diastolic BP 72 66 77  Wt. (Lbs) 249 247 248.8  BMI 49.45 kg/m2 49.05 kg/m2 49.41 kg/m2    Physical Exam Vitals and nursing note reviewed.  Constitutional:      Appearance: Normal appearance. She is obese.  HENT:     Head: Normocephalic and atraumatic.  Eyes:     General:        Right eye: No discharge.        Left eye: No discharge.     Conjunctiva/sclera: Conjunctivae normal.  Cardiovascular:     Rate and Rhythm: Normal rate and regular rhythm.  Pulmonary:     Effort: Pulmonary effort is normal.     Breath sounds: Normal  breath sounds.  Neurological:     Mental Status: She is alert.  Psychiatric:        Mood and Affect: Mood normal.        Behavior: Behavior normal.     Lab Results  Component Value Date   WBC 12.7 (H) 10/15/2022   HGB 14.5 10/15/2022   HCT 45.3 10/15/2022   PLT 373 10/15/2022   GLUCOSE 113 (H) 10/15/2022   CHOL 164 10/15/2022   TRIG 225 (H) 10/15/2022   HDL 39 (L) 10/15/2022   LDLCALC 87 10/15/2022   ALT 21 10/15/2022   AST 18 10/15/2022   NA 139 10/15/2022   K 4.1 10/15/2022   CL 100 10/15/2022   CREATININE 0.64 10/15/2022   BUN 7 10/15/2022   CO2 25 10/15/2022   TSH 1.890 10/15/2022   HGBA1C 5.8 (H) 02/23/2021     Assessment & Plan:   Problem List Items Addressed This Visit       Other   Fibromyalgia - Primary (Chronic)    Adding Flexeril at night.  Continue cymbalta.      Relevant Medications   sertraline (ZOLOFT) 100 MG tablet   DULoxetine (CYMBALTA) 30 MG capsule   cyclobenzaprine (FLEXERIL) 5 MG tablet   Anxiety and depression    Significant improvement with Zoloft and Cymbalta.  Continue.      Relevant Medications   sertraline (ZOLOFT) 100 MG tablet   DULoxetine (CYMBALTA) 30 MG capsule    Meds ordered this encounter  Medications   sertraline (ZOLOFT) 100 MG tablet    Sig: Take 1 tablet (100 mg total) by mouth daily.    Dispense:  90 tablet    Refill:  3   DULoxetine (CYMBALTA) 30 MG capsule    Sig: Take 1 capsule (30 mg total) by mouth daily.    Dispense:  90 capsule    Refill:  3   cyclobenzaprine (FLEXERIL) 5 MG tablet    Sig: Take 1-2 tablets (5-10 mg total) by mouth 3 (three) times daily as needed for muscle spasms.    Dispense:  90 tablet    Refill:  1    Follow-up:  3 months  Charliegh Vasudevan Adriana Simas DO [facility]

## 2023-02-25 NOTE — Patient Instructions (Signed)
Medication as prescribed.  Follow up in 3 months. If this doesn't help, please let me know.  Take care  Dr. Adriana Simas

## 2023-03-04 ENCOUNTER — Encounter: Payer: Self-pay | Admitting: Family Medicine

## 2023-03-08 ENCOUNTER — Other Ambulatory Visit: Payer: Self-pay | Admitting: Family Medicine

## 2023-03-08 DIAGNOSIS — G8929 Other chronic pain: Secondary | ICD-10-CM

## 2023-03-08 DIAGNOSIS — M357 Hypermobility syndrome: Secondary | ICD-10-CM

## 2023-03-08 DIAGNOSIS — M797 Fibromyalgia: Secondary | ICD-10-CM

## 2023-03-13 DIAGNOSIS — Z419 Encounter for procedure for purposes other than remedying health state, unspecified: Secondary | ICD-10-CM | POA: Diagnosis not present

## 2023-03-19 ENCOUNTER — Encounter: Payer: Self-pay | Admitting: Physical Medicine and Rehabilitation

## 2023-04-13 DIAGNOSIS — Z419 Encounter for procedure for purposes other than remedying health state, unspecified: Secondary | ICD-10-CM | POA: Diagnosis not present

## 2023-04-15 ENCOUNTER — Encounter
Payer: Commercial Managed Care - PPO | Attending: Physical Medicine and Rehabilitation | Admitting: Physical Medicine and Rehabilitation

## 2023-04-15 VITALS — BP 126/85 | HR 103 | Ht 59.5 in | Wt 254.0 lb

## 2023-04-15 DIAGNOSIS — M797 Fibromyalgia: Secondary | ICD-10-CM | POA: Diagnosis present

## 2023-04-15 DIAGNOSIS — M357 Hypermobility syndrome: Secondary | ICD-10-CM | POA: Diagnosis present

## 2023-04-15 DIAGNOSIS — G8929 Other chronic pain: Secondary | ICD-10-CM | POA: Diagnosis present

## 2023-04-15 DIAGNOSIS — M545 Low back pain, unspecified: Secondary | ICD-10-CM | POA: Insufficient documentation

## 2023-04-15 MED ORDER — DULOXETINE HCL 30 MG PO CPEP: 30.0000 mg | ORAL_CAPSULE | Freq: Two times a day (BID) | ORAL | 3 refills | Status: DC

## 2023-04-15 NOTE — Patient Instructions (Signed)
Increase Cymbalta from 30 to 60 mg daily  Go to the pool at least twice weekly. Start with 10-15 minutes walking in shallow water and increase activity as comfortable; do not overdo it to the point that your pain is excerbated the next day.   I will look into genetic testing for hypermobility syndromes/ehlers danlos for you.   I'll see you back in clinic in 2 months.
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
## 2023-04-30 ENCOUNTER — Encounter: Payer: Self-pay | Admitting: Physical Medicine and Rehabilitation

## 2023-05-13 ENCOUNTER — Encounter: Payer: Self-pay | Admitting: Physical Medicine and Rehabilitation

## 2023-05-13 DIAGNOSIS — M545 Low back pain, unspecified: Secondary | ICD-10-CM

## 2023-05-13 DIAGNOSIS — M797 Fibromyalgia: Secondary | ICD-10-CM

## 2023-05-13 MED ORDER — DULOXETINE HCL 60 MG PO CPEP: 60.0000 mg | ORAL_CAPSULE | Freq: Every day | ORAL | 3 refills | Status: DC

## 2023-05-14 ENCOUNTER — Other Ambulatory Visit: Payer: Self-pay | Admitting: *Deleted

## 2023-05-14 MED ORDER — DULOXETINE HCL 60 MG PO CPEP: 60.0000 mg | ORAL_CAPSULE | Freq: Every day | ORAL | 3 refills | Status: DC

## 2023-05-24 ENCOUNTER — Encounter: Payer: Self-pay | Admitting: Physical Medicine and Rehabilitation

## 2023-05-24 MED ORDER — NALTREXONE HCL 50 MG PO TABS: 50.0000 mg | ORAL_TABLET | Freq: Every day | ORAL | 1 refills | Status: DC

## 2023-05-27 ENCOUNTER — Other Ambulatory Visit: Payer: Self-pay | Admitting: Family Medicine

## 2023-05-28 ENCOUNTER — Ambulatory Visit (INDEPENDENT_AMBULATORY_CARE_PROVIDER_SITE_OTHER): Payer: Commercial Managed Care - PPO | Admitting: Family Medicine

## 2023-05-28 ENCOUNTER — Encounter: Payer: Self-pay | Admitting: Family Medicine

## 2023-05-28 DIAGNOSIS — M545 Low back pain, unspecified: Secondary | ICD-10-CM | POA: Diagnosis not present

## 2023-05-28 DIAGNOSIS — G8929 Other chronic pain: Secondary | ICD-10-CM | POA: Diagnosis not present

## 2023-05-28 MED ORDER — SEMAGLUTIDE-WEIGHT MANAGEMENT 0.25 MG/0.5ML ~~LOC~~ SOAJ: 0.2500 mg | SUBCUTANEOUS | 0 refills | Status: DC

## 2023-05-28 NOTE — Patient Instructions (Signed)
I am going to reach out to pain management.  Wegovy was sent in.  Follow up in 3-6 months.

## 2023-05-29 ENCOUNTER — Encounter: Payer: Self-pay | Admitting: Family Medicine

## 2023-05-29 NOTE — Assessment & Plan Note (Signed)
Starting UUVOZD.  Hopefully insurance will cover although this is very limited at this time.

## 2023-05-29 NOTE — Progress Notes (Signed)
Subjective:  Patient ID: Nancy Bishop, female    DOB: 05/12/2001  Age: 22 y.o. MRN: [id number]  CC: Chief Complaint  Patient presents with   Pain Management    Was referred to pain management and was taken off duloxetine- started on naltraxone    Weight Management Screening    Discuss medications    HPI:  22 year old female presents for follow-up.  Patient now following with pain management.  Nancy Bishop has been placed on naltrexone.  Nancy Bishop reports continued significant pain particularly back pain.  Patient's mood is stable on Zoloft.  Patient also concerned about her weight and is interested in GLP-1 medications for weight loss.  Nancy Bishop would like to discuss this today.  Patient Active Problem List   Diagnosis Date Noted   Severe obesity (BMI >= 40) (HCC) 05/29/2023   Chronic low back pain 11/26/2022   Anxiety and depression 10/15/2022   Fibromyalgia 03/12/2018   Generalized OA 03/12/2018   Hypermobility syndrome 10/22/2016    Social Hx   Social History   Socioeconomic History   Marital status: Married    Spouse name: Nancy Bishop   Number of children: Not on file   Years of education: Not on file   Highest education level: Some college, no degree  Occupational History    Employer: [employer]  Tobacco Use   Smoking status: Never   Smokeless tobacco: Never  Vaping Use   Vaping status: Never Used  Substance and Sexual Activity   Alcohol use: Not Currently    Comment: occas   Drug use: Never   Sexual activity: Yes    Birth control/protection: None  Other Topics Concern   Not on file  Social History Narrative   ** Merged History Encounter **       Right handed  Caffeine- None  Live's at home with mom and dad     Social Determinants of Health   Financial Resource Strain: Low Risk  (04/18/2021)   Overall Financial Resource Strain (CARDIA)    Difficulty of Paying Living Expenses: Not very hard  Food Insecurity: No Food Insecurity (04/18/2021)   Hunger  Vital Sign    Worried About Running Out of Food in the Last Year: Never true    Ran Out of Food in the Last Year: Never true  Transportation Needs: No Transportation Needs (04/18/2021)   PRAPARE - Administrator, Civil Service (Medical): No    Lack of Transportation (Non-Medical): No  Physical Activity: Inactive (04/18/2021)   Exercise Vital Sign    Days of Exercise per Week: 0 days    Minutes of Exercise per Session: 0 min  Stress: Stress Concern Present (04/18/2021)   [employer] of Occupational Health - Occupational Stress Questionnaire    Feeling of Stress : To some extent  Social Connections: Unknown (03/26/2022)   Received from [facility]   Social Network    Social Network: Not on file    Review of Systems Per HPI  Objective:  BP 118/88   Pulse 92   Temp 97.9 F (36.6 C)   Ht 4' 11.5" (1.511 m)   Wt 256 lb (116.1 kg)   LMP 05/13/2023 (Exact Date)   SpO2 98%   BMI 50.84 kg/m      05/28/2023    3:12 PM 04/15/2023   10:16 AM 02/25/2023    9:33 AM  BP/Weight  Systolic BP 118 126 118  Diastolic BP 88 85 72  Wt. (Lbs) 256 254 249  BMI 50.84 kg/m2 50.44 kg/m2 49.45 kg/m2    Physical Exam Vitals and nursing note reviewed.  Constitutional:      General: Nancy Bishop is not in acute distress.    Appearance: Normal appearance. Nancy Bishop is obese.  HENT:     Head: Normocephalic and atraumatic.  Eyes:     General:        Right eye: No discharge.        Left eye: No discharge.     Conjunctiva/sclera: Conjunctivae normal.  Cardiovascular:     Rate and Rhythm: Normal rate and regular rhythm.  Pulmonary:     Effort: Pulmonary effort is normal.     Breath sounds: Normal breath sounds.  Neurological:     Mental Status: Nancy Bishop is alert.     Lab Results  Component Value Date   WBC 12.7 (H) 10/15/2022   HGB 14.5 10/15/2022   HCT 45.3 10/15/2022   PLT 373 10/15/2022   GLUCOSE 113 (H) 10/15/2022   CHOL 164 10/15/2022   TRIG 225 (H) 10/15/2022   HDL 39 (L) 10/15/2022    LDLCALC 87 10/15/2022   ALT 21 10/15/2022   AST 18 10/15/2022   NA 139 10/15/2022   K 4.1 10/15/2022   CL 100 10/15/2022   CREATININE 0.64 10/15/2022   BUN 7 10/15/2022   CO2 25 10/15/2022   TSH 1.890 10/15/2022   HGBA1C 5.8 (H) 02/23/2021     Assessment & Plan:   Problem List Items Addressed This Visit       Other   Chronic low back pain    I spoke with pain management physician.  Patient is to continue naltrexone and give it some additional time.  Nancy Bishop will follow-up with pain management.      Severe obesity (BMI >= 40) (HCC)    Starting ONGEXB.  Hopefully insurance will cover although this is very limited at this time.      Relevant Medications   Semaglutide-Weight Management 0.25 MG/0.5ML SOAJ    Meds ordered this encounter  Medications   Semaglutide-Weight Management 0.25 MG/0.5ML SOAJ    Sig: Inject 0.25 mg into the skin once a week.    Dispense:  2 mL    Refill:  0    Follow-up:  3-6 months  Analiyah Lechuga Adriana Simas DO [facility]

## 2023-05-29 NOTE — Assessment & Plan Note (Signed)
I spoke with pain management physician.  Patient is to continue naltrexone and give it some additional time.  She will follow-up with pain management.

## 2023-06-19 ENCOUNTER — Encounter
Payer: Commercial Managed Care - PPO | Attending: Physical Medicine and Rehabilitation | Admitting: Physical Medicine and Rehabilitation

## 2023-06-19 ENCOUNTER — Encounter: Payer: Self-pay | Admitting: Physical Medicine and Rehabilitation

## 2023-06-19 VITALS — BP 116/79 | HR 100 | Ht 59.5 in | Wt 259.0 lb

## 2023-06-19 DIAGNOSIS — G894 Chronic pain syndrome: Secondary | ICD-10-CM

## 2023-06-19 DIAGNOSIS — M545 Low back pain, unspecified: Secondary | ICD-10-CM | POA: Insufficient documentation

## 2023-06-19 DIAGNOSIS — G4700 Insomnia, unspecified: Secondary | ICD-10-CM

## 2023-06-19 DIAGNOSIS — G8929 Other chronic pain: Secondary | ICD-10-CM

## 2023-06-19 DIAGNOSIS — M357 Hypermobility syndrome: Secondary | ICD-10-CM | POA: Diagnosis present

## 2023-06-19 DIAGNOSIS — M797 Fibromyalgia: Secondary | ICD-10-CM | POA: Diagnosis not present

## 2023-06-19 MED ORDER — MILNACIPRAN HCL 12.5 & 25 & 50 MG PO MISC: ORAL | 0 refills | Status: DC

## 2023-06-19 NOTE — Patient Instructions (Addendum)
Obtain labs for cramping pains; I will contact you once results are available Taper Duloxetine back to 30 mg daily for 2 weeks, then stop. After off Duloxetine, start Milnacipran. Take 12.5 mg orally once on day 1; 12.5 mg twice daily on days 2 and 3; 25 mg twice daily on days 4 through 7; and 50 mg twice daily thereafter.   Contact me after 2 weeks on milnacipran to let me know how it is going  Stop naltrexone  Continue exercise in the pool; aim for 2-3 times weekly for 30 minute sessions.  Follow up in 3 months; message through mychart sooner if needed

## 2023-06-19 NOTE — Progress Notes (Signed)
Subjective:    Patient ID: Nancy Bishop, female    DOB: [birth year], 22 y.o.   MRN: [id number]  HPI  Nancy Bishop is a 22 y.o. year old female  who  has a past medical history of Fibromyalgia, Hypermobility syndrome (10/22/2016), and Numbness and tingling.   They are presenting to PM&R clinic for follow up related to reatment of chronic back, hip., and shoulder pain .  Marland Kitchen  Plan from last visit:  Fibromyalgia Hypermobility Syndrome  Increase Cymbalta from 30 to 60 mg daily   Go to the pool at least twice weekly. Start with 10-15 minutes walking in shallow water and increase activity as comfortable; do not overdo it to the point that your pain is excerbated the next day.    I will look into genetic testing for hypermobility syndromes/ehlers danlos for you.    Chronic midline low back pain without sciatica Increase Cymbalta as above Patient wishes to avoid narcotic pain medications; agree this is the most appropriate course. Cannot use NSAIDs hx reflux   PT as above; may benefit from injections in the future if no improvement.    I'll see you back in clinic in 2 months.    Other orders -     DULoxetine HCl; Take 1 capsule (30 mg total) by mouth 2 (two) times daily.  Dispense: 90 capsule; Refill: 3   Interval Hx:  - Therapies: She did 6 different sessions of PT prior to this without benefit. She has been doing ok with pool exercises, its not causing more pain but it isnt causing a huge difference. She is getting in the pool once a week. She does have a dedicated place to go.   - She takes naps 2 days out of the week, she does not spend excessive time in her bed, she goes to her bed at 7:30 pm and wakes up at 8 am.    - She has been having random cramping in her muscles after tiring out,    - Medications: Dr. Berline Chough started Naltrexone- take 12.5 1/4 tab nightly for nerve fibromyalgia. The medication has not been helpful at all and she feels like overall her sleep has been  worse since starting it.   She states the duloxetine was not refilled because when she increased the dose, it made her run out early and they would not allow an early refill. She has since been on the increased dose, but still feels when she wakes up she is "achy" and needs time to relieve.    - Other concerns: She has had a sleep study done in the past, is unsure of results, was done 6 years ago and never got results. She usually gets 5 hours of sleep a night and wakes up about 2x per night; has issues falling and staying asleep. She's never been on a dedicated sleep medicine.    Life is going great, she has a "wonderful, supporting husband" and a great family. She does not see a therapist or psychiatrist, but does not feel like she needs it. ?Before I got on medicine I had no control over what was going on in my brain; I was so jittery all the time and I park certain ways to get away quickly, I am always aware of need for an exit plan."   Pain Inventory Average Pain 8 Pain Right Now 6 My pain is constant, sharp, burning, dull, stabbing, tingling, and aching  In the last 24 hours, has pain interfered with  the following? General activity 7 Relation with others 8 Enjoyment of life 7 What TIME of day is your pain at its worst? morning  and night Sleep (in general) Poor  Pain is worse with: walking, bending, sitting, inactivity, standing, and some activites Pain improves with:  no relief Relief from Meds: 0  Family History  Problem Relation Age of Onset   Diabetes Father    Diabetes Maternal Grandmother    Hypertension Paternal Grandmother    Diabetes Paternal Grandmother    Lung cancer Paternal Grandmother    Heart failure Paternal Grandmother    Asthma Paternal Grandfather    COPD Paternal Grandfather    Dementia Paternal Grandfather    Other Father    Healthy Brother    Cervical cancer Maternal Grandmother    Alzheimer's disease Paternal Grandfather    Social History    Socioeconomic History   Marital status: Married    Spouse name: Trulee Bates   Number of children: Not on file   Years of education: Not on file   Highest education level: Some college, no degree  Occupational History    Employer: [employer]  Tobacco Use   Smoking status: Never   Smokeless tobacco: Never  Vaping Use   Vaping status: Never Used  Substance and Sexual Activity   Alcohol use: Not Currently    Comment: occas   Drug use: Never   Sexual activity: Yes    Birth control/protection: None  Other Topics Concern   Not on file  Social History Narrative   ** Merged History Encounter **       Right handed  Caffeine- None  Live's at home with mom and dad     Social Determinants of Health   Financial Resource Strain: Low Risk  (04/18/2021)   Overall Financial Resource Strain (CARDIA)    Difficulty of Paying Living Expenses: Not very hard  Food Insecurity: No Food Insecurity (04/18/2021)   Hunger Vital Sign    Worried About Running Out of Food in the Last Year: Never true    Ran Out of Food in the Last Year: Never true  Transportation Needs: No Transportation Needs (04/18/2021)   PRAPARE - Administrator, Civil Service (Medical): No    Lack of Transportation (Non-Medical): No  Physical Activity: Inactive (04/18/2021)   Exercise Vital Sign    Days of Exercise per Week: 0 days    Minutes of Exercise per Session: 0 min  Stress: Stress Concern Present (04/18/2021)   [employer] of Occupational Health - Occupational Stress Questionnaire    Feeling of Stress : To some extent  Social Connections: Unknown (03/26/2022)   Received from [facility]   Social Network    Social Network: Not on file   Past Surgical History:  Procedure Laterality Date   WISDOM TOOTH EXTRACTION  02/2018   WISDOM TOOTH EXTRACTION     Past Surgical History:  Procedure Laterality Date   WISDOM TOOTH EXTRACTION  02/2018   WISDOM TOOTH EXTRACTION     Past  Medical History:  Diagnosis Date   Fibromyalgia    Hypermobility syndrome 10/22/2016   Numbness and tingling    Ht 4' 11.5" (1.511 m)   LMP 05/13/2023 (Exact Date)   BMI 50.84 kg/m   Opioid Risk Score:   Fall Risk Score:  `1  Depression screen [facility] 2/9     05/28/2023    3:25 PM 04/15/2023   10:38 AM 02/25/2023    9:39  AM 11/26/2022    9:51 AM 10/15/2022    1:42 PM 04/18/2021   10:35 AM 06/07/2020    3:29 PM  Depression screen PHQ 2/9  Decreased Interest 1 1 0 1 1 0 0  Down, Depressed, Hopeless 1 0 0 1 1 1  0  PHQ - 2 Score 2 1 0 2 2 1  0  Altered sleeping 2 2 3 3 1 1 3   Tired, decreased energy 2 1 1 1 1 1 2   Change in appetite 2 0 0 1 1 0 0  Feeling bad or failure about yourself  0 0 0 1 2 0 0  Trouble concentrating 2 2 2 2 2  0 0  Moving slowly or fidgety/restless 0 0 0 1 1 0 0  Suicidal thoughts 0 0 0 0 0 0 0  PHQ-9 Score 10 6 6 11 10 3 5   Difficult doing work/chores Somewhat difficult  Somewhat difficult Somewhat difficult Somewhat difficult        Review of Systems  Musculoskeletal:  Positive for back pain.       B/L knee foot pain  All other systems reviewed and are negative.      Objective:   Physical Exam  PE: Constitution: Appropriate appearance for age. No apparent distress +Obese Resp: No respiratory distress. No accessory muscle usage. on RA and CTAB Cardio: Well perfused appearance. No peripheral edema. Abdomen: Nondistended. Nontender.   Psych: Appropriate mood and affect. Neuro: AAOx4. No apparent cognitive deficits    Neurologic Exam:   DTRs: Reflexes were 2+ in bilateral achilles, patella, biceps, BR and triceps. Sensory exam: revealed normal sensation in all dermatomal regions in bilateral upper extremities and bilateral lower extremities Motor exam: strength 5/5 throughout bilateral upper extremities and bilateral lower extremities Coordination: Fine motor coordination was normal.   Gait: normal MSK: + TTP throughout bl lumbar paraspinals,  PSISs     Assessment & Plan:   Nancy Bishop is a 22 y.o. year old female  who  has a past medical history of Fibromyalgia, Hypermobility syndrome (10/22/2016), and Numbness and tingling.   They are presenting to PM&R clinic as a new patient for treatment of chronic pain due ot hypermobility syndrome, fibromyalgia .    Chronic midline low back pain without sciatica Chronic pain syndrome  -     ANA -     TSH -     Hemoglobin A1c -     Magnesium -     Comprehensive metabolic panel -     CBC -     Rheumatoid factor -     T4, free -     Iron, TIBC and Ferritin Panel  Obtain labs as above for fatigue and cramping pains; I will contact you once results are available  Fibromyalgia Taper Duloxetine back to 30 mg daily for 2 weeks, then stop.  After off Duloxetine, start Milnacipran. Take 12.5 mg orally once on day 1; 12.5 mg twice daily on days 2 and 3; 25 mg twice daily on days 4 through 7; and 50 mg twice daily thereafter.   Contact me after 2 weeks on milnacipran to let me know how it is going  Stop naltrexone, as she is not benefitting  Hypermobility syndrome Continue exercise in the pool; aim for 2-3 times weekly for 30 minute sessions.  Follow up in 3 months; message through mychart sooner if needed  Insomnia, unspecified type  For sleep, I want you to pick a time to lay down every night,  ideally between 8 and 10 PM.    Starting 1 hour before you want to go to sleep, turn off all television screens, phone screens, tablets, and computers.    Keep the lights low and perform only low stimulation activities, such as reading.    Only use your bedroom for sleep and sex.    You may also take 3 to 5 mg of over-the-counter melatonin approximately 1 hour before bedtime.     Other orders -     Milnacipran HCl; Start with 12.5 mg orally once on day 1; 12.5 mg twice daily on days 2 and 3; 25 mg twice daily on days 4 through 7; and 50 mg twice daily thereafter.  Dispense: 55  each; Refill: 0

## 2023-06-24 ENCOUNTER — Encounter: Payer: Self-pay | Admitting: Family Medicine

## 2023-06-25 NOTE — Telephone Encounter (Signed)
Coral Spikes, DO     Please inform patient.

## 2023-06-26 DIAGNOSIS — G894 Chronic pain syndrome: Secondary | ICD-10-CM | POA: Insufficient documentation

## 2023-06-26 DIAGNOSIS — G4700 Insomnia, unspecified: Secondary | ICD-10-CM | POA: Insufficient documentation

## 2023-06-27 ENCOUNTER — Encounter: Payer: Self-pay | Admitting: Physical Medicine and Rehabilitation

## 2023-06-27 LAB — COMPREHENSIVE METABOLIC PANEL WITH GFR
ALT: 14 IU/L (ref 0–32)
AST: 14 IU/L (ref 0–40)
Albumin: 4.4 g/dL (ref 4.0–5.0)
Alkaline Phosphatase: 96 IU/L (ref 44–121)
BUN/Creatinine Ratio: 13 (ref 9–23)
BUN: 8 mg/dL (ref 6–20)
Bilirubin Total: 0.3 mg/dL (ref 0.0–1.2)
CO2: 24 mmol/L (ref 20–29)
Calcium: 9.5 mg/dL (ref 8.7–10.2)
Chloride: 100 mmol/L (ref 96–106)
Creatinine, Ser: 0.64 mg/dL (ref 0.57–1.00)
Globulin, Total: 2.5 g/dL (ref 1.5–4.5)
Glucose: 117 mg/dL — ABNORMAL HIGH (ref 70–99)
Potassium: 4.4 mmol/L (ref 3.5–5.2)
Sodium: 138 mmol/L (ref 134–144)
Total Protein: 6.9 g/dL (ref 6.0–8.5)
eGFR: 126 mL/min/1.73

## 2023-06-27 LAB — IRON,TIBC AND FERRITIN PANEL
Ferritin: 67 ng/mL (ref 15–150)
Iron Saturation: 11 % — ABNORMAL LOW (ref 15–55)
Iron: 40 ug/dL (ref 27–159)
Total Iron Binding Capacity: 354 ug/dL (ref 250–450)
UIBC: 314 ug/dL (ref 131–425)

## 2023-06-27 LAB — CBC
Hematocrit: 41.1 % (ref 34.0–46.6)
Hemoglobin: 13.3 g/dL (ref 11.1–15.9)
MCH: 27.1 pg (ref 26.6–33.0)
MCHC: 32.4 g/dL (ref 31.5–35.7)
MCV: 84 fL (ref 79–97)
Platelets: 356 x10E3/uL (ref 150–450)
RBC: 4.91 x10E6/uL (ref 3.77–5.28)
RDW: 13 % (ref 11.7–15.4)
WBC: 9.9 x10E3/uL (ref 3.4–10.8)

## 2023-06-27 LAB — RHEUMATOID FACTOR: Rheumatoid fact SerPl-aCnc: <10 [IU]/mL

## 2023-06-27 LAB — T4, FREE: Free T4: 1 ng/dL (ref 0.82–1.77)

## 2023-06-27 LAB — TSH: TSH: 2.19 u[IU]/mL (ref 0.450–4.500)

## 2023-06-27 LAB — ANA: Anti Nuclear Antibody (ANA): NEGATIVE

## 2023-08-28 ENCOUNTER — Ambulatory Visit (INDEPENDENT_AMBULATORY_CARE_PROVIDER_SITE_OTHER): Payer: Commercial Managed Care - PPO | Admitting: Family Medicine

## 2023-08-28 VITALS — BP 122/81 | HR 93 | Temp 98.8°F | Ht 59.5 in | Wt 263.8 lb

## 2023-08-28 DIAGNOSIS — N926 Irregular menstruation, unspecified: Secondary | ICD-10-CM | POA: Insufficient documentation

## 2023-08-28 DIAGNOSIS — M797 Fibromyalgia: Secondary | ICD-10-CM | POA: Diagnosis not present

## 2023-08-28 DIAGNOSIS — L309 Dermatitis, unspecified: Secondary | ICD-10-CM

## 2023-08-28 MED ORDER — TRIAMCINOLONE ACETONIDE 0.5 % EX OINT: 1.0000 | TOPICAL_OINTMENT | Freq: Two times a day (BID) | CUTANEOUS | 0 refills | Status: DC | PRN

## 2023-08-28 NOTE — Assessment & Plan Note (Signed)
Patient is a candidate for [employer].  Insurance has been a limiting factor.  Awaiting beta-hCG quant before trying to get this approved.

## 2023-08-28 NOTE — Patient Instructions (Signed)
Lab ordered.  Await lab result before starting the medication [facility]).  Follow up in 6 months.  Take care  Dr. Adriana Simas

## 2023-08-28 NOTE — Assessment & Plan Note (Signed)
Triamcinolone as directed.

## 2023-08-28 NOTE — Assessment & Plan Note (Addendum)
Previous prescription of Savella was not filled as she was on Zoloft at the time.  Waiting on beta-hCG quant before starting as I have concerns regarding its use during pregnancy.

## 2023-08-28 NOTE — Assessment & Plan Note (Signed)
Awaiting beta-hCG quant.

## 2023-08-28 NOTE — Progress Notes (Signed)
Subjective:  Patient ID: Nancy Bishop, female    DOB: 07/02/2001  Age: 22 y.o. MRN: [id number]  CC: Follow-up   HPI:  22 year old female presents for follow-up.  Patient reports that she is 12 days late for her menstrual cycle.  She took a home pregnancy test and it was negative.  She desires pregnancy test today.  Patient reports that she believes she has eczema.  She has intermittent rash on her hands and face.  She would like something for this.  Patient is no longer on naltrexone in regards to fibromyalgia.  She was prescribed Milnacipran but has not started.  She states that the pharmacy did not fill it as it was not recommended to be used with Zoloft.  She has tapered off Zoloft but has not yet started the medication.  Additionally, patient reports that she has had changes in her insurance to reflect her current and correct name.  She is interested in trying to see if we can get [facility] approved.    Patient Active Problem List   Diagnosis Date Noted   Missed period 08/28/2023   Eczema 08/28/2023   Chronic pain syndrome 06/26/2023   Insomnia 06/26/2023   Severe obesity (BMI >= 40) (HCC) 05/29/2023   Chronic low back pain 11/26/2022   Anxiety and depression 10/15/2022   Fibromyalgia 03/12/2018   Generalized OA 03/12/2018   Hypermobility syndrome 10/22/2016    Social Hx   Social History   Socioeconomic History   Marital status: Married    Spouse name: Candus Braud   Number of children: Not on file   Years of education: Not on file   Highest education level: Some college, no degree  Occupational History    Employer: [employer]  Tobacco Use   Smoking status: Never   Smokeless tobacco: Never  Vaping Use   Vaping status: Never Used  Substance and Sexual Activity   Alcohol use: Not Currently    Comment: occas   Drug use: Never   Sexual activity: Yes    Birth control/protection: None  Other Topics Concern   Not on file  Social History Narrative   **  Merged History Encounter **       Right handed  Caffeine- None  Live's at home with mom and dad     Social Determinants of Health   Financial Resource Strain: Low Risk  (04/18/2021)   Overall Financial Resource Strain (CARDIA)    Difficulty of Paying Living Expenses: Not very hard  Food Insecurity: No Food Insecurity (04/18/2021)   Hunger Vital Sign    Worried About Running Out of Food in the Last Year: Never true    Ran Out of Food in the Last Year: Never true  Transportation Needs: No Transportation Needs (04/18/2021)   PRAPARE - Administrator, Civil Service (Medical): No    Lack of Transportation (Non-Medical): No  Physical Activity: Inactive (04/18/2021)   Exercise Vital Sign    Days of Exercise per Week: 0 days    Minutes of Exercise per Session: 0 min  Stress: Stress Concern Present (04/18/2021)   [employer] of Occupational Health - Occupational Stress Questionnaire    Feeling of Stress : To some extent  Social Connections: Unknown (03/26/2022)   Received from [facility], [facility]   Social Network    Social Network: Not on file    Review of Systems Per HPI  Objective:  BP 122/81   Pulse 93   Temp 98.8 F (  37.1 C) (Oral)   Ht 4' 11.5" (1.511 m)   Wt 263 lb 12.8 oz (119.7 kg)   SpO2 100%   BMI 52.39 kg/m      08/28/2023    8:25 AM 06/19/2023    3:24 PM 05/28/2023    3:12 PM  BP/Weight  Systolic BP 122 116 118  Diastolic BP 81 79 88  Wt. (Lbs) 263.8 259 256  BMI 52.39 kg/m2 51.44 kg/m2 50.84 kg/m2    Physical Exam Constitutional:      Appearance: Normal appearance. She is obese.  HENT:     Head: Normocephalic and atraumatic.  Cardiovascular:     Rate and Rhythm: Normal rate and regular rhythm.  Pulmonary:     Effort: Pulmonary effort is normal.     Breath sounds: Normal breath sounds.  Neurological:     Mental Status: She is alert.  Psychiatric:     Comments: Flat affect.  Depressed mood.     Lab Results  Component Value Date    WBC 9.9 06/26/2023   HGB 13.3 06/26/2023   HCT 41.1 06/26/2023   PLT 356 06/26/2023   GLUCOSE 117 (H) 06/26/2023   CHOL 164 10/15/2022   TRIG 225 (H) 10/15/2022   HDL 39 (L) 10/15/2022   LDLCALC 87 10/15/2022   ALT 14 06/26/2023   AST 14 06/26/2023   NA 138 06/26/2023   K 4.4 06/26/2023   CL 100 06/26/2023   CREATININE 0.64 06/26/2023   BUN 8 06/26/2023   CO2 24 06/26/2023   TSH 2.190 06/26/2023   HGBA1C 6.1 (H) 06/26/2023     Assessment & Plan:   Problem List Items Addressed This Visit       Musculoskeletal and Integument   Eczema    Triamcinolone as directed.        Other   Fibromyalgia (Chronic)    Previous prescription of Savella was not filled as she was on Zoloft at the time.  Waiting beta-hCG quant before starting as I have concerns regarding its use during pregnancy.        Severe obesity (BMI >= 40) (HCC)    Patient is a candidate for [facility].  Insurance has been a limiting factor.  Awaiting beta-hCG quant before trying to get this approved.      Missed period - Primary    Awaiting beta-hCG quant.      Relevant Orders   Beta hCG quant (ref lab)    Meds ordered this encounter  Medications   triamcinolone ointment (KENALOG) 0.5 %    Sig: Apply 1 Application topically 2 (two) times daily as needed.    Dispense:  30 g    Refill:  0    Follow-up:  Return in about 6 months (around 02/26/2024) for Follow up Chronic medical issues.  Everlene Other DO [facility]

## 2023-08-29 ENCOUNTER — Other Ambulatory Visit: Payer: Self-pay | Admitting: Family Medicine

## 2023-08-29 LAB — BETA HCG QUANT (REF LAB): hCG Quant: <1 m[IU]/mL

## 2023-08-29 MED ORDER — MILNACIPRAN HCL 12.5 & 25 & 50 MG PO MISC: ORAL | 0 refills | Status: DC

## 2023-09-23 ENCOUNTER — Ambulatory Visit: Payer: Commercial Managed Care - PPO | Admitting: Family Medicine

## 2023-09-23 ENCOUNTER — Ambulatory Visit: Payer: Self-pay | Admitting: Family Medicine

## 2023-09-23 VITALS — BP 119/87 | HR 96 | Temp 98.5°F | Ht 59.5 in | Wt 262.2 lb

## 2023-09-23 DIAGNOSIS — M797 Fibromyalgia: Secondary | ICD-10-CM | POA: Diagnosis not present

## 2023-09-23 DIAGNOSIS — F32A Depression, unspecified: Secondary | ICD-10-CM | POA: Diagnosis not present

## 2023-09-23 DIAGNOSIS — J0100 Acute maxillary sinusitis, unspecified: Secondary | ICD-10-CM | POA: Insufficient documentation

## 2023-09-23 DIAGNOSIS — F419 Anxiety disorder, unspecified: Secondary | ICD-10-CM

## 2023-09-23 MED ORDER — DOXYCYCLINE HYCLATE 100 MG PO TABS: 100.0000 mg | ORAL_TABLET | Freq: Two times a day (BID) | ORAL | 0 refills | Status: DC

## 2023-09-23 MED ORDER — VENLAFAXINE HCL ER 37.5 MG PO CP24: 37.5000 mg | ORAL_CAPSULE | Freq: Every day | ORAL | 1 refills | Status: DC

## 2023-09-23 NOTE — Telephone Encounter (Signed)
Copied from CRM [id number]. Topic: Clinical - Red Word Triage >> Sep 23, 2023  8:04 AM Dennison Nancy wrote: Red Word that prompted transfer to Nurse Triage: swollen gland,bacteria sinus infection day 6 . Callback # [phone] Reason for Disposition  [1] Using nasal washes and pain medicine > 24 hours AND [2] sinus pain (around cheekbone or eye) persists  Answer Assessment - Initial Assessment Questions 1. LOCATION: "Where does it hurt?"     Headache behind eyes and Sore throat with swelling on right side of neck 2. ONSET: "When did the sinus pain start?"  (e.g., hours, days)      6 days ago; unrelieved with OTC Mucinex 3. SEVERITY: "How bad is the pain?"   (Scale 1-10; mild, moderate or severe)   - MILD (1-3): doesn't interfere with normal activities    - MODERATE (4-7): interferes with normal activities (e.g., work or school) or awakens from sleep   - SEVERE (8-10): excruciating pain and patient unable to do any normal activities        7 4. RECURRENT SYMPTOM: "Have you ever had sinus problems before?" If Yes, ask: "When was the last time?" and "What happened that time?"      Yes, but this is the worse. Usually able to clear it up with OTC meds 5. NASAL CONGESTION: "Is the nose blocked?" If Yes, ask: "Can you open it or must you breathe through your mouth?"     Yes, doing nasal flushes 6. NASAL DISCHARGE: "Do you have discharge from your nose?" If so ask, "What color?"     Yellow and green 7. FEVER: "Do you have a fever?" If Yes, ask: "What is it, how was it measured, and when did it start?"      No 8. OTHER SYMPTOMS: "Do you have any other symptoms?" (e.g., sore throat, cough, earache, difficulty breathing)     Sore throat, cough, earache 9. PREGNANCY: "Is there any chance you are pregnant?" "When was your last menstrual period?"     "Very slim"  Protocols used: Sinus Pain or Congestion-A-AH

## 2023-09-23 NOTE — Assessment & Plan Note (Signed)
Treating with doxycycline. 

## 2023-09-23 NOTE — Patient Instructions (Signed)
Medications as prescribed. ° °Take care ° °Dr. Allon Costlow  °

## 2023-09-23 NOTE — Assessment & Plan Note (Signed)
Trial of Effexor.

## 2023-09-23 NOTE — Assessment & Plan Note (Signed)
Starting Effexor.

## 2023-09-23 NOTE — Progress Notes (Signed)
Subjective:  Patient ID: Nancy Bishop, female    DOB: 11/11/2001  Age: 22 y.o. MRN: [id number]  CC: Respiratory symptoms   HPI:  22 year old female presents for evaluation of the above.  Patient reports that she is on day 6 of symptoms.  Reports sinus pressure, headache, mucus production, body aches.  No fever.  No relief with Tylenol, Mucinex, NyQuil.  She is also taken some leftover Augmentin without resolution.  Patient also reports she feels like she has cervical lymphadenopathy.  No known exacerbating factors.  No other associated symptoms.  Patient also reports that she is longer taking Savella.  She states that she has difficulty remembering to take it twice a day and would like something else prescribed.  She has fibromyalgia and underlying anxiety and depression.  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   Hypermobility syndrome 10/22/2016    Social Hx   Social History   Socioeconomic History   Marital status: Married    Spouse name: Kioni Franklyn   Number of children: Not on file   Years of education: Not on file   Highest education level: Some college, no degree  Occupational History    Employer: [employer]  Tobacco Use   Smoking status: Never   Smokeless tobacco: Never  Vaping Use   Vaping status: Never Used  Substance and Sexual Activity   Alcohol use: Not Currently    Comment: occas   Drug use: Never   Sexual activity: Yes    Birth control/protection: None  Other Topics Concern   Not on file  Social History Narrative   ** Merged History Encounter **       Right handed  Caffeine- None  Live's at home with mom and dad     Social Determinants  of Health   Financial Resource Strain: Low Risk  (04/18/2021)   Overall Financial Resource Strain (CARDIA)    Difficulty of Paying Living Expenses: Not very hard  Food Insecurity: No Food Insecurity (04/18/2021)   Hunger Vital Sign    Worried About Running Out of Food in the Last Year: Never true    Ran Out of Food in the Last Year: Never true  Transportation Needs: No Transportation Needs (04/18/2021)   PRAPARE - Administrator, Civil Service (Medical): No    Lack of Transportation (Non-Medical): No  Physical Activity: Inactive (04/18/2021)   Exercise Vital Sign    Days of Exercise per Week: 0 days    Minutes of Exercise per Session: 0 min  Stress: Stress Concern Present (04/18/2021)   [employer] of Occupational Health - Occupational Stress Questionnaire    Feeling of Stress : To some extent  Social Connections: Unknown (03/26/2022)   Received from [facility], [facility]   Social Network    Social Network: Not on file    Review of Systems Per HPI  Objective:  BP 119/87   Pulse 96   Temp 98.5 F (36.9 C)   Ht 4' 11.5" (1.511 m)   Wt 262 lb 3.2 oz (118.9 kg)   SpO2 98%   BMI 52.07 kg/m      09/23/2023   11:08 AM 08/28/2023  8:25 AM 06/19/2023    3:24 PM  BP/Weight  Systolic BP 119 122 116  Diastolic BP 87 81 79  Wt. (Lbs) 262.2 263.8 259  BMI 52.07 kg/m2 52.39 kg/m2 51.44 kg/m2    Physical Exam Vitals and nursing note reviewed.  Constitutional:      General: She is not in acute distress.    Appearance: Normal appearance. She is obese.  HENT:     Head: Normocephalic and atraumatic.     Right Ear: Tympanic membrane normal.     Left Ear: Tympanic membrane normal.     Nose: Congestion present.     Mouth/Throat:     Pharynx: Oropharynx is clear.  Cardiovascular:     Rate and Rhythm: Normal rate and regular rhythm.  Pulmonary:     Effort: Pulmonary effort is normal.     Breath sounds: Normal breath sounds. No wheezing, rhonchi or rales.   Neurological:     Mental Status: She is alert.     Lab Results  Component Value Date   WBC 9.9 06/26/2023   HGB 13.3 06/26/2023   HCT 41.1 06/26/2023   PLT 356 06/26/2023   GLUCOSE 117 (H) 06/26/2023   CHOL 164 10/15/2022   TRIG 225 (H) 10/15/2022   HDL 39 (L) 10/15/2022   LDLCALC 87 10/15/2022   ALT 14 06/26/2023   AST 14 06/26/2023   NA 138 06/26/2023   K 4.4 06/26/2023   CL 100 06/26/2023   CREATININE 0.64 06/26/2023   BUN 8 06/26/2023   CO2 24 06/26/2023   TSH 2.190 06/26/2023   HGBA1C 6.1 (H) 06/26/2023     Assessment & Plan:   Problem List Items Addressed This Visit       Respiratory   Acute maxillary sinusitis - Primary    Treating with doxycycline.      Relevant Medications   doxycycline (VIBRA-TABS) 100 MG tablet     Other   Fibromyalgia (Chronic)    Trial of Effexor.      Relevant Medications   venlafaxine XR (EFFEXOR XR) 37.5 MG 24 hr capsule   Anxiety and depression    Starting Effexor.      Relevant Medications   venlafaxine XR (EFFEXOR XR) 37.5 MG 24 hr capsule    Meds ordered this encounter  Medications   venlafaxine XR (EFFEXOR XR) 37.5 MG 24 hr capsule    Sig: Take 1 capsule (37.5 mg total) by mouth daily.    Dispense:  90 capsule    Refill:  1   doxycycline (VIBRA-TABS) 100 MG tablet    Sig: Take 1 tablet (100 mg total) by mouth 2 (two) times daily. Take with food and water.    Dispense:  14 tablet    Refill:  0   Bellagrace Sylvan DO [facility]

## 2023-09-25 ENCOUNTER — Encounter: Payer: Commercial Managed Care - PPO | Admitting: Physical Medicine and Rehabilitation

## 2023-10-02 ENCOUNTER — Encounter: Payer: Commercial Managed Care - PPO | Admitting: Physical Medicine and Rehabilitation

## 2023-10-16 ENCOUNTER — Encounter: Payer: Self-pay | Admitting: Family Medicine

## 2023-10-21 ENCOUNTER — Encounter
Payer: Commercial Managed Care - PPO | Attending: Physical Medicine and Rehabilitation | Admitting: Physical Medicine and Rehabilitation

## 2023-10-21 VITALS — BP 117/87 | HR 106 | Ht 59.5 in | Wt 262.0 lb

## 2023-10-21 DIAGNOSIS — F32A Depression, unspecified: Secondary | ICD-10-CM

## 2023-10-21 DIAGNOSIS — G4700 Insomnia, unspecified: Secondary | ICD-10-CM

## 2023-10-21 DIAGNOSIS — F419 Anxiety disorder, unspecified: Secondary | ICD-10-CM

## 2023-10-21 DIAGNOSIS — M797 Fibromyalgia: Secondary | ICD-10-CM | POA: Diagnosis not present

## 2023-10-21 DIAGNOSIS — G894 Chronic pain syndrome: Secondary | ICD-10-CM

## 2023-10-21 DIAGNOSIS — G8929 Other chronic pain: Secondary | ICD-10-CM | POA: Diagnosis present

## 2023-10-21 DIAGNOSIS — M545 Low back pain, unspecified: Secondary | ICD-10-CM

## 2023-10-21 MED ORDER — METHOCARBAMOL 750 MG PO TABS: 750.0000 mg | ORAL_TABLET | Freq: Three times a day (TID) | ORAL | 5 refills | Status: DC | PRN

## 2023-10-21 MED ORDER — LIDOCAINE 5 % EX PTCH: 1.0000 | MEDICATED_PATCH | Freq: Every day | CUTANEOUS | 2 refills | Status: DC | PRN

## 2023-10-21 NOTE — Progress Notes (Signed)
Subjective:    Patient ID: Nancy Bishop, female    DOB: 07-11-2001, 22 y.o.   MRN: [id number]  HPI  Nancy Bishop is a 22 y.o. year old female  who  has a past medical history of Fibromyalgia, Hypermobility syndrome (10/22/2016), and Numbness and tingling.  They are presenting to PM&R clinic as a new patient for treatment of chronic pain due ot hypermobility syndrome, fibromyalgia .    Plan from last visit:  Chronic midline low back pain without sciatica Chronic pain syndrome   -     ANA -     TSH -     Hemoglobin A1c -     Magnesium -     Comprehensive metabolic panel -     CBC -     Rheumatoid factor -     T4, free -     Iron, TIBC and Ferritin Panel   Obtain labs as above for fatigue and cramping pains; I will contact you once results are available   Fibromyalgia Taper Duloxetine back to 30 mg daily for 2 weeks, then stop.   After off Duloxetine, start Milnacipran. Take 12.5 mg orally once on day 1; 12.5 mg twice daily on days 2 and 3; 25 mg twice daily on days 4 through 7; and 50 mg twice daily thereafter.    Contact me after 2 weeks on milnacipran to let me know how it is going   Stop naltrexone, as she is not benefitting   Hypermobility syndrome Continue exercise in the pool; aim for 2-3 times weekly for 30 minute sessions.   Follow up in 3 months; message through mychart sooner if needed   Insomnia, unspecified type   For sleep, I want you to pick a time to lay down every night, ideally between 8 and 10 PM.     Starting 1 hour before you want to go to sleep, turn off all television screens, phone screens, tablets, and computers.     Keep the lights low and perform only low stimulation activities, such as reading.     Only use your bedroom for sleep and sex.     You may also take 3 to 5 mg of over-the-counter melatonin approximately 1 hour before bedtime.   "So I've come off duloxitine completely and went to go pick up that new medication you wanted  me to try that starts in a pack, when I went to pay for it the pharmacist asked me if I was still on Sertraline, said he'd wait to pick up the pack until after I talked to you, led on that those two medications don't mix well. Please advise on what to do next, as I'm still suffering most days; especially on no medications for pain ."  From prior visit:  Dr. Berline Chough started Naltrexone- take 12.5 1/4 tab nightly for nerve fibromyalgia. The medication has not been helpful at all and she feels like overall her sleep has been worse since starting it.   Interval Hx:  - Therapies: For physical activity is getting out of the house daily; was in [facility] this last weekend. Has to walk to check on her chickens twice daily. No massages or home exercises. Hasn't been swimming lately because of the cold.    - Follow ups: Saw Dr. Adriana Simas 10/16 - was weaned off sertraline. Started minalcopran pack but did it incorrectly, did not have any effect from the medication.    - Falls: none   - DME: none   -  Medications: started Effexor 11/11 with Dr. Adriana Simas, "I still dont feel like it's in my system enough to be working yet." Later admits that she stopped it after 3 weeks, no side effects "but if I'm honest I'm done trying mental drugs because I dont know what my personality or attitude is off of medicine right now."    - Other concerns: "My SI joints that got injured in my accident in 2021 has been flaring up a lot more; it's sudden, it can hurt for one minute and then it just takes you off your feet." Uses roll on hemp  for temporary relief.   Pain Inventory Average Pain 7 Pain Right Now 6 My pain is sharp, burning, dull, stabbing, tingling, and aching  In the last 24 hours, has pain interfered with the following? General activity 7 Relation with others 5 Enjoyment of life 5 What TIME of day is your pain at its worst? morning  and night Sleep (in general) Poor  Pain is worse with: walking and standing Pain  improves with:  . Relief from Meds: 2  Family History  Problem Relation Age of Onset   Diabetes Father    Diabetes Maternal Grandmother    Hypertension Paternal Grandmother    Diabetes Paternal Grandmother    Lung cancer Paternal Grandmother    Heart failure Paternal Grandmother    Asthma Paternal Grandfather    COPD Paternal Grandfather    Dementia Paternal Grandfather    Other Father    Healthy Brother    Cervical cancer Maternal Grandmother    Alzheimer's disease Paternal Grandfather    Social History   Socioeconomic History   Marital status: Married    Spouse name: Nancy Bishop   Number of children: Not on file   Years of education: Not on file   Highest education level: Some college, no degree  Occupational History    Employer: [employer]  Tobacco Use   Smoking status: Never   Smokeless tobacco: Never  Vaping Use   Vaping status: Never Used  Substance and Sexual Activity   Alcohol use: Not Currently    Comment: occas   Drug use: Never   Sexual activity: Yes    Birth control/protection: None  Other Topics Concern   Not on file  Social History Narrative   ** Merged History Encounter **       Right handed  Caffeine- None  Live's at home with mom and dad     Social Determinants of Health   Financial Resource Strain: Low Risk  (04/18/2021)   Overall Financial Resource Strain (CARDIA)    Difficulty of Paying Living Expenses: Not very hard  Food Insecurity: No Food Insecurity (04/18/2021)   Hunger Vital Sign    Worried About Running Out of Food in the Last Year: Never true    Ran Out of Food in the Last Year: Never true  Transportation Needs: No Transportation Needs (04/18/2021)   PRAPARE - Administrator, Civil Service (Medical): No    Lack of Transportation (Non-Medical): No  Physical Activity: Inactive (04/18/2021)   Exercise Vital Sign    Days of Exercise per Week: 0 days    Minutes of Exercise per Session: 0 min  Stress: Stress Concern  Present (04/18/2021)   [employer] of Occupational Health - Occupational Stress Questionnaire    Feeling of Stress : To some extent  Social Connections: Unknown (03/26/2022)   Received from [facility], [facility]   Social Network  Social Network: Not on file   Past Surgical History:  Procedure Laterality Date   WISDOM TOOTH EXTRACTION  02/2018   WISDOM TOOTH EXTRACTION     Past Surgical History:  Procedure Laterality Date   WISDOM TOOTH EXTRACTION  02/2018   WISDOM TOOTH EXTRACTION     Past Medical History:  Diagnosis Date   Fibromyalgia    Hypermobility syndrome 10/22/2016   Numbness and tingling    BP 117/87   Pulse (!) 106   Ht 4' 11.5" (1.511 m)   Wt 262 lb (118.8 kg)   SpO2 98%   BMI 52.03 kg/m   Opioid Risk Score:   Fall Risk Score:  `1  Depression screen PHQ 2/9     06/19/2023    3:30 PM 05/28/2023    3:25 PM 04/15/2023   10:38 AM 02/25/2023    9:39 AM 11/26/2022    9:51 AM 10/15/2022    1:42 PM 04/18/2021   10:35 AM  Depression screen PHQ 2/9  Decreased Interest 0 1 1 0 1 1 0  Down, Depressed, Hopeless 0 1 0 0 1 1 1   PHQ - 2 Score 0 2 1 0 2 2 1   Altered sleeping  2 2 3 3 1 1   Tired, decreased energy  2 1 1 1 1 1   Change in appetite  2 0 0 1 1 0  Feeling bad or failure about yourself   0 0 0 1 2 0  Trouble concentrating  2 2 2 2 2  0  Moving slowly or fidgety/restless  0 0 0 1 1 0  Suicidal thoughts  0 0 0 0 0 0  PHQ-9 Score  10 6 6 11 10 3   Difficult doing work/chores  Somewhat difficult  Somewhat difficult Somewhat difficult Somewhat difficult     Review of Systems  Musculoskeletal:  Positive for back pain.       Bilateral knee pain  All other systems reviewed and are negative.     Objective:   Physical Exam    PE: Constitution: Appropriate appearance for age. No apparent distress +Obese Resp: No respiratory distress. No accessory muscle usage. on RA and CTAB Cardio: Well perfused appearance. No peripheral edema. Abdomen:  Nondistended. Nontender.   Psych: Appropriate mood and affect. Neuro: AAOx4. No apparent cognitive deficits    Neurologic Exam:   DTRs: Reflexes were 2+ in bilateral achilles, patella, biceps, BR and triceps. Sensory exam: revealed normal sensation in all dermatomal regions in bilateral upper extremities and bilateral lower extremities.  Motor exam: strength 5/5 throughout bilateral upper extremities and bilateral lower extremities Gait: normal MSK: + TTP throughout bl lumbar paraspinals, PSISs, SI joints. No palpable triggerpoints.        Assessment & Plan:  Nancy Bishop is a 22 y.o. year old female  who  has a past medical history of Fibromyalgia, Hypermobility syndrome (10/22/2016), and Numbness and tingling.   They are presenting to PM&R clinic as a new patient for treatment of chronic pain due ot hypermobility syndrome, fibromyalgia .    Chronic pain syndrome Chronic midline low back pain without sciatica We talked about starting an NSAID like celecoxib, however given you may try to get pregnant in the near future we discussed risks and benefits of this and decided to continue with Robaxin and not prescribe Celebrex.  You can still use ibuprofen over-the-counter as needed intermittently for pain, but I would use this sparingly if you are trying to get pregnant.  Tylenol is a  safer medication for this.  Follow-up with me in 3 months or sooner if needed.  Fibromyalgia Never started Minalcipran d/t concerns for antidepressant interaction Failed low dose natrexone   Start Robaxin 750 mg 3 times daily as needed for muscle soreness and spasms.  Also start lidocaine patches over-the-counter 2 patches as needed for joint pain, primarily in your bilateral low back but can also use on your neck or other areas of arthritic pain in the body.  If insurance does not cover this patch, you can use Aspercreme patches over-the-counter as well.  Insomnia Anxiety and Depression Failed duloxetine,  recently stopped Effexor after 3 weeks  We discussed referral to psychiatry given multiple failed antidepressants, however at this point your goals are more oriented towards pain treatment, so I will wait on this referral and we can readdress at future visits    Other orders -     Methocarbamol; Take 1 tablet (750 mg total) by mouth every 8 (eight) hours as needed for muscle spasms (muscle soreness/pain). (Patient not taking: Reported on 10/25/2023)  Dispense: 90 tablet; Refill: 5

## 2023-10-21 NOTE — Patient Instructions (Signed)
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  We discussed referral to psychiatry given multiple failed antidepressants, however at this point your goals are more oriented towards pain treatment, so I will wait on this referral and we can readdress at future visits if you think.  Start Robaxin 750 mg 3 times daily as needed for muscle soreness and spasms.  Also start lidocaine patches over-the-counter 2 patches as needed for joint pain, primarily in your bilateral low back but can also use on your neck or other areas of arthritic pain in the body.  If insurance does not cover this patch, you can use Aspercreme patches over-the-counter as well.  We talked about starting an NSAID like celecoxib, however given you may try to get pregnant in the near future we discussed risks and benefits of this and decided to continue with Robaxin and not prescribe Celebrex.  You can still use ibuprofen over-the-counter as needed intermittently for pain, but I would use this sparingly if you are trying to get pregnant.  Tylenol is a safer medication for this.  Follow-up with me in 3 months or sooner if needed.

## 2023-10-22 ENCOUNTER — Telehealth: Payer: Self-pay

## 2023-10-22 NOTE — Telephone Encounter (Signed)
PA for Lidocaine patches submitted. 

## 2023-10-23 NOTE — Telephone Encounter (Signed)
Lidocaine patches denied

## 2023-10-23 NOTE — Telephone Encounter (Signed)
Thanks; patient already informed to try OTC options if it was denied. Thanks!

## 2023-10-24 ENCOUNTER — Encounter: Payer: Self-pay | Admitting: Family Medicine

## 2023-10-25 ENCOUNTER — Ambulatory Visit (INDEPENDENT_AMBULATORY_CARE_PROVIDER_SITE_OTHER): Payer: Commercial Managed Care - PPO | Admitting: Physician Assistant

## 2023-10-25 ENCOUNTER — Encounter: Payer: Self-pay | Admitting: Physician Assistant

## 2023-10-25 VITALS — BP 125/83 | HR 98 | Ht 59.5 in | Wt 259.0 lb

## 2023-10-25 DIAGNOSIS — J358 Other chronic diseases of tonsils and adenoids: Secondary | ICD-10-CM

## 2023-10-25 DIAGNOSIS — R0982 Postnasal drip: Secondary | ICD-10-CM

## 2023-10-25 DIAGNOSIS — R0683 Snoring: Secondary | ICD-10-CM

## 2023-10-25 NOTE — Telephone Encounter (Signed)
Patient scheduled appt with Toni Amend PA today at 10:20 am

## 2023-10-25 NOTE — Progress Notes (Signed)
   Acute Office Visit  Subjective:     Patient ID: Nancy Bishop, female    DOB: February 14, 2001, 22 y.o.   MRN: [id number]   Sore Throat  Pertinent negatives include no congestion, coughing or shortness of breath.   Patient is in today for concerns of enlarged tonsils and snoring.  She states she was treated for a sinus infection over a month ago with full resolution.  For about 3 weeks she is noted new snoring.  She states she has tried no steps, mouth tape, position changes.  In addition, over the last week or so she reports she feels like there is something in the back of her throat.  She states she does not feel sick, denies sore throat, congestion, cough, fever.  She admits to possible tonsil stone today.  Review of Systems  Constitutional:  Negative for fever and malaise/fatigue.  HENT:  Negative for congestion, sinus pain and sore throat.   Respiratory:  Negative for cough and shortness of breath.   Cardiovascular: Negative.       Objective:     BP 125/83   Pulse 98   Ht 4' 11.5" (1.511 m)   Wt 259 lb (117.5 kg)   SpO2 93%   BMI 51.44 kg/m   Physical Exam Constitutional:      Appearance: Normal appearance. She is obese.  HENT:     Mouth/Throat:     Mouth: Mucous membranes are moist.     Pharynx: Oropharynx is clear. No oropharyngeal exudate or posterior oropharyngeal erythema.  Eyes:     Extraocular Movements: Extraocular movements intact.     Conjunctiva/sclera: Conjunctivae normal.  Cardiovascular:     Rate and Rhythm: Normal rate and regular rhythm.  Pulmonary:     Effort: Pulmonary effort is normal.     Breath sounds: Normal breath sounds.  Musculoskeletal:     Cervical back: Normal range of motion. No tenderness.  Lymphadenopathy:     Cervical: No cervical adenopathy.  Skin:    General: Skin is warm and dry.  Neurological:     General: No focal deficit present.     Mental Status: She is alert and oriented to person, place, and time.  Psychiatric:         Mood and Affect: Mood normal.        Behavior: Behavior normal.     No results found for any visits on 10/25/23.      Assessment & Plan:  Post-nasal drip  Tonsil stone  Snoring   Patient appears well today in exam, no signs of acute illness.  Exam without any positive findings.  Patient encouraged to use salt water gargles for throat symptoms.  She can use Flonase to help with postnasal drip.  Patient encouraged to follow-up if symptoms worsen.  Will consider ENT consult if symptoms of snoring persist.  Return if symptoms worsen or fail to improve.  Toni Amend Destinie Thornsberry, PA-C

## 2023-10-28 NOTE — Telephone Encounter (Signed)
Nancy Sams, DO    10/28/23  9:47 AM Needs to be seen.

## 2024-01-20 ENCOUNTER — Encounter: Payer: Commercial Managed Care - PPO | Admitting: Physical Medicine and Rehabilitation

## 2024-01-29 ENCOUNTER — Ambulatory Visit (INDEPENDENT_AMBULATORY_CARE_PROVIDER_SITE_OTHER): Admitting: Family Medicine

## 2024-01-29 VITALS — BP 118/88 | HR 105 | Temp 97.9°F | Ht 59.5 in | Wt 259.0 lb

## 2024-01-29 DIAGNOSIS — R221 Localized swelling, mass and lump, neck: Secondary | ICD-10-CM | POA: Insufficient documentation

## 2024-01-29 DIAGNOSIS — Z319 Encounter for procreative management, unspecified: Secondary | ICD-10-CM | POA: Insufficient documentation

## 2024-01-29 NOTE — Assessment & Plan Note (Signed)
 Obtaining thyroid studies and ultrasound for further evaluation.

## 2024-01-29 NOTE — Progress Notes (Signed)
 Subjective:  Patient ID: Nancy Bishop, female    DOB: [birth year]  Age: 23 y.o. MRN: [id number]  CC:   Chief Complaint  Patient presents with   neck swelling right side    On and off for a couple of months , this time 1.5 weeks , no hx of family tyroid issues, no trouble swallowing or pain present    HPI:  23 year old female presents for evaluation of the above.  Patient reports that her mother has noted swelling to the right side of her neck.  Patient states that she is unsure about all this has been going on.  She does not really pay much attention to it.  No trouble swallowing.  No sore throat.  No pain.  Patient's mother encouraged her to come in to be evaluated.  Concern for thyroid mass or nodule.  Patient is also trying to conceive.  She is inquiring to whether I will prescribe Clomid to help ovulation.  Patient Active Problem List   Diagnosis Date Noted   Localized swelling, mass and lump, neck 01/29/2024   Patient desires pregnancy 01/29/2024   Eczema 08/28/2023   Chronic pain syndrome 06/26/2023   Insomnia 06/26/2023   Severe obesity (BMI >= 40) (HCC) 05/29/2023   Chronic low back pain 11/26/2022   Anxiety and depression 10/15/2022   Fibromyalgia 03/12/2018   Hypermobility syndrome 10/22/2016    Social Hx   Social History   Socioeconomic History   Marital status: Married    Spouse name: Emanuella Nickle   Number of children: Not on file   Years of education: Not on file   Highest education level: Associate degree: academic program  Occupational History    Employer: [employer]  Tobacco Use   Smoking status: Never   Smokeless tobacco: Never  Vaping Use   Vaping status: Never Used  Substance and Sexual Activity   Alcohol use: Not Currently    Comment: occas   Drug use: Never   Sexual activity: Yes    Birth control/protection: None  Other Topics Concern   Not on file  Social History Narrative   ** Merged History Encounter **       Right handed   Caffeine- None  Live's at home with mom and dad     Social Drivers of Health   Financial Resource Strain: Low Risk  (01/28/2024)   Overall Financial Resource Strain (CARDIA)    Difficulty of Paying Living Expenses: Not very hard  Food Insecurity: No Food Insecurity (01/28/2024)   Hunger Vital Sign    Worried About Running Out of Food in the Last Year: Never true    Ran Out of Food in the Last Year: Never true  Transportation Needs: No Transportation Needs (01/28/2024)   PRAPARE - Administrator, Civil Service (Medical): No    Lack of Transportation (Non-Medical): No  Physical Activity: Unknown (01/28/2024)   Exercise Vital Sign    Days of Exercise per Week: 0 days    Minutes of Exercise per Session: Not on file  Stress: No Stress Concern Present (01/28/2024)   [employer] of Occupational Health - Occupational Stress Questionnaire    Feeling of Stress : Only a little  Social Connections: Socially Integrated (01/28/2024)   Social Connection and Isolation Panel [NHANES]    Frequency of Communication with Friends and Family: More than three times a week    Frequency of Social Gatherings with Friends and Family: Twice a week    Attends Religious  Services: More than 4 times per year    Active Member of Clubs or Organizations: Yes    Attends Banker Meetings: More than 4 times per year    Marital Status: Married    Review of Systems Per HPI  Objective:  BP 118/88   Pulse (!) 105   Temp 97.9 F (36.6 C)   Ht 4' 11.5" (1.511 m)   Wt 259 lb (117.5 kg)   LMP 01/13/2024 (Exact Date)   SpO2 96%   BMI 51.44 kg/m      01/29/2024    1:10 PM 10/25/2023   10:17 AM 10/21/2023    1:03 PM  BP/Weight  Systolic BP 118 125 117  Diastolic BP 88 83 87  Wt. (Lbs) 259 259 262  BMI 51.44 kg/m2 51.44 kg/m2 52.03 kg/m2    Physical Exam Vitals and nursing note reviewed.  Constitutional:      General: She is not in acute distress.    Appearance: She is obese.   HENT:     Head: Normocephalic and atraumatic.  Neck:     Comments: Fullness noted to the right side of the neck. Cardiovascular:     Rate and Rhythm: Normal rate and regular rhythm.  Pulmonary:     Effort: Pulmonary effort is normal.     Breath sounds: Normal breath sounds.  Neurological:     Mental Status: She is alert.     Lab Results  Component Value Date   WBC 9.9 06/26/2023   HGB 13.3 06/26/2023   HCT 41.1 06/26/2023   PLT 356 06/26/2023   GLUCOSE 117 (H) 06/26/2023   CHOL 164 10/15/2022   TRIG 225 (H) 10/15/2022   HDL 39 (L) 10/15/2022   LDLCALC 87 10/15/2022   ALT 14 06/26/2023   AST 14 06/26/2023   NA 138 06/26/2023   K 4.4 06/26/2023   CL 100 06/26/2023   CREATININE 0.64 06/26/2023   BUN 8 06/26/2023   CO2 24 06/26/2023   TSH 2.190 06/26/2023   HGBA1C 6.1 (H) 06/26/2023     Assessment & Plan:  Localized swelling, mass and lump, neck Assessment & Plan: Obtaining thyroid studies and ultrasound for further evaluation.  Orders: -     US THYROID -     TSH + free T4 -     T3, free  Patient desires pregnancy Assessment & Plan: Advised patient to see OB/GYN.  I do not prescribe/manage Clomid.     Follow-up:  Pending work up  [employer] DO [facility]

## 2024-01-29 NOTE — Patient Instructions (Signed)
 Labs today.  [place] ordered. They will call and schedule.  See OB regarding Clomid.

## 2024-01-29 NOTE — Assessment & Plan Note (Signed)
 Advised patient to see OB/GYN.  I do not prescribe/manage Clomid.

## 2024-01-30 ENCOUNTER — Encounter: Payer: Self-pay | Admitting: Family Medicine

## 2024-01-30 ENCOUNTER — Encounter: Payer: Self-pay | Admitting: Women's Health

## 2024-01-30 ENCOUNTER — Ambulatory Visit (INDEPENDENT_AMBULATORY_CARE_PROVIDER_SITE_OTHER): Admitting: Women's Health

## 2024-01-30 VITALS — BP 119/86 | HR 101 | Ht 60.0 in | Wt 260.4 lb

## 2024-01-30 DIAGNOSIS — R7303 Prediabetes: Secondary | ICD-10-CM

## 2024-01-30 DIAGNOSIS — N970 Female infertility associated with anovulation: Secondary | ICD-10-CM | POA: Diagnosis not present

## 2024-01-30 DIAGNOSIS — Z319 Encounter for procreative management, unspecified: Secondary | ICD-10-CM

## 2024-01-30 LAB — T3, FREE: T3, Free: 3.8 pg/mL (ref 2.0–4.4)

## 2024-01-30 LAB — TSH+FREE T4
Free T4: 1 ng/dL (ref 0.82–1.77)
TSH: 2.5 u[IU]/mL (ref 0.450–4.500)

## 2024-01-30 MED ORDER — LETROZOLE 2.5 MG PO TABS: 2.5000 mg | ORAL_TABLET | Freq: Every day | ORAL | 1 refills | Status: DC

## 2024-01-30 NOTE — Progress Notes (Signed)
 GYN VISIT Patient name: Nancy Bishop MRN [id number]  Date of birth: 05/20/2001 Chief Complaint:   discuss getting pregnant (Ovulation tests negative, been on Clomid previously)  History of Present Illness:   Nancy Bishop is a 23 y.o. G60P1001 Caucasian female being seen today to discuss getting pregnant. Had to take clomid to conceive w/ last child. Has been doing OPKs at home since birth of last child, has never ovulated. Not taking pnv. Periods regular, last ~4-5d, mild cramping, no bloating/breast tenderness.  Does have to shave neck/chin daily.    Patient's last menstrual period was 01/13/2024 (exact date). The current method of family planning is none.  Last pap 04/18/21. Results were: NILM w/ HRHPV not done     01/29/2024    1:13 PM 10/25/2023   10:24 AM 06/19/2023    3:30 PM 05/28/2023    3:25 PM 04/15/2023   10:38 AM  Depression screen PHQ 2/9  Decreased Interest 0 1 0 1 1  Down, Depressed, Hopeless 0 0 0 1 0  PHQ - 2 Score 0 1 0 2 1  Altered sleeping 1 2  2 2   Tired, decreased energy 1 1  2 1   Change in appetite 1 1  2  0  Feeling bad or failure about yourself  0 2  0 0  Trouble concentrating 1 0  2 2  Moving slowly or fidgety/restless 0 0  0 0  Suicidal thoughts 0 0  0 0  PHQ-9 Score 4 7  10 6   Difficult doing work/chores Not difficult at all Somewhat difficult  Somewhat difficult         01/29/2024    1:14 PM 10/25/2023   10:24 AM 02/25/2023    9:39 AM 11/26/2022    9:51 AM  GAD 7 : Generalized Anxiety Score  Nervous, Anxious, on Edge 1 1 1 1   Control/stop worrying 0 2 1 2   Worry too much - different things 0 2 1 1   Trouble relaxing 0 2 1 1   Restless 0 2 1 1   Easily annoyed or irritable 2 3 1 1   Afraid - awful might happen 0 1 1 2   Total GAD 7 Score 3 13 7 9   Anxiety Difficulty Not difficult at all Somewhat difficult Somewhat difficult Somewhat difficult     Review of Systems:   Pertinent items are noted in HPI Denies fever/chills, dizziness, headaches,  visual disturbances, fatigue, shortness of breath, chest pain, abdominal pain, vomiting, abnormal vaginal discharge/itching/odor/irritation, problems with periods, bowel movements, urination, or intercourse unless otherwise stated above.  Pertinent History Reviewed:  Reviewed past medical,surgical, social, obstetrical and family history.  Reviewed problem list, medications and allergies. Physical Assessment:   Vitals:   01/30/24 1118  BP: 119/86  Pulse: (!) 101  Weight: 260 lb 6.4 oz (118.1 kg)  Height: 5' (1.524 m)  Body mass index is 50.86 kg/m.       Physical Examination:   General appearance: alert, well appearing, and in no distress  Mental status: alert, oriented to person, place, and time  Skin: warm & dry, no acanthosis nigricans  Cardiovascular: normal heart rate noted  Respiratory: normal respiratory effort, no distress  Abdomen: soft, non-tender   Pelvic: examination not indicated  Extremities: no edema   Chaperone: N/A   Assessment & Plan:  1) Anovulation and desire for pregnancy> regular periods, but neg OPKs. Had to take clomid to conceive w/ last child. Discussed clomid vs femara, will try femara 1st. Rx 2.5mg  daily x  5d starting on day 3 of cycle. Continue OPKs, let me know if not ovulating and will increase next cycle. Start pnv. Discussed weight loss, offered referral to dietician, has been in past, will let me know if she wants to go again.  2) Prediabetes> A1C 6.1 ~22mth ago, repeat today. Declines metformin right now   Meds:  Meds ordered this encounter  Medications   letrozole (FEMARA) 2.5 MG tablet    Sig: Take 1 tablet (2.5 mg total) by mouth daily. X 5 days starting on day 3 of your cycle    Dispense:  5 tablet    Refill:  1    Orders Placed This Encounter  Procedures   Hemoglobin A1c    Return in about 3 months (around 05/01/2024) for Pap & physical.  Cheral Marker CNM, [facility] 01/30/2024 12:32 PM

## 2024-01-31 LAB — HEMOGLOBIN A1C
Est. average glucose Bld gHb Est-mCnc: 123 mg/dL
Hgb A1c MFr Bld: 5.9 % — ABNORMAL HIGH (ref 4.8–5.6)

## 2024-02-03 ENCOUNTER — Encounter: Payer: Self-pay | Admitting: Women's Health

## 2024-02-03 ENCOUNTER — Ambulatory Visit: Admitting: Women's Health

## 2024-02-03 DIAGNOSIS — R7303 Prediabetes: Secondary | ICD-10-CM | POA: Insufficient documentation

## 2024-02-10 ENCOUNTER — Ambulatory Visit (HOSPITAL_COMMUNITY)
Admission: RE | Admit: 2024-02-10 | Discharge: 2024-02-10 | Disposition: A | Discharge status: Home / Self Care | Source: Ambulatory Visit | Attending: Family Medicine | Admitting: Family Medicine

## 2024-02-10 DIAGNOSIS — R221 Localized swelling, mass and lump, neck: Secondary | ICD-10-CM | POA: Insufficient documentation

## 2024-02-13 ENCOUNTER — Other Ambulatory Visit: Payer: Self-pay

## 2024-02-13 DIAGNOSIS — R221 Localized swelling, mass and lump, neck: Secondary | ICD-10-CM

## 2024-02-15 ENCOUNTER — Encounter: Payer: Self-pay | Admitting: Family Medicine

## 2024-02-15 LAB — CBC WITH DIFFERENTIAL/PLATELET
Basophils Absolute: 0.1 10*3/uL (ref 0.0–0.2)
Basos: 1 %
EOS (ABSOLUTE): 0.2 10*3/uL (ref 0.0–0.4)
Eos: 2 %
Hematocrit: 43.4 % (ref 34.0–46.6)
Hemoglobin: 14.2 g/dL (ref 11.1–15.9)
Immature Grans (Abs): 0 10*3/uL (ref 0.0–0.1)
Immature Granulocytes: 0 %
Lymphocytes Absolute: 2.2 10*3/uL (ref 0.7–3.1)
Lymphs: 18 %
MCH: 27.6 pg (ref 26.6–33.0)
MCHC: 32.7 g/dL (ref 31.5–35.7)
MCV: 84 fL (ref 79–97)
Monocytes Absolute: 1.1 10*3/uL — ABNORMAL HIGH (ref 0.1–0.9)
Monocytes: 9 %
Neutrophils Absolute: 8.6 10*3/uL — ABNORMAL HIGH (ref 1.4–7.0)
Neutrophils: 70 %
Platelets: 339 10*3/uL (ref 150–450)
RBC: 5.14 x10E6/uL (ref 3.77–5.28)
RDW: 12.5 % (ref 11.7–15.4)
WBC: 12.1 10*3/uL — ABNORMAL HIGH (ref 3.4–10.8)

## 2024-02-16 ENCOUNTER — Encounter: Payer: Self-pay | Admitting: Family Medicine

## 2024-02-18 ENCOUNTER — Ambulatory Visit: Admitting: Family Medicine

## 2024-02-18 ENCOUNTER — Encounter: Payer: Self-pay | Admitting: Family Medicine

## 2024-02-18 VITALS — BP 121/84 | HR 100 | Temp 98.6°F | Ht 60.0 in | Wt 260.0 lb

## 2024-02-18 DIAGNOSIS — J0100 Acute maxillary sinusitis, unspecified: Secondary | ICD-10-CM | POA: Diagnosis not present

## 2024-02-18 DIAGNOSIS — J329 Chronic sinusitis, unspecified: Secondary | ICD-10-CM | POA: Insufficient documentation

## 2024-02-18 MED ORDER — DOXYCYCLINE HYCLATE 100 MG PO TABS: 100.0000 mg | ORAL_TABLET | Freq: Two times a day (BID) | ORAL | 0 refills | Status: DC

## 2024-02-18 NOTE — Assessment & Plan Note (Signed)
Treating with doxycycline. 

## 2024-02-18 NOTE — Progress Notes (Signed)
 Subjective:  Patient ID: Nancy Bishop, female    DOB: 2001-02-23  Age: 23 y.o. MRN: [id number]  CC:   Chief Complaint  Patient presents with   Follow-up    F/u US neck and labs    HPI:  23 year old female presents for follow up.  Recent US and labs were essentially normal.  Patient reassured about this.  Patient reports that she has been sick for the past week.  This may account for the lymph nodes that were noted on ultrasound although they were not enlarged.  She reports congestion, sinus pain and pressure, scratchy throat.  No fever.  No relieving factors.  She feels like she is experiencing sinusitis.  No other complaints at this time.  Patient Active Problem List   Diagnosis Date Noted   Sinusitis 02/18/2024   Prediabetes 02/03/2024   Patient desires pregnancy 01/29/2024   Eczema 08/28/2023   Chronic pain syndrome 06/26/2023   Insomnia 06/26/2023   Severe obesity (BMI >= 40) (HCC) 05/29/2023   Chronic low back pain 11/26/2022   Anxiety and depression 10/15/2022   Fibromyalgia 03/12/2018   Hypermobility syndrome 10/22/2016    Social Hx   Social History   Socioeconomic History   Marital status: Married    Spouse name: Elanah Osmanovic   Number of children: Not on file   Years of education: Not on file   Highest education level: Associate degree: academic program  Occupational History    Employer: [employer]  Tobacco Use   Smoking status: Never   Smokeless tobacco: Never  Vaping Use   Vaping status: Never Used  Substance and Sexual Activity   Alcohol use: Not Currently    Comment: occas   Drug use: Never   Sexual activity: Yes    Birth control/protection: None  Other Topics Concern   Not on file  Social History Narrative   ** Merged History Encounter **       Right handed  Caffeine- None  Live's at home with mom and dad     Social Drivers of Health   Financial Resource Strain: Low Risk  (01/28/2024)   Overall Financial Resource Strain  (CARDIA)    Difficulty of Paying Living Expenses: Not very hard  Food Insecurity: No Food Insecurity (01/28/2024)   Hunger Vital Sign    Worried About Running Out of Food in the Last Year: Never true    Ran Out of Food in the Last Year: Never true  Transportation Needs: No Transportation Needs (01/28/2024)   PRAPARE - Administrator, Civil Service (Medical): No    Lack of Transportation (Non-Medical): No  Physical Activity: Unknown (01/28/2024)   Exercise Vital Sign    Days of Exercise per Week: 0 days    Minutes of Exercise per Session: Not on file  Stress: No Stress Concern Present (01/28/2024)   [employer] of Occupational Health - Occupational Stress Questionnaire    Feeling of Stress : Only a little  Social Connections: Socially Integrated (01/28/2024)   Social Connection and Isolation Panel [NHANES]    Frequency of Communication with Friends and Family: More than three times a week    Frequency of Social Gatherings with Friends and Family: Twice a week    Attends Religious Services: More than 4 times per year    Active Member of [employer] or Organizations: Yes    Attends Engineer, structural: More than 4 times per year    Marital Status: Married  Review of Systems Per HPI  Objective:  BP 121/84   Pulse 100   Temp 98.6 F (37 C)   Ht 5' (1.524 m)   Wt 260 lb (117.9 kg)   LMP 01/13/2024 (Exact Date)   SpO2 98%   BMI 50.78 kg/m      02/18/2024    9:04 AM 01/30/2024   11:18 AM 01/29/2024    1:10 PM  BP/Weight  Systolic BP 121 119 118  Diastolic BP 84 86 88  Wt. (Lbs) 260 260.4 259  BMI 50.78 kg/m2 50.86 kg/m2 51.44 kg/m2    Physical Exam Vitals and nursing note reviewed.  Constitutional:      General: She is not in acute distress.    Appearance: Normal appearance. She is obese.  HENT:     Head: Normocephalic and atraumatic.     Nose: Congestion present.     Mouth/Throat:     Pharynx: Posterior oropharyngeal erythema present.   Cardiovascular:     Rate and Rhythm: Normal rate and regular rhythm.  Pulmonary:     Effort: Pulmonary effort is normal.     Breath sounds: Normal breath sounds. No wheezing, rhonchi or rales.  Neurological:     Mental Status: She is alert.     Lab Results  Component Value Date   WBC 12.1 (H) 02/14/2024   HGB 14.2 02/14/2024   HCT 43.4 02/14/2024   PLT 339 02/14/2024   GLUCOSE 117 (H) 06/26/2023   CHOL 164 10/15/2022   TRIG 225 (H) 10/15/2022   HDL 39 (L) 10/15/2022   LDLCALC 87 10/15/2022   ALT 14 06/26/2023   AST 14 06/26/2023   NA 138 06/26/2023   K 4.4 06/26/2023   CL 100 06/26/2023   CREATININE 0.64 06/26/2023   BUN 8 06/26/2023   CO2 24 06/26/2023   TSH 2.500 01/29/2024   HGBA1C 5.9 (H) 01/30/2024     Assessment & Plan:  Acute maxillary sinusitis, recurrence not specified Assessment & Plan: Treating with doxycycline.   Other orders -     Doxycycline Hyclate; Take 1 tablet (100 mg total) by mouth 2 (two) times daily. Take with food.  Dispense: 14 tablet; Refill: 0    Follow-up:  Return if symptoms worsen or fail to improve.  Everlene Other DO [facility]

## 2024-02-26 ENCOUNTER — Ambulatory Visit: Payer: Commercial Managed Care - PPO | Admitting: Family Medicine

## 2024-03-12 ENCOUNTER — Ambulatory Visit (INDEPENDENT_AMBULATORY_CARE_PROVIDER_SITE_OTHER): Admitting: Family Medicine

## 2024-03-12 ENCOUNTER — Encounter: Payer: Self-pay | Admitting: Family Medicine

## 2024-03-12 VITALS — BP 116/83 | HR 71 | Wt 256.8 lb

## 2024-03-12 DIAGNOSIS — R21 Rash and other nonspecific skin eruption: Secondary | ICD-10-CM

## 2024-03-12 DIAGNOSIS — R0602 Shortness of breath: Secondary | ICD-10-CM

## 2024-03-12 MED ORDER — KETOCONAZOLE 2 % EX CREA: TOPICAL_CREAM | CUTANEOUS | 0 refills | Status: DC

## 2024-03-12 MED ORDER — ALBUTEROL SULFATE HFA 108 (90 BASE) MCG/ACT IN AERS: 2.0000 | INHALATION_SPRAY | Freq: Four times a day (QID) | RESPIRATORY_TRACT | 0 refills | Status: DC | PRN

## 2024-03-12 NOTE — Patient Instructions (Signed)
 Lungs clear.  Medications as prescribed.  Take care  Dr. Debrah Fan

## 2024-03-13 DIAGNOSIS — R21 Rash and other nonspecific skin eruption: Secondary | ICD-10-CM | POA: Insufficient documentation

## 2024-03-13 DIAGNOSIS — R0602 Shortness of breath: Secondary | ICD-10-CM | POA: Insufficient documentation

## 2024-03-13 NOTE — Assessment & Plan Note (Signed)
 Suspected seborrheic dermatitis.  Treating with ketoconazole .

## 2024-03-13 NOTE — Assessment & Plan Note (Signed)
 Lungs clear.  No work of breathing.  No respiratory distress.  Albuterol  as needed.  Supportive care.

## 2024-03-13 NOTE — Progress Notes (Signed)
 Subjective:  Patient ID: Nancy Bishop, female    DOB: [birth year]  Age: 23 y.o. MRN: [id number]  CC:  Trouble getting a good breath   HPI:  23 year old female presents for evaluation of the above.  Patient states that her daughter has recently been sick.  Had negative testing.  She states that yesterday she felt as if she could not get a good deep breath.  No significant cough.  No fever.  She states that she also felt like she had a "rattle" in her chest.  Patient thought it be best that she be examined.  Additionally, patient reports a rash around the nose.  Itchy, red, and flaky.   Patient Active Problem List   Diagnosis Date Noted   Rash 03/13/2024   Shortness of breath 03/13/2024   Prediabetes 02/03/2024   Patient desires pregnancy 01/29/2024   Eczema 08/28/2023   Chronic pain syndrome 06/26/2023   Insomnia 06/26/2023   Severe obesity (BMI >= 40) (HCC) 05/29/2023   Chronic low back pain 11/26/2022   Anxiety and depression 10/15/2022   Fibromyalgia 03/12/2018   Hypermobility syndrome 10/22/2016    Social Hx   Social History   Socioeconomic History   Marital status: Married    Spouse name: Jasmien Badr   Number of children: Not on file   Years of education: Not on file   Highest education level: Associate degree: academic program  Occupational History    Employer: [employer]  Tobacco Use   Smoking status: Never   Smokeless tobacco: Never  Vaping Use   Vaping status: Never Used  Substance and Sexual Activity   Alcohol use: Not Currently    Comment: occas   Drug use: Never   Sexual activity: Yes    Birth control/protection: None  Other Topics Concern   Not on file  Social History Narrative   ** Merged History Encounter **       Right handed  Caffeine- None  Live's at home with mom and dad     Social Drivers of Health   Financial Resource Strain: Low Risk  (01/28/2024)   Overall Financial Resource Strain (CARDIA)    Difficulty of Paying  Living Expenses: Not very hard  Food Insecurity: No Food Insecurity (01/28/2024)   Hunger Vital Sign    Worried About Running Out of Food in the Last Year: Never true    Ran Out of Food in the Last Year: Never true  Transportation Needs: No Transportation Needs (01/28/2024)   PRAPARE - Administrator, Civil Service (Medical): No    Lack of Transportation (Non-Medical): No  Physical Activity: Unknown (01/28/2024)   Exercise Vital Sign    Days of Exercise per Week: 0 days    Minutes of Exercise per Session: Not on file  Stress: No Stress Concern Present (01/28/2024)   [employer] of Occupational Health - Occupational Stress Questionnaire    Feeling of Stress : Only a little  Social Connections: Socially Integrated (01/28/2024)   Social Connection and Isolation Panel [NHANES]    Frequency of Communication with Friends and Family: More than three times a week    Frequency of Social Gatherings with Friends and Family: Twice a week    Attends Religious Services: More than 4 times per year    Active Member of [employer] or Organizations: Yes    Attends Engineer, structural: More than 4 times per year    Marital Status: Married    Review of Systems Per  HPI  Objective:  BP 116/83   Pulse 71   Wt 256 lb 12.8 oz (116.5 kg)   SpO2 92%   BMI 50.15 kg/m      03/12/2024    3:52 PM 02/18/2024    9:04 AM 01/30/2024   11:18 AM  BP/Weight  Systolic BP 116 121 119  Diastolic BP 83 84 86  Wt. (Lbs) 256.8 260 260.4  BMI 50.15 kg/m2 50.78 kg/m2 50.86 kg/m2    Physical Exam Constitutional:      General: She is not in acute distress.    Appearance: She is obese.  HENT:     Head: Normocephalic and atraumatic.     Comments: Rash noted around the nose.  Slightly raised with erythema. Cardiovascular:     Rate and Rhythm: Regular rhythm. Tachycardia present.  Pulmonary:     Effort: Pulmonary effort is normal.     Breath sounds: No wheezing or rales.  Neurological:      Mental Status: She is alert.     Lab Results  Component Value Date   WBC 12.1 (H) 02/14/2024   HGB 14.2 02/14/2024   HCT 43.4 02/14/2024   PLT 339 02/14/2024   GLUCOSE 117 (H) 06/26/2023   CHOL 164 10/15/2022   TRIG 225 (H) 10/15/2022   HDL 39 (L) 10/15/2022   LDLCALC 87 10/15/2022   ALT 14 06/26/2023   AST 14 06/26/2023   NA 138 06/26/2023   K 4.4 06/26/2023   CL 100 06/26/2023   CREATININE 0.64 06/26/2023   BUN 8 06/26/2023   CO2 24 06/26/2023   TSH 2.500 01/29/2024   HGBA1C 5.9 (H) 01/30/2024     Assessment & Plan:  Shortness of breath Assessment & Plan: Lungs clear.  No work of breathing.  No respiratory distress.  Albuterol  as needed.  Supportive care.   Rash Assessment & Plan: Suspected seborrheic dermatitis.  Treating with ketoconazole .   Other orders -     Albuterol  Sulfate HFA; Inhale 2 puffs into the lungs every 6 (six) hours as needed for wheezing or shortness of breath.  Dispense: 8 g; Refill: 0 -     Ketoconazole ; Apply to the affected area twice daily for 4 weeks or until clinical response is noted.  Dispense: 30 g; Refill: 0    Follow-up:  Return if symptoms worsen or fail to improve.  Kathleen Papa DO [facility]

## 2024-03-15 ENCOUNTER — Encounter: Payer: Self-pay | Admitting: Emergency Medicine

## 2024-03-15 ENCOUNTER — Ambulatory Visit
Admission: EM | Admit: 2024-03-15 | Discharge: 2024-03-15 | Disposition: A | Discharge status: Home / Self Care | Attending: Family Medicine | Admitting: Family Medicine

## 2024-03-15 ENCOUNTER — Other Ambulatory Visit: Payer: Self-pay

## 2024-03-15 DIAGNOSIS — J3089 Other allergic rhinitis: Secondary | ICD-10-CM | POA: Diagnosis not present

## 2024-03-15 DIAGNOSIS — J069 Acute upper respiratory infection, unspecified: Secondary | ICD-10-CM | POA: Diagnosis not present

## 2024-03-15 LAB — POCT URINE PREGNANCY: Preg Test, Ur: NEGATIVE

## 2024-03-15 MED ORDER — FLUTICASONE PROPIONATE HFA 110 MCG/ACT IN AERO: 2.0000 | INHALATION_SPRAY | Freq: Two times a day (BID) | RESPIRATORY_TRACT | 12 refills | Status: DC

## 2024-03-15 MED ORDER — CETIRIZINE HCL 10 MG PO TABS: 10.0000 mg | ORAL_TABLET | Freq: Every day | ORAL | 2 refills | Status: DC

## 2024-03-15 MED ORDER — GUAIFENESIN ER 600 MG PO TB12: 600.0000 mg | ORAL_TABLET | Freq: Two times a day (BID) | ORAL | 0 refills | Status: DC | PRN

## 2024-03-15 NOTE — Discharge Instructions (Signed)
 In addition to the prescribed medications, you may use your albuterol  inhaler every 4 hours as needed, and take Delsym cough syrup.  You may also use nasal saline, humidifiers and vapor rubs.

## 2024-03-15 NOTE — ED Triage Notes (Addendum)
 Pt reports was seen for similar at PCP office on 5/1. Pt reports "breathing problems was attributed to anxiety at that visit". Pt reports continued cough, nasal congestion x6 days. Inhaler and breathing treatment has helped "to relax me enough to sleep."

## 2024-03-16 ENCOUNTER — Encounter: Payer: Self-pay | Admitting: Women's Health

## 2024-03-18 ENCOUNTER — Other Ambulatory Visit: Payer: Self-pay | Admitting: Women's Health

## 2024-03-18 MED ORDER — LETROZOLE 2.5 MG PO TABS: 5.0000 mg | ORAL_TABLET | Freq: Every day | ORAL | 1 refills | Status: DC

## 2024-03-19 NOTE — ED Provider Notes (Signed)
 [facility]    CSN: [id number] Arrival date & time: 03/15/24  0801      History   Chief Complaint Chief Complaint  Patient presents with   Cough    HPI Nancy Bishop is a 23 y.o. female.   Patient presenting today with 4-day history of cough, congestion, chest tightness.  Denies fever, chills, chest pain, abdominal pain, vomiting, diarrhea.  So far trying albuterol  inhaler which does help for short periods, this was given by PCP at visit on 03/12/2024 for the same.  She states past history of exercise-induced asthma, seasonal allergies.    Past Medical History:  Diagnosis Date   Fibromyalgia    Hypermobility syndrome 10/22/2016   Numbness and tingling     Patient Active Problem List   Diagnosis Date Noted   Rash 03/13/2024   Shortness of breath 03/13/2024   Prediabetes 02/03/2024   Patient desires pregnancy 01/29/2024   Eczema 08/28/2023   Chronic pain syndrome 06/26/2023   Insomnia 06/26/2023   Severe obesity (BMI >= 40) (HCC) 05/29/2023   Chronic low back pain 11/26/2022   Anxiety and depression 10/15/2022   Fibromyalgia 03/12/2018   Hypermobility syndrome 10/22/2016    Past Surgical History:  Procedure Laterality Date   WISDOM TOOTH EXTRACTION  02/2018   WISDOM TOOTH EXTRACTION      OB History     Gravida  1   Para  1   Term  1   Preterm  0   AB  0   Living  1      SAB  0   IAB  0   Ectopic  0   Multiple      Live Births  1            Home Medications    Prior to Admission medications   Medication Sig Start Date End Date Taking? Authorizing Provider  cetirizine (ZYRTEC ALLERGY) 10 MG tablet Take 1 tablet (10 mg total) by mouth daily. 03/15/24  Yes Corbin Dess, PA-C  fluticasone  (FLOVENT  HFA) 110 MCG/ACT inhaler Inhale 2 puffs into the lungs 2 (two) times daily. Rinse mouth with water after each use 03/15/24  Yes Corbin Dess, PA-C  guaiFENesin (MUCINEX) 600 MG 12 hr tablet Take 1 tablet (600 mg  total) by mouth 2 (two) times daily as needed. 03/15/24  Yes Corbin Dess, PA-C  albuterol  (VENTOLIN  HFA) 108 [phone] Base) MCG/ACT inhaler Inhale 2 puffs into the lungs every 6 (six) hours as needed for wheezing or shortness of breath. 03/12/24   Cook, Jayce G, DO  doxycycline  (VIBRA -TABS) 100 MG tablet Take 1 tablet (100 mg total) by mouth 2 (two) times daily. Take with food. Patient not taking: Reported on 03/12/2024 02/18/24   Cook, Jayce G, DO  ketoconazole  (NIZORAL ) 2 % cream Apply to the affected area twice daily for 4 weeks or until clinical response is noted. 03/12/24   Cook, Jayce G, DO  letrozole  (FEMARA ) 2.5 MG tablet Take 2 tablets (5 mg total) by mouth daily. X 5 days starting on day 3 of your cycle 03/18/24   Ferd Householder, CNM  montelukast (SINGULAIR) 10 MG tablet Take 10 mg by mouth at bedtime. Patient not taking: Reported on 03/15/2024 02/15/24   [provider]  triamcinolone  ointment (KENALOG ) 0.5 % Apply 1 Application topically 2 (two) times daily as needed. 08/28/23   Cook, Jayce G, DO    Family History Family History  Problem Relation Age of Onset  Diabetes Father    Diabetes Maternal Grandmother    Hypertension Paternal Grandmother    Diabetes Paternal Grandmother    Lung cancer Paternal Grandmother    Heart failure Paternal Grandmother    Asthma Paternal Grandfather    COPD Paternal Grandfather    Dementia Paternal Grandfather    Other Father    Healthy Brother    Cervical cancer Maternal Grandmother    Alzheimer's disease Paternal Grandfather     Social History Social History   Tobacco Use   Smoking status: Never   Smokeless tobacco: Never  Vaping Use   Vaping status: Never Used  Substance Use Topics   Alcohol use: Not Currently    Comment: occas   Drug use: Never     Allergies   Wasp venom, Bee venom, Latex, Latex, and Tape   Review of Systems Review of Systems PER HPI  Physical Exam Triage Vital Signs ED Triage Vitals  Encounter  Vitals Group     BP 03/15/24 0831 117/81     Systolic BP Percentile --      Diastolic BP Percentile --      Pulse Rate 03/15/24 0831 96     Resp 03/15/24 0831 20     Temp 03/15/24 0831 98.4 F (36.9 C)     Temp Source 03/15/24 0831 Oral     SpO2 03/15/24 0831 96 %     Weight --      Height --      Head Circumference --      Peak Flow --      Pain Score 03/15/24 0828 2     Pain Loc --      Pain Education --      Exclude from Growth Chart --    No data found.  Updated Vital Signs BP 117/81 (BP Location: Right Arm)   Pulse 96   Temp 98.4 F (36.9 C) (Oral)   Resp 20   LMP 02/14/2024 (Approximate)   SpO2 96%   Breastfeeding No   Visual Acuity Right Eye Distance:   Left Eye Distance:   Bilateral Distance:    Right Eye Near:   Left Eye Near:    Bilateral Near:     Physical Exam Vitals and nursing note reviewed.  Constitutional:      Appearance: Normal appearance. She is not ill-appearing.  HENT:     Head: Atraumatic.     Right Ear: Tympanic membrane normal.     Left Ear: Tympanic membrane normal.     Nose: Rhinorrhea present.     Mouth/Throat:     Mouth: Mucous membranes are moist.     Pharynx: Oropharynx is clear. No posterior oropharyngeal erythema.  Eyes:     Extraocular Movements: Extraocular movements intact.     Conjunctiva/sclera: Conjunctivae normal.  Cardiovascular:     Rate and Rhythm: Normal rate and regular rhythm.     Heart sounds: Normal heart sounds.  Pulmonary:     Effort: Pulmonary effort is normal.     Breath sounds: Normal breath sounds. No wheezing or rales.  Musculoskeletal:        General: Normal range of motion.     Cervical back: Normal range of motion and neck supple.  Skin:    General: Skin is warm and dry.  Neurological:     Mental Status: She is alert and oriented to person, place, and time.  Psychiatric:        Mood and Affect: Mood normal.  Thought Content: Thought content normal.        Judgment: Judgment normal.       UC Treatments / Results  Labs (all labs ordered are listed, but only abnormal results are displayed) Labs Reviewed  POCT URINE PREGNANCY - Normal    EKG   Radiology No results found.  Procedures Procedures (including critical care time)  Medications Ordered in UC Medications - No data to display  Initial Impression / Assessment and Plan / UC Course  I have reviewed the triage vital signs and the nursing notes.  Pertinent labs & imaging results that were available during my care of the patient were reviewed by me and considered in my medical decision making (see chart for details).     Vital signs and exam very reassuring today, she is well-appearing and in no acute distress.  Suspect seasonal allergies and possible viral respiratory infection causing symptoms.  She could be having some reactive airway exacerbation additionally, particularly since she is getting a bit of relief from the albuterol  inhaler as needed.  Pregnancy test was ordered as her menstrual cycle is due to come in the next day or so and she is currently on Femara  trying to conceive.  There was no definitive positive though possible faint shadow present.  Discussed with patient that we would call the test negative at this time but that it may be too early to definitively tell so to continue checking at home.  Will also forego any potentially pregnancy unsafe medications as she is actively trying to conceive.  Trial Flovent , Flonase , Zyrtec, supportive over-the-counter medications and home care.  Return for worsening symptoms.  Final Clinical Impressions(s) / UC Diagnoses   Final diagnoses:  Viral URI with cough  Seasonal allergic rhinitis due to other allergic trigger     Discharge Instructions      In addition to the prescribed medications, you may use your albuterol  inhaler every 4 hours as needed, and take Delsym cough syrup.  You may also use nasal saline, humidifiers and vapor rubs.  ED  Prescriptions     Medication Sig Dispense Auth. Provider   fluticasone  (FLOVENT  HFA) 110 MCG/ACT inhaler Inhale 2 puffs into the lungs 2 (two) times daily. Rinse mouth with water after each use 1 each Corbin Dess, PA-C   cetirizine (ZYRTEC ALLERGY) 10 MG tablet Take 1 tablet (10 mg total) by mouth daily. 30 tablet Corbin Dess, PA-C   guaiFENesin (MUCINEX) 600 MG 12 hr tablet Take 1 tablet (600 mg total) by mouth 2 (two) times daily as needed. 20 tablet Corbin Dess, [place]      PDMP not reviewed this encounter.   Corbin Dess, [place] 03/19/24 [phone]

## 2024-03-31 ENCOUNTER — Encounter: Payer: Self-pay | Admitting: Family Medicine

## 2024-04-13 ENCOUNTER — Other Ambulatory Visit: Payer: Self-pay | Admitting: Women's Health

## 2024-04-13 MED ORDER — LETROZOLE 2.5 MG PO TABS: 7.5000 mg | ORAL_TABLET | Freq: Every day | ORAL | 1 refills | Status: DC

## 2024-05-03 ENCOUNTER — Encounter: Payer: Self-pay | Admitting: Family Medicine

## 2024-05-04 ENCOUNTER — Other Ambulatory Visit: Payer: Self-pay | Admitting: Women's Health

## 2024-05-05 ENCOUNTER — Encounter: Payer: Self-pay | Admitting: Family Medicine

## 2024-05-05 ENCOUNTER — Ambulatory Visit (INDEPENDENT_AMBULATORY_CARE_PROVIDER_SITE_OTHER): Admitting: Family Medicine

## 2024-05-05 VITALS — HR 90 | Temp 98.6°F | Ht 60.0 in | Wt 256.0 lb

## 2024-05-05 DIAGNOSIS — L601 Onycholysis: Secondary | ICD-10-CM

## 2024-05-05 NOTE — Patient Instructions (Signed)
 Message with concerns/issues.  Take care  Dr. Bluford

## 2024-05-05 NOTE — Progress Notes (Signed)
 Subjective:  Patient ID: Nancy Bishop, female    DOB: 2001/01/16  Age: 23 y.o. MRN: [id number]  CC:   Chief Complaint  Patient presents with   Nail Problem    Had artificial nails on. Nails are now separating from nail bed. Has tried cutting nails down. Concern for nail fungus.     HPI:  23 year old female presents for evaluation of the above.  Patient states that she is concerned about her nails.  She states that her nails are separating from the nailbed.  She has a longstanding history of using acrylic nails.  She has stopped doing this.  She is concerned that she has nail fungus.  She wants to be evaluated for this.  She has no other complaints or concerns at this time.  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   Rash 03/13/2024   Shortness of breath 03/13/2024   Prediabetes 02/03/2024   Patient desires pregnancy 01/29/2024   Eczema 08/28/2023   Chronic pain syndrome 06/26/2023   Insomnia 06/26/2023   Severe obesity (BMI >= 40) (HCC) 05/29/2023   Chronic low back pain 11/26/2022   Anxiety and depression 10/15/2022   Fibromyalgia 03/12/2018   Hypermobility syndrome 10/22/2016    Social Hx   Social History   Socioeconomic History   Marital status: Married    Spouse name: Kalany Diekmann   Number of children: Not on file   Years of education: Not on file   Highest education level: Associate degree: academic program  Occupational History    Employer: [employer]  Tobacco Use   Smoking status: Never   Smokeless tobacco: Never  Vaping Use   Vaping status: Never Used  Substance and Sexual Activity   Alcohol use: Not Currently    Comment: occas   Drug use: Never   Sexual activity: Yes    Birth control/protection: None  Other Topics Concern   Not on file  Social History Narrative   ** Merged History Encounter **       Right handed  Caffeine- None  Live's at home with mom and dad     Social Drivers of Health    Financial Resource Strain: Low Risk  (01/28/2024)   Overall Financial Resource Strain (CARDIA)    Difficulty of Paying Living Expenses: Not very hard  Food Insecurity: No Food Insecurity (01/28/2024)   Hunger Vital Sign    Worried About Running Out of Food in the Last Year: Never true    Ran Out of Food in the Last Year: Never true  Transportation Needs: No Transportation Needs (01/28/2024)   PRAPARE - Administrator, Civil Service (Medical): No    Lack of Transportation (Non-Medical): No  Physical Activity: Unknown (01/28/2024)   Exercise Vital Sign    Days of Exercise per Week: 0 days    Minutes of Exercise per Session: Not on file  Stress: No Stress Concern Present (01/28/2024)   [employer] of Occupational Health - Occupational Stress Questionnaire    Feeling of Stress : Only a little  Social Connections: Socially Integrated (01/28/2024)   Social Connection and Isolation Panel    Frequency of Communication with Friends and Family: More than three times a week    Frequency of Social Gatherings with Friends and Family: Twice a week    Attends Religious Services: More than 4 times per year    Active Member of [employer] or Organizations: Yes    Attends Club or  Organization Meetings: More than 4 times per year    Marital Status: Married    Review of Systems Per HPI  Objective:  Pulse 90   Temp 98.6 F (37 C)   Ht 5' (1.524 m)   Wt 256 lb (116.1 kg)   SpO2 97%   BMI 50.00 kg/m      05/05/2024    8:36 AM 03/15/2024    8:31 AM 03/12/2024    3:52 PM  BP/Weight  Systolic BP  117 883  Diastolic BP  81 83  Wt. (Lbs) 256  256.8  BMI 50 kg/m2  50.15 kg/m2    Physical Exam Vitals and nursing note reviewed.  Constitutional:      Appearance: Normal appearance. She is obese.  HENT:     Head: Normocephalic and atraumatic.   Skin:    Comments: Mild onycholysis noted of several of the fingernails.   Neurological:     Mental Status: She is alert.     Lab  Results  Component Value Date   WBC 12.1 (H) 02/14/2024   HGB 14.2 02/14/2024   HCT 43.4 02/14/2024   PLT 339 02/14/2024   GLUCOSE 117 (H) 06/26/2023   CHOL 164 10/15/2022   TRIG 225 (H) 10/15/2022   HDL 39 (L) 10/15/2022   LDLCALC 87 10/15/2022   ALT 14 06/26/2023   AST 14 06/26/2023   NA 138 06/26/2023   K 4.4 06/26/2023   CL 100 06/26/2023   CREATININE 0.64 06/26/2023   BUN 8 06/26/2023   CO2 24 06/26/2023   TSH 2.500 01/29/2024   HGBA1C 5.9 (H) 01/30/2024     Assessment & Plan:  Onycholysis Assessment & Plan: Supportive care.     Follow-up:  Return if symptoms worsen or fail to improve.  Jacqulyn Ahle DO [facility]

## 2024-05-05 NOTE — Assessment & Plan Note (Signed)
 Supportive care

## 2024-05-19 ENCOUNTER — Encounter: Payer: Self-pay | Admitting: *Deleted

## 2024-05-19 ENCOUNTER — Ambulatory Visit: Admitting: *Deleted

## 2024-05-19 VITALS — BP 123/83 | HR 108

## 2024-05-19 DIAGNOSIS — Z3A01 Less than 8 weeks gestation of pregnancy: Secondary | ICD-10-CM

## 2024-05-19 DIAGNOSIS — Z3201 Encounter for pregnancy test, result positive: Secondary | ICD-10-CM | POA: Diagnosis not present

## 2024-05-19 LAB — POCT URINE PREGNANCY: Preg Test, Ur: POSITIVE — AB

## 2024-05-19 NOTE — Progress Notes (Signed)
   NURSE VISIT- PREGNANCY CONFIRMATION   SUBJECTIVE:  Nancy Bishop is a 23 y.o. G70P1001 female at [redacted]w[redacted]d by certain LMP of Patient's last menstrual period was 04/14/2024 (exact date). Here for pregnancy confirmation.  Home pregnancy test: positive x 7  She reports nausea.  She is taking prenatal vitamins.    OBJECTIVE:  BP 123/83 (BP Location: Right Arm, Patient Position: Sitting, Cuff Size: Large)   Pulse (!) 108   LMP 04/14/2024 (Exact Date)   Appears well, in no apparent distress  Results for orders placed or performed in visit on 05/19/24 (from the past 24 hours)  POCT urine pregnancy   Collection Time: 05/19/24 11:28 AM  Result Value Ref Range   Preg Test, Ur Positive (A) Negative    ASSESSMENT: Positive pregnancy test, [redacted]w[redacted]d by LMP    PLAN: Schedule for dating ultrasound in 2-3 weeks Prenatal vitamins: continue   Nausea medicines: not currently needed   OB packet given: Yes  Alan LITTIE Fischer  05/19/2024 11:31 AM

## 2024-05-23 DIAGNOSIS — Z419 Encounter for procedure for purposes other than remedying health state, unspecified: Secondary | ICD-10-CM | POA: Diagnosis not present

## 2024-06-12 ENCOUNTER — Other Ambulatory Visit

## 2024-06-16 ENCOUNTER — Other Ambulatory Visit: Payer: Self-pay | Admitting: Obstetrics & Gynecology

## 2024-06-16 DIAGNOSIS — O3680X1 Pregnancy with inconclusive fetal viability, fetus 1: Secondary | ICD-10-CM

## 2024-06-17 ENCOUNTER — Encounter: Payer: Self-pay | Admitting: Women's Health

## 2024-06-17 ENCOUNTER — Ambulatory Visit (INDEPENDENT_AMBULATORY_CARE_PROVIDER_SITE_OTHER)

## 2024-06-17 ENCOUNTER — Other Ambulatory Visit: Payer: Self-pay | Admitting: Obstetrics & Gynecology

## 2024-06-17 DIAGNOSIS — Z3A09 9 weeks gestation of pregnancy: Secondary | ICD-10-CM | POA: Diagnosis not present

## 2024-06-17 DIAGNOSIS — Z3491 Encounter for supervision of normal pregnancy, unspecified, first trimester: Secondary | ICD-10-CM | POA: Diagnosis not present

## 2024-06-17 DIAGNOSIS — O3680X1 Pregnancy with inconclusive fetal viability, fetus 1: Secondary | ICD-10-CM

## 2024-06-17 NOTE — Progress Notes (Signed)
 US  DC/DA twins,normal ovaries  BABY A: inferior,CRL 24.08 mm,FHR 182 bpm BABY B: superior,CRL 11.23 mm,no fetal heart tones visualized  Nancy Bishop discussed results with patient,follow up ultrasound scheduled for 1 wk

## 2024-06-19 ENCOUNTER — Other Ambulatory Visit: Payer: Self-pay | Admitting: Adult Health

## 2024-06-19 DIAGNOSIS — O3680X2 Pregnancy with inconclusive fetal viability, fetus 2: Secondary | ICD-10-CM

## 2024-06-23 DIAGNOSIS — Z419 Encounter for procedure for purposes other than remedying health state, unspecified: Secondary | ICD-10-CM | POA: Diagnosis not present

## 2024-06-24 ENCOUNTER — Ambulatory Visit: Admitting: Adult Health

## 2024-06-24 ENCOUNTER — Encounter: Payer: Self-pay | Admitting: Adult Health

## 2024-06-24 ENCOUNTER — Ambulatory Visit (INDEPENDENT_AMBULATORY_CARE_PROVIDER_SITE_OTHER)

## 2024-06-24 VITALS — BP 110/75 | HR 97 | Ht 60.0 in | Wt 252.5 lb

## 2024-06-24 DIAGNOSIS — O30041 Twin pregnancy, dichorionic/diamniotic, first trimester: Secondary | ICD-10-CM | POA: Diagnosis not present

## 2024-06-24 DIAGNOSIS — O30001 Twin pregnancy, unspecified number of placenta and unspecified number of amniotic sacs, first trimester: Secondary | ICD-10-CM

## 2024-06-24 DIAGNOSIS — O208 Other hemorrhage in early pregnancy: Secondary | ICD-10-CM | POA: Insufficient documentation

## 2024-06-24 DIAGNOSIS — Z3A10 10 weeks gestation of pregnancy: Secondary | ICD-10-CM

## 2024-06-24 DIAGNOSIS — O3680X2 Pregnancy with inconclusive fetal viability, fetus 2: Secondary | ICD-10-CM

## 2024-06-24 DIAGNOSIS — O039 Complete or unspecified spontaneous abortion without complication: Secondary | ICD-10-CM | POA: Insufficient documentation

## 2024-06-24 NOTE — Progress Notes (Signed)
  Subjective:     Patient ID: Nancy Bishop, female   DOB: April 26, 2001, 23 y.o.   MRN: [id number]  HPI Nancy Bishop is a 23 year old white female, married, G2P1001, at 10+weeks pregnant, back in for follow up US , she had US  in office 06/17/24 and Baby B had nor FHM and was smaller in size. Today, no FHM Baby B and has subchorionic bleed. Baby A Has +FHM  and appropriate growth.     Component Value Date/Time   DIAGPAP  04/18/2021 1034    - Negative for intraepithelial lesion or malignancy (NILM)   ADEQPAP  04/18/2021 1034    Satisfactory for evaluation; transformation zone component PRESENT.    Review of Systems She denies any bleeding Reviewed past medical,surgical, social and family history. Reviewed medications and allergies.     Objective:   Physical Exam BP 110/75 (BP Location: Right Arm, Patient Position: Sitting, Cuff Size: Large)   Pulse 97   Ht 5' (1.524 m)   Wt 252 lb 8 oz (114.5 kg)   LMP 04/14/2024 (Exact Date)   BMI 49.31 kg/m     Skin warm and dry.Lungs: clear to ausculation bilaterally. Cardiovascular: regular rate and rhythm.  Reviewed US  findings with pt and her husband.   [facility] - 06/24/24 1251       Pregnancy Intention Screening   Does the patient want to become pregnant in the next year? N/A    Does the patient's partner want to become pregnant in the next year? N/A      Contraception Wrap Up   Current Method Pregnant/Seeking Pregnancy    End Method Pregnant/Seeking Pregnancy    Contraception Counseling Provided No    How was the end contraceptive method provided? N/A          Assessment:     1. Twin gestation in first trimester, unspecified multiple gestation type (Primary)  2. Fetal demise due to miscarriage No fetal heart motion Baby B She is aware she may or may not have any bleeding   3. Subchorionic hemorrhage of placenta in first trimester Has at baby B No sex for now     Plan:     Return 07/01/24 for new OB visit and labs

## 2024-06-24 NOTE — Progress Notes (Signed)
 US  10+1 wks,DC/DA twins,normal ovaries,subchorionic hemorrhage 5.2 X 4 x 1.5 cm (around baby B gestational sac) BABY A: 10+1 wks,FHR 180 bpm,CRL 31.65 mm BABY B:  7+2 wks,no fetal heart tones,CRL 11.38 mm

## 2024-06-30 ENCOUNTER — Encounter: Payer: Self-pay | Admitting: Advanced Practice Midwife

## 2024-06-30 DIAGNOSIS — O0990 Supervision of high risk pregnancy, unspecified, unspecified trimester: Secondary | ICD-10-CM | POA: Insufficient documentation

## 2024-06-30 DIAGNOSIS — Z3490 Encounter for supervision of normal pregnancy, unspecified, unspecified trimester: Secondary | ICD-10-CM | POA: Insufficient documentation

## 2024-06-30 DIAGNOSIS — O0992 Supervision of high risk pregnancy, unspecified, second trimester: Secondary | ICD-10-CM | POA: Insufficient documentation

## 2024-07-01 ENCOUNTER — Encounter: Payer: Self-pay | Admitting: Advanced Practice Midwife

## 2024-07-01 ENCOUNTER — Other Ambulatory Visit (HOSPITAL_COMMUNITY)
Admission: RE | Admit: 2024-07-01 | Discharge: 2024-07-01 | Disposition: A | Discharge status: Home / Self Care | Source: Ambulatory Visit | Attending: Advanced Practice Midwife | Admitting: Advanced Practice Midwife

## 2024-07-01 ENCOUNTER — Ambulatory Visit: Admitting: Advanced Practice Midwife

## 2024-07-01 ENCOUNTER — Encounter: Admitting: *Deleted

## 2024-07-01 VITALS — BP 125/81 | Wt 253.0 lb

## 2024-07-01 DIAGNOSIS — Z3A11 11 weeks gestation of pregnancy: Secondary | ICD-10-CM | POA: Insufficient documentation

## 2024-07-01 DIAGNOSIS — Z3481 Encounter for supervision of other normal pregnancy, first trimester: Secondary | ICD-10-CM | POA: Diagnosis not present

## 2024-07-01 DIAGNOSIS — Z3480 Encounter for supervision of other normal pregnancy, unspecified trimester: Secondary | ICD-10-CM | POA: Diagnosis present

## 2024-07-01 DIAGNOSIS — Z131 Encounter for screening for diabetes mellitus: Secondary | ICD-10-CM

## 2024-07-01 DIAGNOSIS — M25551 Pain in right hip: Secondary | ICD-10-CM

## 2024-07-01 DIAGNOSIS — Z113 Encounter for screening for infections with a predominantly sexual mode of transmission: Secondary | ICD-10-CM | POA: Insufficient documentation

## 2024-07-01 DIAGNOSIS — Z363 Encounter for antenatal screening for malformations: Secondary | ICD-10-CM

## 2024-07-01 DIAGNOSIS — O039 Complete or unspecified spontaneous abortion without complication: Secondary | ICD-10-CM

## 2024-07-01 MED ORDER — CYCLOBENZAPRINE HCL 10 MG PO TABS: 10.0000 mg | ORAL_TABLET | Freq: Three times a day (TID) | ORAL | 1 refills | Status: AC | PRN

## 2024-07-01 MED ORDER — ASPIRIN 81 MG PO CHEW: 162.0000 mg | CHEWABLE_TABLET | Freq: Every day | ORAL | 7 refills | Status: AC

## 2024-07-01 NOTE — Patient Instructions (Signed)
 Nancy Bishop, thank you for choosing our office today! We appreciate the opportunity to meet your healthcare needs. You may receive a short survey by mail, e-mail, or through [employer]. If you are happy with your care we would appreciate if you could take just a few minutes to complete the survey questions. We read all of your comments and take your feedback very seriously. Thank you again for choosing our office.  [facility] [employer] [facility] at [facility]  [facility] at [facility] ([address] [place], [place] [address]) Entrance C, located off of [address] Free 24/7 valet parking   Nausea & Vomiting Have saltine crackers or pretzels by your bed and eat a few bites before you raise your head out of bed in the morning Eat small frequent meals throughout the day instead of large meals Drink plenty of fluids throughout the day to stay hydrated, just don't drink a lot of fluids with your meals.  This can make your stomach fill up faster making you feel sick Do not brush your teeth right after you eat Products with real ginger are good for nausea, like ginger ale and ginger hard candy Make sure it says made with real ginger! Sucking on sour candy like lemon heads is also good for nausea If your prenatal vitamins make you nauseated, take them at night so you will sleep through the nausea Sea Bands If you feel like you need medicine for the nausea & vomiting please let us  know If you are unable to keep any fluids or food down please let us  know   Constipation Drink plenty of fluid, preferably water, throughout the day Eat foods high in fiber such as fruits, vegetables, and grains Exercise, such as walking, is a good way to keep your bowels regular Drink warm fluids, especially warm prune juice, or decaf coffee Eat a 1/2 cup of real oatmeal (not instant), 1/2 cup applesauce, and 1/2-1 cup warm prune juice every day If needed, you may take Colace (docusate sodium ) stool softener  once or twice a day to help keep the stool soft.  If you still are having problems with constipation, you may take Miralax once daily as needed to help keep your bowels regular.   Home Blood Pressure Monitoring for Patients   Your provider has recommended that you check your blood pressure (BP) at least once a week at home. If you do not have a blood pressure cuff at home, one will be provided for you. Contact your provider if you have not received your monitor within 1 week.   Helpful Tips for Accurate Home Blood Pressure Checks  Don't smoke, exercise, or drink caffeine 30 minutes before checking your BP Use the restroom before checking your BP (a full bladder can raise your pressure) Relax in a comfortable upright chair Feet on the ground Left arm resting comfortably on a flat surface at the level of your heart Legs uncrossed Back supported Sit quietly and don't talk Place the cuff on your bare arm Adjust snuggly, so that only two fingertips can fit between your skin and the top of the cuff Check 2 readings separated by at least one minute Keep a log of your BP readings For a visual, please reference this diagram: http://ccnc.care/bpdiagram  Provider Name: [facility]     Phone: [phone]  Zone 1: ALL CLEAR  Continue to monitor your symptoms:  BP reading is less than 140 (top number) or less than 90 (bottom  number)  No right upper stomach pain No headaches or seeing spots No feeling nauseated or throwing up No swelling in face and hands  Zone 2: CAUTION Call your doctor's office for any of the following:  BP reading is greater than 140 (top number) or greater than 90 (bottom number)  Stomach pain under your ribs in the middle or right side Headaches or seeing spots Feeling nauseated or throwing up Swelling in face and hands  Zone 3: EMERGENCY  Seek immediate medical care if you have any of the following:  BP reading is greater than160 (top number) or greater than  110 (bottom number) Severe headaches not improving with Tylenol  Serious difficulty catching your breath Any worsening symptoms from Zone 2    First Trimester of Pregnancy The first trimester of pregnancy is from week 1 until the end of week 12 (months 1 through 3). A week after a sperm fertilizes an egg, the egg will implant on the wall of the uterus. This embryo will begin to develop into a baby. Genes from you and your partner are forming the baby. The female genes determine whether the baby is a boy or a girl. At 6-8 weeks, the eyes and face are formed, and the heartbeat can be seen on ultrasound. At the end of 12 weeks, all the baby's organs are formed.  Now that you are pregnant, you will want to do everything you can to have a healthy baby. Two of the most important things are to get good prenatal care and to follow your health care provider's instructions. Prenatal care is all the medical care you receive before the baby's birth. This care will help prevent, find, and treat any problems during the pregnancy and childbirth. BODY CHANGES Your body goes through many changes during pregnancy. The changes vary from woman to woman.  You may gain or lose a couple of pounds at first. You may feel sick to your stomach (nauseous) and throw up (vomit). If the vomiting is uncontrollable, call your health care provider. You may tire easily. You may develop headaches that can be relieved by medicines approved by your health care provider. You may urinate more often. Painful urination may mean you have a bladder infection. You may develop heartburn as a result of your pregnancy. You may develop constipation because certain hormones are causing the muscles that push waste through your intestines to slow down. You may develop hemorrhoids or swollen, bulging veins (varicose veins). Your breasts may begin to grow larger and become tender. Your nipples may stick out more, and the tissue that surrounds them  (areola) may become darker. Your gums may bleed and may be sensitive to brushing and flossing. Dark spots or blotches (chloasma, mask of pregnancy) may develop on your face. This will likely fade after the baby is born. Your menstrual periods will stop. You may have a loss of appetite. You may develop cravings for certain kinds of food. You may have changes in your emotions from day to day, such as being excited to be pregnant or being concerned that something may go wrong with the pregnancy and baby. You may have more vivid and strange dreams. You may have changes in your hair. These can include thickening of your hair, rapid growth, and changes in texture. Some women also have hair loss during or after pregnancy, or hair that feels dry or thin. Your hair will most likely return to normal after your baby is born. WHAT TO EXPECT AT YOUR PRENATAL  VISITS During a routine prenatal visit: You will be weighed to make sure you and the baby are growing normally. Your blood pressure will be taken. Your abdomen will be measured to track your baby's growth. The fetal heartbeat will be listened to starting around week 10 or 12 of your pregnancy. Test results from any previous visits will be discussed. Your health care provider may ask you: How you are feeling. If you are feeling the baby move. If you have had any abnormal symptoms, such as leaking fluid, bleeding, severe headaches, or abdominal cramping. If you have any questions. Other tests that may be performed during your first trimester include: Blood tests to find your blood type and to check for the presence of any previous infections. They will also be used to check for low iron levels (anemia) and Rh antibodies. Later in the pregnancy, blood tests for diabetes will be done along with other tests if problems develop. Urine tests to check for infections, diabetes, or protein in the urine. An ultrasound to confirm the proper growth and development  of the baby. An amniocentesis to check for possible genetic problems. Fetal screens for spina bifida and Down syndrome. You may need other tests to make sure you and the baby are doing well. HOME CARE INSTRUCTIONS  Medicines Follow your health care provider's instructions regarding medicine use. Specific medicines may be either safe or unsafe to take during pregnancy. Take your prenatal vitamins as directed. If you develop constipation, try taking a stool softener if your health care provider approves. Diet Eat regular, well-balanced meals. Choose a variety of foods, such as meat or vegetable-based protein, fish, milk and low-fat dairy products, vegetables, fruits, and whole grain breads and cereals. Your health care provider will help you determine the amount of weight gain that is right for you. Avoid raw meat and uncooked cheese. These carry germs that can cause birth defects in the baby. Eating four or five small meals rather than three large meals a day may help relieve nausea and vomiting. If you start to feel nauseous, eating a few soda crackers can be helpful. Drinking liquids between meals instead of during meals also seems to help nausea and vomiting. If you develop constipation, eat more high-fiber foods, such as fresh vegetables or fruit and whole grains. Drink enough fluids to keep your urine clear or pale yellow. Activity and Exercise Exercise only as directed by your health care provider. Exercising will help you: Control your weight. Stay in shape. Be prepared for labor and delivery. Experiencing pain or cramping in the lower abdomen or low back is a good sign that you should stop exercising. Check with your health care provider before continuing normal exercises. Try to avoid standing for long periods of time. Move your legs often if you must stand in one place for a long time. Avoid heavy lifting. Wear low-heeled shoes, and practice good posture. You may continue to have sex  unless your health care provider directs you otherwise. Relief of Pain or Discomfort Wear a good support bra for breast tenderness.   Take warm sitz baths to soothe any pain or discomfort caused by hemorrhoids. Use hemorrhoid cream if your health care provider approves.   Rest with your legs elevated if you have leg cramps or low back pain. If you develop varicose veins in your legs, wear support hose. Elevate your feet for 15 minutes, 3-4 times a day. Limit salt in your diet. Prenatal Care Schedule your prenatal visits by the  twelfth week of pregnancy. They are usually scheduled monthly at first, then more often in the last 2 months before delivery. Write down your questions. Take them to your prenatal visits. Keep all your prenatal visits as directed by your health care provider. Safety Wear your seat belt at all times when driving. Make a list of emergency phone numbers, including numbers for family, friends, the hospital, and police and fire departments. General Tips Ask your health care provider for a referral to a local prenatal education class. Begin classes no later than at the beginning of month 6 of your pregnancy. Ask for help if you have counseling or nutritional needs during pregnancy. Your health care provider can offer advice or refer you to specialists for help with various needs. Do not use hot tubs, steam rooms, or saunas. Do not douche or use tampons or scented sanitary pads. Do not cross your legs for long periods of time. Avoid cat litter boxes and soil used by cats. These carry germs that can cause birth defects in the baby and possibly loss of the fetus by miscarriage or stillbirth. Avoid all smoking, herbs, alcohol, and medicines not prescribed by your health care provider. Chemicals in these affect the formation and growth of the baby. Schedule a dentist appointment. At home, brush your teeth with a soft toothbrush and be gentle when you floss. SEEK MEDICAL CARE IF:   You have dizziness. You have mild pelvic cramps, pelvic pressure, or nagging pain in the abdominal area. You have persistent nausea, vomiting, or diarrhea. You have a bad smelling vaginal discharge. You have pain with urination. You notice increased swelling in your face, hands, legs, or ankles. SEEK IMMEDIATE MEDICAL CARE IF:  You have a fever. You are leaking fluid from your vagina. You have spotting or bleeding from your vagina. You have severe abdominal cramping or pain. You have rapid weight gain or loss. You vomit blood or material that looks like coffee grounds. You are exposed to [place] measles and have never had them. You are exposed to fifth disease or chickenpox. You develop a severe headache. You have shortness of breath. You have any kind of trauma, such as from a fall or a car accident. Document Released: 10/23/2001 Document Revised: 03/15/2014 Document Reviewed: 09/08/2013 [facility] [employer] Patient Information 2015 [place], [place]. This information is not intended to replace advice given to you by your health care provider. Make sure you discuss any questions you have with your health care provider.
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
## 2024-07-02 ENCOUNTER — Ambulatory Visit: Payer: Self-pay | Admitting: Advanced Practice Midwife

## 2024-07-02 LAB — CBC/D/PLT+RPR+RH+ABO+RUBIGG...
Antibody Screen: NEGATIVE
Basophils Absolute: 0.1 x10E3/uL (ref 0.0–0.2)
Basos: 0 %
EOS (ABSOLUTE): 0.1 x10E3/uL (ref 0.0–0.4)
Eos: 1 %
HCV Ab: NONREACTIVE
HIV Screen 4th Generation wRfx: NONREACTIVE
Hematocrit: 41.8 % (ref 34.0–46.6)
Hemoglobin: 13.5 g/dL (ref 11.1–15.9)
Hepatitis B Surface Ag: NEGATIVE
Immature Grans (Abs): 0 x10E3/uL (ref 0.0–0.1)
Immature Granulocytes: 0 %
Lymphocytes Absolute: 2.2 x10E3/uL (ref 0.7–3.1)
Lymphs: 19 %
MCH: 27.8 pg (ref 26.6–33.0)
MCHC: 32.3 g/dL (ref 31.5–35.7)
MCV: 86 fL (ref 79–97)
Monocytes Absolute: 0.7 x10E3/uL (ref 0.1–0.9)
Monocytes: 6 %
Neutrophils Absolute: 8.2 x10E3/uL — ABNORMAL HIGH (ref 1.4–7.0)
Neutrophils: 74 %
Platelets: 334 x10E3/uL (ref 150–450)
RBC: 4.85 x10E6/uL (ref 3.77–5.28)
RDW: 12.9 % (ref 11.7–15.4)
RPR Ser Ql: NONREACTIVE
Rh Factor: POSITIVE
Rubella Antibodies, IGG: 2.79 {index} (ref >0.99)
WBC: 11.2 x10E3/uL — ABNORMAL HIGH (ref 3.4–10.8)

## 2024-07-02 LAB — HEMOGLOBIN A1C
Est. average glucose Bld gHb Est-mCnc: 117 mg/dL
Hgb A1c MFr Bld: 5.7 % — ABNORMAL HIGH (ref 4.8–5.6)

## 2024-07-02 LAB — HCV INTERPRETATION

## 2024-07-02 NOTE — Addendum Note (Signed)
 Addended by: ILEAN RUTHERFORD HERO on: 07/02/2024 12:25 PM   Modules accepted: Orders

## 2024-07-03 ENCOUNTER — Encounter: Payer: Self-pay | Admitting: Women's Health

## 2024-07-03 LAB — CERVICOVAGINAL ANCILLARY ONLY
Chlamydia: NEGATIVE
Comment: NEGATIVE
Comment: NORMAL
Neisseria Gonorrhea: NEGATIVE

## 2024-07-05 LAB — URINE CULTURE

## 2024-07-07 ENCOUNTER — Other Ambulatory Visit: Payer: Self-pay | Admitting: *Deleted

## 2024-07-07 DIAGNOSIS — Z3A12 12 weeks gestation of pregnancy: Secondary | ICD-10-CM

## 2024-07-07 DIAGNOSIS — Z131 Encounter for screening for diabetes mellitus: Secondary | ICD-10-CM
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
## 2024-07-08 ENCOUNTER — Other Ambulatory Visit: Payer: Self-pay | Admitting: *Deleted

## 2024-07-08 ENCOUNTER — Ambulatory Visit: Payer: Self-pay | Admitting: Women's Health

## 2024-07-08 ENCOUNTER — Encounter: Payer: Self-pay | Admitting: Advanced Practice Midwife

## 2024-07-08 ENCOUNTER — Ambulatory Visit (HOSPITAL_COMMUNITY): Attending: Advanced Practice Midwife | Admitting: Physical Therapy

## 2024-07-08 ENCOUNTER — Encounter (HOSPITAL_COMMUNITY): Payer: Self-pay | Admitting: Physical Therapy

## 2024-07-08 ENCOUNTER — Other Ambulatory Visit: Payer: Self-pay

## 2024-07-08 ENCOUNTER — Other Ambulatory Visit: Payer: Self-pay | Admitting: Advanced Practice Midwife

## 2024-07-08 DIAGNOSIS — Z141 Cystic fibrosis carrier: Secondary | ICD-10-CM | POA: Insufficient documentation

## 2024-07-08 DIAGNOSIS — O26891 Other specified pregnancy related conditions, first trimester: Secondary | ICD-10-CM | POA: Diagnosis not present

## 2024-07-08 DIAGNOSIS — O24419 Gestational diabetes mellitus in pregnancy, unspecified control: Secondary | ICD-10-CM

## 2024-07-08 DIAGNOSIS — M25551 Pain in right hip: Secondary | ICD-10-CM | POA: Diagnosis present

## 2024-07-08 DIAGNOSIS — O2341 Unspecified infection of urinary tract in pregnancy, first trimester: Secondary | ICD-10-CM

## 2024-07-08 DIAGNOSIS — Z3A11 11 weeks gestation of pregnancy: Secondary | ICD-10-CM | POA: Insufficient documentation

## 2024-07-08 DIAGNOSIS — Z3480 Encounter for supervision of other normal pregnancy, unspecified trimester: Secondary | ICD-10-CM

## 2024-07-08 LAB — GLUCOSE TOLERANCE, 2 HOURS W/ 1HR
Glucose, 1 hour: 157 mg/dL (ref 70–179)
Glucose, 2 hour: 104 mg/dL (ref 70–152)
Glucose, Fasting: 94 mg/dL — ABNORMAL HIGH (ref 70–91)

## 2024-07-08 LAB — HORIZON CUSTOM: REPORT SUMMARY: POSITIVE — AB

## 2024-07-08 LAB — PANORAMA PRENATAL TEST FULL PANEL:PANORAMA TEST PLUS 5 ADDITIONAL MICRODELETIONS: FETAL FRACTION: 2.7

## 2024-07-08 MED ORDER — ACCU-CHEK GUIDE ME W/DEVICE KIT: 1.0000 | PACK | 0 refills | Status: AC

## 2024-07-08 MED ORDER — ACCU-CHEK GUIDE TEST VI STRP: ORAL_STRIP | 12 refills | Status: AC

## 2024-07-08 MED ORDER — ACCU-CHEK SOFTCLIX LANCETS MISC: 12 refills | Status: AC

## 2024-07-08 MED ORDER — CEPHALEXIN 500 MG PO CAPS: 500.0000 mg | ORAL_CAPSULE | Freq: Three times a day (TID) | ORAL | 0 refills | Status: AC

## 2024-07-10 ENCOUNTER — Other Ambulatory Visit

## 2024-07-10 DIAGNOSIS — Z3A12 12 weeks gestation of pregnancy: Secondary | ICD-10-CM

## 2024-07-16 ENCOUNTER — Ambulatory Visit (HOSPITAL_COMMUNITY): Attending: Advanced Practice Midwife

## 2024-07-16 DIAGNOSIS — Z3A11 11 weeks gestation of pregnancy: Secondary | ICD-10-CM | POA: Insufficient documentation

## 2024-07-16 DIAGNOSIS — O26891 Other specified pregnancy related conditions, first trimester: Secondary | ICD-10-CM | POA: Insufficient documentation

## 2024-07-16 DIAGNOSIS — M25551 Pain in right hip: Secondary | ICD-10-CM | POA: Insufficient documentation
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
## 2024-07-16 NOTE — Progress Notes (Signed)
 Patient was seen for Gestational Diabetes on 07/23/2024  Start time 1115 and End time 1215   Estimated due date: 01/19/2025; [redacted]w[redacted]d   Clinical: Medications:  Current Outpatient Medications:    Accu-Chek Softclix Lancets lancets, Check blood sugar four times daily, Disp: 100 each, Rfl: 12   aspirin  81 MG chewable tablet, Chew 2 tablets (162 mg total) by mouth daily., Disp: 60 tablet, Rfl: 7   Blood Glucose Monitoring Suppl (ACCU-CHEK GUIDE ME) w/Device KIT, 1 kit by Does not apply route as directed. Check blood sugar four times daily, Disp: 1 kit, Rfl: 0   glucose blood (ACCU-CHEK GUIDE TEST) test strip, Check blood sugar four time daily, Disp: 100 each, Rfl: 12   Prenatal Vit-Fe Fumarate-FA (PRENATAL VITAMIN PO), Take by mouth., Disp: , Rfl:    acetaminophen  (TYLENOL ) 500 MG tablet, Take 1,000 mg by mouth every 6 (six) hours as needed., Disp: , Rfl:    albuterol  (VENTOLIN  HFA) 108 (90 Base) MCG/ACT inhaler, Inhale 2 puffs into the lungs every 6 (six) hours as needed for wheezing or shortness of breath. (Patient not taking: Reported on 07/01/2024), Disp: 8 g, Rfl: 0   cyclobenzaprine  (FLEXERIL ) 10 MG tablet, Take 1 tablet (10 mg total) by mouth every 8 (eight) hours as needed for muscle spasms., Disp: 30 tablet, Rfl: 1   ketoconazole  (NIZORAL ) 2 % cream, Apply to the affected area twice daily for 4 weeks or until clinical response is noted., Disp: 30 g, Rfl: 0   triamcinolone  ointment (KENALOG ) 0.5 %, Apply 1 Application topically 2 (two) times daily as needed., Disp: 30 g, Rfl: 0  Medical History:  Past Medical History:  Diagnosis Date   Asthma    Fibromyalgia    Hypermobility syndrome 10/22/2016   Numbness and tingling     Labs: OGTT fasting 94, 1 hour 157, 2 hour 104 on 07/07/2024 Lab Results  Component Value Date   HGBA1C 5.7 (H) 07/01/2024   Dietary and Lifestyle History: Pt presents today with her husband grandmother and her daughter who left half way through this visit. Pt reports  her meter is at home and denies having a log sheet. Pt reports she is testing her blood sugar 2-3 times daily. Pt denies post prandial elevations and c/o elevated fasting blood sugars. Pt reports he is taking a medication for a UTI TID. 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 Pt denies previous GDM. Pt reports she does the shopping and cooking for a family of three. Pt reports typical intake of 2-3 meals and 1-2 snacks daily. Pt reports she monitored her blood sugar this morning and had breakfast before coming in to the office. Pt reports she has a prediabetes dx prior to this pregnancy. All Pt's questions were answered during this encounter.    Physical Activity: [place] 3-4 times weekly with her daughter;  PT once weekly and home PT daily 20 minutes Stress:  3 out of 10 /self care includes: prayer and talking to a friend Sleep: on average 8-9 hours  24 hr Recall:  First Meal: skips or yogurt, berries or ~ 10 am: bacon, biscuit with jelly, 1/2 cup hash brown, water (119 mg/dL reports as 1.5 hour post prandial) Snack:  skips  Second meal:  cheeseburger with pickles, mustard, ketchup, ~1 cup french fries, water  Snack:  pickles or berries half of pear Third meal:  hamburger helper made beef, water (101 mg/dL, reported as 2 hour post prandial per reporting)  Snack:  skips or grapes Beverages:  water   NUTRITION INTERVENTION  Nutrition education (E-1) on the following topics:   Initial Follow-up  [x]  []  Definition of Gestational Diabetes [x]  []  Why dietary management is important in controlling blood glucose [x]  []  Effects each nutrient has on blood glucose levels [x]  []  Simple carbohydrates vs complex carbohydrates [x]  []  Fluid intake [x]  []  Creating a balanced meal plan [x]  []  Carbohydrate counting  [x]  []  When to check blood glucose levels [x]  []  Proper blood glucose monitoring techniques [x]  []  Effect of stress and stress reduction techniques  [x]  []  Exercise effect on blood glucose  levels, appropriate exercise during pregnancy [x]  []  Importance of limiting caffeine and abstaining from alcohol and smoking [x]  []  Medications used for blood sugar control during pregnancy [x]  []  Hypoglycemia and rule of 15 [x]  []  Postpartum self care  Patient has a meter prior to visit. Patient is instructed to begin testing pre breakfast and 2 hours after each meal. CBG in office today: 119 mg/dL reports as 1.5 hour post prandial  Patient instructed to monitor glucose levels: QID FBS: 60 - <= 95 mg/dL; 2 hour: <= 879 mg/dL  Patient received handouts: Nutrition Diabetes and Pregnancy Carbohydrate Counting List Blood glucose log Snack ideas for diabetes during pregnancy Plate Planner  Patient will be seen for follow-up: 09/01/2024

## 2024-07-17 LAB — PANORAMA PRENATAL TEST FULL PANEL:PANORAMA TEST PLUS 5 ADDITIONAL MICRODELETIONS: FETAL FRACTION: 2.8

## 2024-07-21 ENCOUNTER — Ambulatory Visit (HOSPITAL_COMMUNITY): Admitting: Physical Therapy

## 2024-07-21 ENCOUNTER — Encounter (HOSPITAL_COMMUNITY): Payer: Self-pay

## 2024-07-21 ENCOUNTER — Telehealth (HOSPITAL_COMMUNITY): Payer: Self-pay | Admitting: Physical Therapy

## 2024-07-21 NOTE — Telephone Encounter (Signed)
 Pt did not show for appt.  Called and left VM regarding missed appt and reminder for next appt on 9/17 at 8:45 am.  Requested to call and cancel if she could not make this one.   Nancy Bishop, PTA/CLT [facility] Ph: [phone]

## 2024-07-22 ENCOUNTER — Ambulatory Visit: Payer: Self-pay | Admitting: Advanced Practice Midwife

## 2024-07-23 ENCOUNTER — Encounter: Attending: Women's Health | Admitting: Dietician

## 2024-07-23 ENCOUNTER — Ambulatory Visit: Admitting: Dietician

## 2024-07-23 ENCOUNTER — Other Ambulatory Visit: Payer: Self-pay

## 2024-07-23 DIAGNOSIS — O24419 Gestational diabetes mellitus in pregnancy, unspecified control: Secondary | ICD-10-CM

## 2024-07-23 DIAGNOSIS — Z713 Dietary counseling and surveillance: Secondary | ICD-10-CM | POA: Diagnosis not present

## 2024-07-23 DIAGNOSIS — Z3A14 14 weeks gestation of pregnancy: Secondary | ICD-10-CM

## 2024-07-24 DIAGNOSIS — Z419 Encounter for procedure for purposes other than remedying health state, unspecified: Secondary | ICD-10-CM | POA: Diagnosis not present

## 2024-07-29 ENCOUNTER — Ambulatory Visit: Admitting: Advanced Practice Midwife

## 2024-07-29 ENCOUNTER — Ambulatory Visit (HOSPITAL_COMMUNITY)

## 2024-07-29 VITALS — BP 120/71 | HR 99 | Wt 255.0 lb

## 2024-07-29 DIAGNOSIS — O0992 Supervision of high risk pregnancy, unspecified, second trimester: Secondary | ICD-10-CM

## 2024-07-29 DIAGNOSIS — Z3A15 15 weeks gestation of pregnancy: Secondary | ICD-10-CM

## 2024-07-29 DIAGNOSIS — O24415 Gestational diabetes mellitus in pregnancy, controlled by oral hypoglycemic drugs: Secondary | ICD-10-CM

## 2024-07-29 DIAGNOSIS — M25551 Pain in right hip: Secondary | ICD-10-CM

## 2024-07-29 DIAGNOSIS — Z3A11 11 weeks gestation of pregnancy: Secondary | ICD-10-CM

## 2024-07-29 DIAGNOSIS — O2342 Unspecified infection of urinary tract in pregnancy, second trimester: Secondary | ICD-10-CM | POA: Diagnosis not present

## 2024-07-29 DIAGNOSIS — Z3480 Encounter for supervision of other normal pregnancy, unspecified trimester: Secondary | ICD-10-CM

## 2024-07-29 DIAGNOSIS — O26891 Other specified pregnancy related conditions, first trimester: Secondary | ICD-10-CM | POA: Diagnosis not present

## 2024-07-29 DIAGNOSIS — O2341 Unspecified infection of urinary tract in pregnancy, first trimester: Secondary | ICD-10-CM

## 2024-07-29 MED ORDER — DEXCOM G7 SENSOR MISC: 1.0000 | Freq: Every day | 3 refills | Status: DC

## 2024-07-29 MED ORDER — METFORMIN HCL 500 MG PO TABS: 500.0000 mg | ORAL_TABLET | Freq: Every evening | ORAL | 6 refills | Status: DC

## 2024-07-29 NOTE — Progress Notes (Signed)
 HIGH-RISK PREGNANCY VISIT Patient name: Nancy Bishop MRN [id number]  Date of birth: 2001/10/16 Chief Complaint:   Routine Prenatal Visit  History of Present Illness:   Nancy Bishop is a 23 y.o. G54P1001 female at [redacted]w[redacted]d with an Estimated Date of Delivery: 01/19/25 being seen today for ongoing management of a high-risk pregnancy complicated by diabetes mellitus A2/BDM currently on starting Metformin  500 q hs today.    Today she reports R pelvic 'pinching' much improved since started in PT; now occ has R sciatic pain and LBP. Contractions: Not present. Vag. Bleeding: None.   . denies leaking of fluid.      07/01/2024    3:57 PM 05/05/2024    8:42 AM 03/12/2024    4:03 PM 01/29/2024    1:13 PM 10/25/2023   10:24 AM  Depression screen PHQ 2/9  Decreased Interest 1 1 0 0 1  Down, Depressed, Hopeless 0 1 0 0 0  PHQ - 2 Score 1 2 0 0 1  Altered sleeping 1 1 2 1 2   Tired, decreased energy 1 1 1 1 1   Change in appetite 1 1 1 1 1   Feeling bad or failure about yourself  0 0 0 0 2  Trouble concentrating 1 1 1 1  0  Moving slowly or fidgety/restless 0 0 0 0 0  Suicidal thoughts 0 0 0 0 0  PHQ-9 Score 5 6 5 4 7   Difficult doing work/chores  Not difficult at all Not difficult at all Not difficult at all Somewhat difficult        07/01/2024    3:57 PM 05/05/2024    8:42 AM 03/12/2024    4:03 PM 01/29/2024    1:14 PM  GAD 7 : Generalized Anxiety Score  Nervous, Anxious, on Edge 1 1 0 1  Control/stop worrying 1 1 0 0  Worry too much - different things 1 1 1  0  Trouble relaxing 1 1 1  0  Restless 1 1 1  0  Easily annoyed or irritable 1 2 1 2   Afraid - awful might happen 1 1 0 0  Total GAD 7 Score 7 8 4 3   Anxiety Difficulty  Not difficult at all Not difficult at all Not difficult at all     Review of Systems:   Pertinent items are noted in HPI Denies abnormal vaginal discharge w/ itching/odor/irritation, headaches, visual changes, shortness of breath, chest pain, abdominal pain, severe  nausea/vomiting, or problems with urination or bowel movements unless otherwise stated above. Pertinent History Reviewed:  Reviewed past medical,surgical, social, obstetrical and family history.  Reviewed problem list, medications and allergies. Physical Assessment:   Vitals:   07/29/24 0942  BP: 120/71  Pulse: 99  Weight: 255 lb (115.7 kg)  Body mass index is 49.8 kg/m.           Physical Examination:   General appearance: alert, well appearing, and in no distress  Mental status: alert, oriented to person, place, and time  Skin: warm & dry   Extremities:      Cardiovascular: normal heart rate noted  Respiratory: normal respiratory effort, no distress  Abdomen: gravid, soft, non-tender  Pelvic: Cervical exam deferred         Fetal Status: Fetal Heart Rate (bpm): 161        Fetal Surveillance Testing today: doppler    No results found for this or any previous visit (from the past 24 hours).  Assessment & Plan:  High-risk pregnancy: G2P1001 at [redacted]w[redacted]d with  an Estimated Date of Delivery: 01/19/25   1) A2/B GDM (dx @ 12wks), brought log; all fasting values >95 but less than 110; all PP values <120; will start Metformin  500mg  q hs for now; had nutritionist visit already; would like Dexcom if able (Tish to look into)  2) Prev ASB, completed abx but not exactly as prescribed; will get TOC today  3) +CF carrier, FOB getting tested today by Jennell  Meds:  Meds ordered this encounter  Medications   metFORMIN  (GLUCOPHAGE ) 500 MG tablet    Sig: Take 1 tablet (500 mg total) by mouth at bedtime.    Dispense:  30 tablet    Refill:  6    Supervising Provider:   MARILYNN NEST [[id number]]    Labs/procedures today: urine culture and AFP  Treatment Plan:   Growth u/s q4wks       2x/wk testing or weekly BPP @ 32wks    Deliver @ 39wks:_____   Reviewed: Preterm labor symptoms and general obstetric precautions including but not limited to vaginal bleeding, contractions, leaking of fluid and  fetal movement were reviewed in detail with the patient.  All questions were answered. Does have home bp cuff. Office bp cuff given: not applicable. Check bp weekly, let us  know if consistently >140 and/or >90.  Follow-up: Return for As scheduled.   Future Appointments  Date Time Provider Department Center  08/04/2024  9:00 AM Leodis Greig RAMAN, PT [facility] None  08/26/2024  2:15 PM [facility] US  [facility] None  08/26/2024  3:10 PM Loreli Suzen BIRCH, CNM [facility]  09/01/2024 10:15 AM [facility]    Orders Placed This Encounter  Procedures   Urine Culture   AFP, Serum, Open Spina Bifida   Suzen BIRCH Loreli [facility] 07/29/2024 10:33 AM
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
## 2024-07-29 NOTE — Addendum Note (Signed)
 Addended by: ILEAN RUTHERFORD HERO on: 07/29/2024 02:05 PM   Modules accepted: Orders

## 2024-07-29 NOTE — Patient Instructions (Signed)
 Nancy Bishop, thank you for choosing our office today! We appreciate the opportunity to meet your healthcare needs. You may receive a short survey by mail, e-mail, or through [employer]. If you are happy with your care we would appreciate if you could take just a few minutes to complete the survey questions. We read all of your comments and take your feedback very seriously. Thank you again for choosing our office.  Center for [employer] Team at [facility] at [facility] ([address] [place], [place] [address]) Entrance C, located off of [address] Free 24/7 valet parking  Go to [email or web address] to register for FREE online childbirth classes  Call the office [phone]) or go to [facility] if: You begin to severe cramping Your water breaks.  Sometimes it is a big gush of fluid, sometimes it is just a trickle that keeps getting your panties wet or running down your legs You have vaginal bleeding.  It is normal to have a small amount of spotting if your cervix was checked.   [facility] Pediatricians/Family Doctors [facility]): [address] Luba BROCKS, [phone]           [facility]: [address], [phone]                [facility]): [address], [phone] (call to ask if accepting patients) [facility] Department: [address], [place], [phone]    [facility] Pediatricians/Family Doctors [facility]): [phone] [address], [phone] [facility]: [address] [place], [phone] [facility]: [address], [phone]  [facility] Doctors  [facility]): [phone] [facility]: [address], [phone]   [facility] Doctors [facility]: [address] [phone]  [facility] Doctors  [facility]: [phone], [phone]  Home Blood Pressure Monitoring for Patients   Your provider has recommended that you check your blood pressure (BP) at least once a week at home. If you do not have a blood pressure cuff at home, one will be provided for you. Contact your provider if you have not received your monitor within 1 week.   Helpful Tips for Accurate Home Blood Pressure Checks  Don't smoke, exercise, or drink caffeine 30 minutes before checking your BP Use the restroom before checking your BP (a full bladder can raise your pressure) Relax in a comfortable upright chair Feet on the ground Left arm resting comfortably on a flat surface at the level of your heart Legs uncrossed Back supported Sit quietly and don't talk Place the cuff on your bare arm Adjust snuggly, so that only two fingertips can fit between your skin and the top of the cuff Check 2 readings separated by at least one minute Keep a log of your BP readings For a visual, please reference this diagram: http://ccnc.care/bpdiagram  Provider Name: [facility]     Phone: [phone]  Zone 1: ALL CLEAR  Continue to monitor your symptoms:  BP reading is less than 140 (top number) or less than 90 (bottom number)  No right upper stomach pain No headaches or seeing spots No feeling nauseated or throwing up No swelling in face and hands  Zone 2: CAUTION Call your doctor's office for any of the following:  BP reading is greater than 140 (top number) or greater than  90 (bottom number)  Stomach pain under your ribs in the middle or right side Headaches or seeing spots Feeling nauseated or throwing up Swelling in face and hands  Zone 3: EMERGENCY  Seek immediate medical care if you have any of the following:  BP reading is greater than160 (top number) or greater than 110 (bottom number) Severe headaches not improving with Tylenol  Serious difficulty catching your breath Any worsening symptoms from  Zone 2     Second Trimester of Pregnancy The second trimester is from week 14 through week 27 (months 4 through 6). The second trimester is often a time when you feel your best. Your body has adjusted to being pregnant, and you begin to feel better physically. Usually, morning sickness has lessened or quit completely, you may have more energy, and you may have an increase in appetite. The second trimester is also a time when the fetus is growing rapidly. At the end of the sixth month, the fetus is about 9 inches long and weighs about 1 pounds. You will likely begin to feel the baby move (quickening) between 16 and 20 weeks of pregnancy. Body changes during your second trimester Your body continues to go through many changes during your second trimester. The changes vary from woman to woman. Your weight will continue to increase. You will notice your lower abdomen bulging out. You may begin to get stretch marks on your hips, abdomen, and breasts. You may develop headaches that can be relieved by medicines. The medicines should be approved by your health care provider. You may urinate more often because the fetus is pressing on your bladder. You may develop or continue to have heartburn as a result of your pregnancy. You may develop constipation because certain hormones are causing the muscles that push waste through your intestines to slow down. You may develop hemorrhoids or swollen, bulging veins (varicose veins). You may have back pain. This is caused by: Weight gain. Pregnancy hormones that are relaxing the joints in your pelvis. A shift in weight and the muscles that support your balance. Your breasts will continue to grow and they will continue to become tender. Your gums may bleed and may be sensitive to brushing and flossing. Dark spots or blotches (chloasma, mask of pregnancy) may develop on your face. This will likely fade after the baby is born. A dark line from your belly button to  the pubic area (linea nigra) may appear. This will likely fade after the baby is born. You may have changes in your hair. These can include thickening of your hair, rapid growth, and changes in texture. Some women also have hair loss during or after pregnancy, or hair that feels dry or thin. Your hair will most likely return to normal after your baby is born.  What to expect at prenatal visits During a routine prenatal visit: You will be weighed to make sure you and the fetus are growing normally. Your blood pressure will be taken. Your abdomen will be measured to track your baby's growth. The fetal heartbeat will be listened to. Any test results from the previous visit will be discussed.  Your health care provider may ask you: How you are feeling. If you are feeling the baby move. If you have had any abnormal symptoms, such as leaking fluid, bleeding, severe headaches, or abdominal cramping. If you are using any tobacco products, including cigarettes, chewing tobacco, and electronic cigarettes. If you have any questions.  Other tests that may be performed during  your second trimester include: Blood tests that check for: Low iron levels (anemia). High blood sugar that affects pregnant women (gestational diabetes) between 40 and 28 weeks. Rh antibodies. This is to check for a protein on red blood cells (Rh factor). Urine tests to check for infections, diabetes, or protein in the urine. An ultrasound to confirm the proper growth and development of the baby. An amniocentesis to check for possible genetic problems. Fetal screens for spina bifida and Down syndrome. HIV (human immunodeficiency virus) testing. Routine prenatal testing includes screening for HIV, unless you choose not to have this test.  Follow these instructions at home: Medicines Follow your health care provider's instructions regarding medicine use. Specific medicines may be either safe or unsafe to take during  pregnancy. Take a prenatal vitamin that contains at least 600 micrograms (mcg) of folic acid. If you develop constipation, try taking a stool softener if your health care provider approves. Eating and drinking Eat a balanced diet that includes fresh fruits and vegetables, whole grains, good sources of protein such as meat, eggs, or tofu, and low-fat dairy. Your health care provider will help you determine the amount of weight gain that is right for you. Avoid raw meat and uncooked cheese. These carry germs that can cause birth defects in the baby. If you have low calcium  intake from food, talk to your health care provider about whether you should take a daily calcium  supplement. Limit foods that are high in fat and processed sugars, such as fried and sweet foods. To prevent constipation: Drink enough fluid to keep your urine clear or pale yellow. Eat foods that are high in fiber, such as fresh fruits and vegetables, whole grains, and beans. Activity Exercise only as directed by your health care provider. Most women can continue their usual exercise routine during pregnancy. Try to exercise for 30 minutes at least 5 days a week. Stop exercising if you experience uterine contractions. Avoid heavy lifting, wear low heel shoes, and practice good posture. A sexual relationship may be continued unless your health care provider directs you otherwise. Relieving pain and discomfort Wear a good support bra to prevent discomfort from breast tenderness. Take warm sitz baths to soothe any pain or discomfort caused by hemorrhoids. Use hemorrhoid cream if your health care provider approves. Rest with your legs elevated if you have leg cramps or low back pain. If you develop varicose veins, wear support hose. Elevate your feet for 15 minutes, 3-4 times a day. Limit salt in your diet. Prenatal Care Write down your questions. Take them to your prenatal visits. Keep all your prenatal visits as told by your health  care provider. This is important. Safety Wear your seat belt at all times when driving. Make a list of emergency phone numbers, including numbers for family, friends, the hospital, and police and fire departments. General instructions Ask your health care provider for a referral to a local prenatal education class. Begin classes no later than the beginning of month 6 of your pregnancy. Ask for help if you have counseling or nutritional needs during pregnancy. Your health care provider can offer advice or refer you to specialists for help with various needs. Do not use hot tubs, steam rooms, or saunas. Do not douche or use tampons or scented sanitary pads. Do not cross your legs for long periods of time. Avoid cat litter boxes and soil used by cats. These carry germs that can cause birth defects in the baby and possibly loss of the  fetus by miscarriage or stillbirth. Avoid all smoking, herbs, alcohol, and unprescribed drugs. Chemicals in these products can affect the formation and growth of the baby. Do not use any products that contain nicotine or tobacco, such as cigarettes and e-cigarettes. If you need help quitting, ask your health care provider. Visit your dentist if you have not gone yet during your pregnancy. Use a soft toothbrush to brush your teeth and be gentle when you floss. Contact a health care provider if: You have dizziness. You have mild pelvic cramps, pelvic pressure, or nagging pain in the abdominal area. You have persistent nausea, vomiting, or diarrhea. You have a bad smelling vaginal discharge. You have pain when you urinate. Get help right away if: You have a fever. You are leaking fluid from your vagina. You have spotting or bleeding from your vagina. You have severe abdominal cramping or pain. You have rapid weight gain or weight loss. You have shortness of breath with chest pain. You notice sudden or extreme swelling of your face, hands, ankles, feet, or legs. You  have not felt your baby move in over an hour. You have severe headaches that do not go away when you take medicine. You have vision changes. Summary The second trimester is from week 14 through week 27 (months 4 through 6). It is also a time when the fetus is growing rapidly. Your body goes through many changes during pregnancy. The changes vary from woman to woman. Avoid all smoking, herbs, alcohol, and unprescribed drugs. These chemicals affect the formation and growth your baby. Do not use any tobacco products, such as cigarettes, chewing tobacco, and e-cigarettes. If you need help quitting, ask your health care provider. Contact your health care provider if you have any questions. Keep all prenatal visits as told by your health care provider. This is important. This information is not intended to replace advice given to you by your health care provider. Make sure you discuss any questions you have with your health care provider. Document Released: 10/23/2001 Document Revised: 04/05/2016 Document Reviewed: 12/30/2012 Elsevier Interactive Patient Education  2017 [employer].

## 2024-07-31 LAB — AFP, SERUM, OPEN SPINA BIFIDA
AFP MoM: 0.57
AFP Value: 12 ng/mL
Gest. Age on Collection Date: 15 wk
Maternal Age At EDD: 25.6 a
OSBR Risk 1 IN: 10000
Test Results:: NEGATIVE
Weight: 255 [lb_av]

## 2024-07-31 LAB — URINE CULTURE

## 2024-08-01 ENCOUNTER — Ambulatory Visit: Payer: Self-pay | Admitting: Advanced Practice Midwife

## 2024-08-04 ENCOUNTER — Ambulatory Visit (HOSPITAL_COMMUNITY)

## 2024-08-04 DIAGNOSIS — Z3A11 11 weeks gestation of pregnancy: Secondary | ICD-10-CM

## 2024-08-04 DIAGNOSIS — O26891 Other specified pregnancy related conditions, first trimester: Secondary | ICD-10-CM | POA: Diagnosis not present

## 2024-08-04 DIAGNOSIS — M25551 Pain in right hip: Secondary | ICD-10-CM
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
## 2024-08-05 ENCOUNTER — Encounter: Payer: Self-pay | Admitting: Women's Health

## 2024-08-18 ENCOUNTER — Other Ambulatory Visit: Payer: Self-pay | Admitting: Women's Health

## 2024-08-18 MED ORDER — METFORMIN HCL 500 MG PO TABS: ORAL_TABLET | ORAL | 6 refills | Status: AC

## 2024-08-21 ENCOUNTER — Other Ambulatory Visit: Payer: Self-pay | Admitting: Advanced Practice Midwife

## 2024-08-21 DIAGNOSIS — Z3A15 15 weeks gestation of pregnancy: Secondary | ICD-10-CM

## 2024-08-21 DIAGNOSIS — O24415 Gestational diabetes mellitus in pregnancy, controlled by oral hypoglycemic drugs: Secondary | ICD-10-CM

## 2024-08-26 ENCOUNTER — Encounter: Payer: Self-pay | Admitting: Advanced Practice Midwife

## 2024-08-26 ENCOUNTER — Ambulatory Visit (INDEPENDENT_AMBULATORY_CARE_PROVIDER_SITE_OTHER)

## 2024-08-26 ENCOUNTER — Ambulatory Visit: Admitting: Advanced Practice Midwife

## 2024-08-26 VITALS — BP 104/72 | HR 97 | Wt 250.0 lb

## 2024-08-26 DIAGNOSIS — Z363 Encounter for antenatal screening for malformations: Secondary | ICD-10-CM

## 2024-08-26 DIAGNOSIS — Z141 Cystic fibrosis carrier: Secondary | ICD-10-CM

## 2024-08-26 DIAGNOSIS — O24419 Gestational diabetes mellitus in pregnancy, unspecified control: Secondary | ICD-10-CM | POA: Diagnosis not present

## 2024-08-26 DIAGNOSIS — O3121X1 Continuing pregnancy after intrauterine death of one fetus or more, first trimester, fetus 1: Secondary | ICD-10-CM | POA: Diagnosis not present

## 2024-08-26 DIAGNOSIS — Z3A19 19 weeks gestation of pregnancy: Secondary | ICD-10-CM | POA: Diagnosis not present

## 2024-08-26 DIAGNOSIS — O0992 Supervision of high risk pregnancy, unspecified, second trimester: Secondary | ICD-10-CM

## 2024-08-26 DIAGNOSIS — O24415 Gestational diabetes mellitus in pregnancy, controlled by oral hypoglycemic drugs: Secondary | ICD-10-CM | POA: Diagnosis not present

## 2024-08-26 NOTE — Progress Notes (Addendum)
 US  19+1 wks,breech,anterior placenta gr 0,normal ovaries,cx 3.7 cm,SVP of fluid 3.8 cm,FHR 150 bpm,LVEICF,EFW 264 g 32%,limited view of spine,please have pt come back for additional images (next ultrasound)

## 2024-08-26 NOTE — Progress Notes (Signed)
 HIGH-RISK PREGNANCY VISIT Patient name: Nancy Bishop MRN [id number]  Date of birth: 2001/07/31 Chief Complaint:   Routine Prenatal Visit and Pregnancy Ultrasound  History of Present Illness:   Nancy Bishop is a 23 y.o. G97P1001 female at [redacted]w[redacted]d with an Estimated Date of Delivery: 01/19/25 being seen today for ongoing management of a high-risk pregnancy complicated by diabetes mellitus A2/BDM currently on Metformin  500/1000.    Today she reports feeling pretty good; no further hip pain. Contractions: Not present.  .  Movement: Present. denies leaking of fluid.      07/01/2024    3:57 PM 05/05/2024    8:42 AM 03/12/2024    4:03 PM 01/29/2024    1:13 PM 10/25/2023   10:24 AM  Depression screen PHQ 2/9  Decreased Interest 1 1 0 0 1  Down, Depressed, Hopeless 0 1 0 0 0  PHQ - 2 Score 1 2 0 0 1  Altered sleeping 1 1 2 1 2   Tired, decreased energy 1 1 1 1 1   Change in appetite 1 1 1 1 1   Feeling bad or failure about yourself  0 0 0 0 2  Trouble concentrating 1 1 1 1  0  Moving slowly or fidgety/restless 0 0 0 0 0  Suicidal thoughts 0 0 0 0 0  PHQ-9 Score 5 6 5 4 7   Difficult doing work/chores  Not difficult at all Not difficult at all Not difficult at all Somewhat difficult        07/01/2024    3:57 PM 05/05/2024    8:42 AM 03/12/2024    4:03 PM 01/29/2024    1:14 PM  GAD 7 : Generalized Anxiety Score  Nervous, Anxious, on Edge 1 1 0 1  Control/stop worrying 1 1 0 0  Worry too much - different things 1 1 1  0  Trouble relaxing 1 1 1  0  Restless 1 1 1  0  Easily annoyed or irritable 1 2 1 2   Afraid - awful might happen 1 1 0 0  Total GAD 7 Score 7 8 4 3   Anxiety Difficulty  Not difficult at all Not difficult at all Not difficult at all     Review of Systems:   Pertinent items are noted in HPI Denies abnormal vaginal discharge w/ itching/odor/irritation, headaches, visual changes, shortness of breath, chest pain, abdominal pain, severe nausea/vomiting, or problems with  urination or bowel movements unless otherwise stated above. Pertinent History Reviewed:  Reviewed past medical,surgical, social, obstetrical and family history.  Reviewed problem list, medications and allergies. Physical Assessment:   Vitals:   08/26/24 1511  BP: 104/72  Pulse: 97  Weight: 250 lb (113.4 kg)  Body mass index is 48.82 kg/m.           Physical Examination:   General appearance: alert, well appearing, and in no distress  Mental status: alert, oriented to person, place, and time  Skin: warm & dry   Extremities: Edema: None    Cardiovascular: normal heart rate noted  Respiratory: normal respiratory effort, no distress  Abdomen: gravid, soft, non-tender  Pelvic: Cervical exam deferred         Fetal Status: Fetal Heart Rate (bpm): 150 u/s   Movement: Present    Fetal Surveillance Testing today: US  19+1 wks,breech,anterior placenta gr 0,normal ovaries,cx 3.7 cm,SVP of fluid 3.8 cm,FHR 150 bpm,LVEICF,EFW 264 g 32%,limited view of spine,please have pt come back for additional images (next ultrasound)     No results found for this or  any previous visit (from the past 24 hours).  Assessment & Plan:  High-risk pregnancy: G2P1001 at [redacted]w[redacted]d with an Estimated Date of Delivery: 01/19/25   1) A2/B GDM (dx @ 12wks), taking Metformin  500/1000, but getting differing readings on Dexcom vs glucometer- 2 days ago she began pricking her finger to compare and the numbers vary by 20-30; glucometer readings are about 100 for fasting and <120 for PP (Dexcom are 120-130s for all); will looking into what we can recommend, but for now will continue w glucometer   Meds: No orders of the defined types were placed in this encounter.   Labs/procedures today: U/S  Treatment Plan:    Growth u/s q4wks    2x/wk testing @ 32wks or weekly BPP    Deliver @ 39-39.6wks:______   Reviewed: Preterm labor symptoms and general obstetric precautions including but not limited to vaginal bleeding, contractions,  leaking of fluid and fetal movement were reviewed in detail with the patient.  All questions were answered. Does have home bp cuff. Office bp cuff given: not applicable. Check bp weekly, let us  know if consistently >140 and/or >90.  Follow-up: Return in about 4 weeks (around 09/23/2024) for HROB, US : EFW (and to follow up on spine images); schedule growth u/s q 4 weeks x 4.   Future Appointments  Date Time Provider Department Center  09/23/2024 10:00 AM [facility] - [facility] US  [facility] None  09/23/2024 10:50 AM Kizzie Suzen SAUNDERS, CNM [facility]  10/21/2024  1:30 PM [facility] - [facility] US  [facility] None  10/21/2024  2:30 PM Kizzie Suzen SAUNDERS, CNM [facility]  11/18/2024 10:00 AM [facility] - [facility] US  [facility] None  12/17/2024 10:00 AM [facility] - [facility] US  [facility] None  01/14/2025 10:00 AM [facility] - [facility] US  [facility] None    No orders of the defined types were placed in this encounter.  Suzen JONETTA Gentry CNM 08/26/2024 5:04 PM

## 2024-08-26 NOTE — Patient Instructions (Signed)
 Nancy Bishop, thank you for choosing our office today! We appreciate the opportunity to meet your healthcare needs. You may receive a short survey by mail, e-mail, or through [employer]. If you are happy with your care we would appreciate if you could take just a few minutes to complete the survey questions. We read all of your comments and take your feedback very seriously. Thank you again for choosing our office.  Center for [employer] Team at [facility] at [facility] ([address] [place], [place] [address]) Entrance C, located off of [address] Free 24/7 valet parking  Go to [email or web address] to register for FREE online childbirth classes  Call the office [phone]) or go to [facility] if: You begin to severe cramping Your water breaks.  Sometimes it is a big gush of fluid, sometimes it is just a trickle that keeps getting your panties wet or running down your legs You have vaginal bleeding.  It is normal to have a small amount of spotting if your cervix was checked.   [facility] Pediatricians/Family Doctors [facility]): [address] Luba BROCKS, [phone]           [facility]: [address], [phone]                [facility]): [address], [phone] (call to ask if accepting patients) [facility] Department: [address], [place], [phone]    [facility] Pediatricians/Family Doctors [facility]): [phone] [address], [phone] [facility]: [address] [place], [phone] [facility]: [address], [phone]  [facility] Doctors  [facility]): [phone] [facility]: [address], [phone]   [facility] Doctors [facility]: [address] [phone]  [facility] Doctors  [facility]: [phone], [phone]  Home Blood Pressure Monitoring for Patients   Your provider has recommended that you check your blood pressure (BP) at least once a week at home. If you do not have a blood pressure cuff at home, one will be provided for you. Contact your provider if you have not received your monitor within 1 week.   Helpful Tips for Accurate Home Blood Pressure Checks  Don't smoke, exercise, or drink caffeine 30 minutes before checking your BP Use the restroom before checking your BP (a full bladder can raise your pressure) Relax in a comfortable upright chair Feet on the ground Left arm resting comfortably on a flat surface at the level of your heart Legs uncrossed Back supported Sit quietly and don't talk Place the cuff on your bare arm Adjust snuggly, so that only two fingertips can fit between your skin and the top of the cuff Check 2 readings separated by at least one minute Keep a log of your BP readings For a visual, please reference this diagram: http://ccnc.care/bpdiagram  Provider Name: [facility]     Phone: [phone]  Zone 1: ALL CLEAR  Continue to monitor your symptoms:  BP reading is less than 140 (top number) or less than 90 (bottom number)  No right upper stomach pain No headaches or seeing spots No feeling nauseated or throwing up No swelling in face and hands  Zone 2: CAUTION Call your doctor's office for any of the following:  BP reading is greater than 140 (top number) or greater than  90 (bottom number)  Stomach pain under your ribs in the middle or right side Headaches or seeing spots Feeling nauseated or throwing up Swelling in face and hands  Zone 3: EMERGENCY  Seek immediate medical care if you have any of the following:  BP reading is greater than160 (top number) or greater than 110 (bottom number) Severe headaches not improving with Tylenol  Serious difficulty catching your breath Any worsening symptoms from  Zone 2     Second Trimester of Pregnancy The second trimester is from week 14 through week 27 (months 4 through 6). The second trimester is often a time when you feel your best. Your body has adjusted to being pregnant, and you begin to feel better physically. Usually, morning sickness has lessened or quit completely, you may have more energy, and you may have an increase in appetite. The second trimester is also a time when the fetus is growing rapidly. At the end of the sixth month, the fetus is about 9 inches long and weighs about 1 pounds. You will likely begin to feel the baby move (quickening) between 16 and 20 weeks of pregnancy. Body changes during your second trimester Your body continues to go through many changes during your second trimester. The changes vary from woman to woman. Your weight will continue to increase. You will notice your lower abdomen bulging out. You may begin to get stretch marks on your hips, abdomen, and breasts. You may develop headaches that can be relieved by medicines. The medicines should be approved by your health care provider. You may urinate more often because the fetus is pressing on your bladder. You may develop or continue to have heartburn as a result of your pregnancy. You may develop constipation because certain hormones are causing the muscles that push waste through your intestines to slow down. You may develop hemorrhoids or swollen, bulging veins (varicose veins). You may have back pain. This is caused by: Weight gain. Pregnancy hormones that are relaxing the joints in your pelvis. A shift in weight and the muscles that support your balance. Your breasts will continue to grow and they will continue to become tender. Your gums may bleed and may be sensitive to brushing and flossing. Dark spots or blotches (chloasma, mask of pregnancy) may develop on your face. This will likely fade after the baby is born. A dark line from your belly button to  the pubic area (linea nigra) may appear. This will likely fade after the baby is born. You may have changes in your hair. These can include thickening of your hair, rapid growth, and changes in texture. Some women also have hair loss during or after pregnancy, or hair that feels dry or thin. Your hair will most likely return to normal after your baby is born.  What to expect at prenatal visits During a routine prenatal visit: You will be weighed to make sure you and the fetus are growing normally. Your blood pressure will be taken. Your abdomen will be measured to track your baby's growth. The fetal heartbeat will be listened to. Any test results from the previous visit will be discussed.  Your health care provider may ask you: How you are feeling. If you are feeling the baby move. If you have had any abnormal symptoms, such as leaking fluid, bleeding, severe headaches, or abdominal cramping. If you are using any tobacco products, including cigarettes, chewing tobacco, and electronic cigarettes. If you have any questions.  Other tests that may be performed during  your second trimester include: Blood tests that check for: Low iron levels (anemia). High blood sugar that affects pregnant women (gestational diabetes) between 40 and 28 weeks. Rh antibodies. This is to check for a protein on red blood cells (Rh factor). Urine tests to check for infections, diabetes, or protein in the urine. An ultrasound to confirm the proper growth and development of the baby. An amniocentesis to check for possible genetic problems. Fetal screens for spina bifida and Down syndrome. HIV (human immunodeficiency virus) testing. Routine prenatal testing includes screening for HIV, unless you choose not to have this test.  Follow these instructions at home: Medicines Follow your health care provider's instructions regarding medicine use. Specific medicines may be either safe or unsafe to take during  pregnancy. Take a prenatal vitamin that contains at least 600 micrograms (mcg) of folic acid. If you develop constipation, try taking a stool softener if your health care provider approves. Eating and drinking Eat a balanced diet that includes fresh fruits and vegetables, whole grains, good sources of protein such as meat, eggs, or tofu, and low-fat dairy. Your health care provider will help you determine the amount of weight gain that is right for you. Avoid raw meat and uncooked cheese. These carry germs that can cause birth defects in the baby. If you have low calcium  intake from food, talk to your health care provider about whether you should take a daily calcium  supplement. Limit foods that are high in fat and processed sugars, such as fried and sweet foods. To prevent constipation: Drink enough fluid to keep your urine clear or pale yellow. Eat foods that are high in fiber, such as fresh fruits and vegetables, whole grains, and beans. Activity Exercise only as directed by your health care provider. Most women can continue their usual exercise routine during pregnancy. Try to exercise for 30 minutes at least 5 days a week. Stop exercising if you experience uterine contractions. Avoid heavy lifting, wear low heel shoes, and practice good posture. A sexual relationship may be continued unless your health care provider directs you otherwise. Relieving pain and discomfort Wear a good support bra to prevent discomfort from breast tenderness. Take warm sitz baths to soothe any pain or discomfort caused by hemorrhoids. Use hemorrhoid cream if your health care provider approves. Rest with your legs elevated if you have leg cramps or low back pain. If you develop varicose veins, wear support hose. Elevate your feet for 15 minutes, 3-4 times a day. Limit salt in your diet. Prenatal Care Write down your questions. Take them to your prenatal visits. Keep all your prenatal visits as told by your health  care provider. This is important. Safety Wear your seat belt at all times when driving. Make a list of emergency phone numbers, including numbers for family, friends, the hospital, and police and fire departments. General instructions Ask your health care provider for a referral to a local prenatal education class. Begin classes no later than the beginning of month 6 of your pregnancy. Ask for help if you have counseling or nutritional needs during pregnancy. Your health care provider can offer advice or refer you to specialists for help with various needs. Do not use hot tubs, steam rooms, or saunas. Do not douche or use tampons or scented sanitary pads. Do not cross your legs for long periods of time. Avoid cat litter boxes and soil used by cats. These carry germs that can cause birth defects in the baby and possibly loss of the  fetus by miscarriage or stillbirth. Avoid all smoking, herbs, alcohol, and unprescribed drugs. Chemicals in these products can affect the formation and growth of the baby. Do not use any products that contain nicotine or tobacco, such as cigarettes and e-cigarettes. If you need help quitting, ask your health care provider. Visit your dentist if you have not gone yet during your pregnancy. Use a soft toothbrush to brush your teeth and be gentle when you floss. Contact a health care provider if: You have dizziness. You have mild pelvic cramps, pelvic pressure, or nagging pain in the abdominal area. You have persistent nausea, vomiting, or diarrhea. You have a bad smelling vaginal discharge. You have pain when you urinate. Get help right away if: You have a fever. You are leaking fluid from your vagina. You have spotting or bleeding from your vagina. You have severe abdominal cramping or pain. You have rapid weight gain or weight loss. You have shortness of breath with chest pain. You notice sudden or extreme swelling of your face, hands, ankles, feet, or legs. You  have not felt your baby move in over an hour. You have severe headaches that do not go away when you take medicine. You have vision changes. Summary The second trimester is from week 14 through week 27 (months 4 through 6). It is also a time when the fetus is growing rapidly. Your body goes through many changes during pregnancy. The changes vary from woman to woman. Avoid all smoking, herbs, alcohol, and unprescribed drugs. These chemicals affect the formation and growth your baby. Do not use any tobacco products, such as cigarettes, chewing tobacco, and e-cigarettes. If you need help quitting, ask your health care provider. Contact your health care provider if you have any questions. Keep all prenatal visits as told by your health care provider. This is important. This information is not intended to replace advice given to you by your health care provider. Make sure you discuss any questions you have with your health care provider. Document Released: 10/23/2001 Document Revised: 04/05/2016 Document Reviewed: 12/30/2012 Elsevier Interactive Patient Education  2017 [employer].

## 2024-09-01 ENCOUNTER — Other Ambulatory Visit: Payer: Self-pay

## 2024-09-02 ENCOUNTER — Encounter: Payer: Self-pay | Admitting: Advanced Practice Midwife

## 2024-09-16 ENCOUNTER — Encounter: Payer: Self-pay | Admitting: Family Medicine

## 2024-09-21 ENCOUNTER — Other Ambulatory Visit: Payer: Self-pay | Admitting: Obstetrics & Gynecology

## 2024-09-21 DIAGNOSIS — Z362 Encounter for other antenatal screening follow-up: Secondary | ICD-10-CM

## 2024-09-23 ENCOUNTER — Encounter: Payer: Self-pay | Admitting: Family Medicine

## 2024-09-23 ENCOUNTER — Ambulatory Visit: Admitting: Family Medicine

## 2024-09-23 ENCOUNTER — Encounter: Payer: Self-pay | Admitting: Women's Health

## 2024-09-23 ENCOUNTER — Other Ambulatory Visit

## 2024-09-23 ENCOUNTER — Ambulatory Visit (INDEPENDENT_AMBULATORY_CARE_PROVIDER_SITE_OTHER): Admitting: Women's Health

## 2024-09-23 VITALS — BP 133/83 | HR 96 | Ht 60.0 in | Wt 247.0 lb

## 2024-09-23 VITALS — BP 107/72 | HR 93 | Wt 247.4 lb

## 2024-09-23 DIAGNOSIS — Z419 Encounter for procedure for purposes other than remedying health state, unspecified: Secondary | ICD-10-CM | POA: Diagnosis not present

## 2024-09-23 DIAGNOSIS — O99212 Obesity complicating pregnancy, second trimester: Secondary | ICD-10-CM | POA: Diagnosis not present

## 2024-09-23 DIAGNOSIS — O0992 Supervision of high risk pregnancy, unspecified, second trimester: Secondary | ICD-10-CM

## 2024-09-23 DIAGNOSIS — Z141 Cystic fibrosis carrier: Secondary | ICD-10-CM

## 2024-09-23 DIAGNOSIS — O24419 Gestational diabetes mellitus in pregnancy, unspecified control: Secondary | ICD-10-CM

## 2024-09-23 DIAGNOSIS — Z362 Encounter for other antenatal screening follow-up: Secondary | ICD-10-CM

## 2024-09-23 DIAGNOSIS — O3122X0 Continuing pregnancy after intrauterine death of one fetus or more, second trimester, not applicable or unspecified: Secondary | ICD-10-CM

## 2024-09-23 DIAGNOSIS — L309 Dermatitis, unspecified: Secondary | ICD-10-CM

## 2024-09-23 DIAGNOSIS — O24415 Gestational diabetes mellitus in pregnancy, controlled by oral hypoglycemic drugs: Secondary | ICD-10-CM | POA: Diagnosis not present

## 2024-09-23 DIAGNOSIS — E669 Obesity, unspecified: Secondary | ICD-10-CM | POA: Diagnosis not present

## 2024-09-23 DIAGNOSIS — Z6841 Body Mass Index (BMI) 40.0 and over, adult: Secondary | ICD-10-CM

## 2024-09-23 DIAGNOSIS — Z3A23 23 weeks gestation of pregnancy: Secondary | ICD-10-CM

## 2024-09-23 MED ORDER — TRIAMCINOLONE ACETONIDE 0.5 % EX OINT
1.0000 | TOPICAL_OINTMENT | Freq: Two times a day (BID) | CUTANEOUS | 1 refills | Status: AC | PRN
Start: 2024-09-23 — End: ?

## 2024-09-23 NOTE — Progress Notes (Signed)
 Subjective:  Patient ID: Nancy Bishop, female    DOB: 2001/01/25  Age: 23 y.o. MRN: [id number]  CC:   Chief Complaint  Patient presents with   Eczema    Along thighs, arms and hands    HPI:  23 year old female presents for evaluation of the above.  Patient reports recent worsening of eczema.  She has scattered areas on the arms, hands, and thighs.  She is currently [redacted] weeks pregnant.  Will discuss treatment options today.   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   Supervision of high risk pregnancy, antepartum 06/30/2024   Fetal demise due to miscarriage 06/24/2024   Onycholysis 05/05/2024   Prediabetes 02/03/2024   Eczema 08/28/2023   Chronic pain syndrome 06/26/2023   Severe obesity (BMI >= 40) (HCC) 05/29/2023   Chronic low back pain 11/26/2022   Anxiety and depression 10/15/2022   Fibromyalgia 03/12/2018   Hypermobility syndrome 10/22/2016    Social Hx   Social History   Socioeconomic History   Marital status: Married    Spouse name: Tashera Montalvo   Number of children: Not on file   Years of education: Not on file   Highest education level: Associate degree: academic program  Occupational History    Employer: [employer]  Tobacco Use   Smoking status: Never   Smokeless tobacco: Never  Vaping Use   Vaping status: Never Used  Substance and Sexual Activity   Alcohol use: Not Currently    Comment: occas   Drug use: Never   Sexual activity: Yes    Birth control/protection: None  Other Topics Concern   Not on file  Social History Narrative   ** Merged History Encounter **       Right handed  Caffeine- None  Live's at home with mom and dad     Social Drivers of Health   Financial Resource Strain: Low Risk  (09/23/2024)   Overall Financial Resource Strain (CARDIA)    Difficulty of Paying Living Expenses: Not hard at all  Food Insecurity: No Food Insecurity  (09/23/2024)   Hunger Vital Sign    Worried About Running Out of Food in the Last Year: Never true    Ran Out of Food in the Last Year: Never true  Transportation Needs: No Transportation Needs (09/23/2024)   PRAPARE - Administrator, Civil Service (Medical): No    Lack of Transportation (Non-Medical): No  Physical Activity: Inactive (09/23/2024)   Exercise Vital Sign    Days of Exercise per Week: 0 days    Minutes of Exercise per Session: Not on file  Stress: Stress Concern Present (09/23/2024)   [employer] of Occupational Health - Occupational Stress Questionnaire    Feeling of Stress: To some extent  Social Connections: Socially Integrated (09/23/2024)   Social Connection and Isolation Panel    Frequency of Communication with Friends and Family: More than three times a week    Frequency of Social Gatherings with Friends and Family: More than three times a week    Attends Religious Services: More than 4 times per year    Active Member of [employer] or Organizations: Yes    Attends Banker Meetings: 1 to 4 times per year    Marital Status: Married    Review of Systems Per HPI  Objective:  BP 133/83   Pulse 96   Ht 5' (1.524 m)   [employer]  247 lb (112 kg)   LMP 04/14/2024 (Exact Date)   SpO2 99%   BMI 48.24 kg/m      09/23/2024    1:52 PM 09/23/2024   11:00 AM 08/26/2024    3:11 PM  BP/Weight  Systolic BP 133 107 104  Diastolic BP 83 72 72  Wt. (Lbs) 247 247.4 250  BMI 48.24 kg/m2 48.32 kg/m2 48.82 kg/m2    Physical Exam Vitals and nursing note reviewed.  Constitutional:      General: She is not in acute distress. HENT:     Head: Normocephalic and atraumatic.  Pulmonary:     Effort: Pulmonary effort is normal. No respiratory distress.  Skin:    Comments: Skin dry patches.  Neurological:     Mental Status: She is alert.     Lab Results  Component Value Date   WBC 11.2 (H) 07/01/2024   HGB 13.5 07/01/2024   HCT 41.8 07/01/2024    PLT 334 07/01/2024   GLUCOSE 117 (H) 06/26/2023   CHOL 164 10/15/2022   TRIG 225 (H) 10/15/2022   HDL 39 (L) 10/15/2022   LDLCALC 87 10/15/2022   ALT 14 06/26/2023   AST 14 06/26/2023   NA 138 06/26/2023   K 4.4 06/26/2023   CL 100 06/26/2023   CREATININE 0.64 06/26/2023   BUN 8 06/26/2023   CO2 24 06/26/2023   TSH 2.500 01/29/2024   HGBA1C 5.7 (H) 07/01/2024     Assessment & Plan:  Eczema, unspecified type Assessment & Plan: Advised mild unscented soap.  Use of daily moisturizing lotion such as CeraVe. Use topical steroid sparingly.  Orders: -     Triamcinolone  Acetonide; Apply 1 Application topically 2 (two) times daily as needed.  Dispense: 30 g; Refill: 1   Johany Hansman DO [facility]

## 2024-09-23 NOTE — Progress Notes (Signed)
 HIGH-RISK PREGNANCY VISIT Patient name: Nancy Bishop MRN [id number]  Date of birth: May 07, 2001 Chief Complaint:   Routine Prenatal Visit and Pregnancy Ultrasound  History of Present Illness:   Nancy Bishop is a 23 y.o. G22P1001 female at [redacted]w[redacted]d with an Estimated Date of Delivery: 01/19/25 being seen today for ongoing management of a high-risk pregnancy complicated by diabetes mellitus A2/BDM currently on metformin  500mg  AM/1000mg  PM and PG BMI 49.    Today she reports all sugars wnl. Contractions: Not present.  .  Movement: Present. denies leaking of fluid.      07/01/2024    3:57 PM 05/05/2024    8:42 AM 03/12/2024    4:03 PM 01/29/2024    1:13 PM 10/25/2023   10:24 AM  Depression screen PHQ 2/9  Decreased Interest 1 1 0 0 1  Down, Depressed, Hopeless 0 1 0 0 0  PHQ - 2 Score 1 2 0 0 1  Altered sleeping 1 1 2 1 2   Tired, decreased energy 1 1 1 1 1   Change in appetite 1 1 1 1 1   Feeling bad or failure about yourself  0 0 0 0 2  Trouble concentrating 1 1 1 1  0  Moving slowly or fidgety/restless 0 0 0 0 0  Suicidal thoughts 0 0 0 0 0  PHQ-9 Score 5  6  5  4  7    Difficult doing work/chores  Not difficult at all Not difficult at all Not difficult at all Somewhat difficult     Data saved with a previous flowsheet row definition        07/01/2024    3:57 PM 05/05/2024    8:42 AM 03/12/2024    4:03 PM 01/29/2024    1:14 PM  GAD 7 : Generalized Anxiety Score  Nervous, Anxious, on Edge 1 1 0 1  Control/stop worrying 1 1 0 0  Worry too much - different things 1 1 1  0  Trouble relaxing 1 1 1  0  Restless 1 1 1  0  Easily annoyed or irritable 1 2 1 2   Afraid - awful might happen 1 1 0 0  Total GAD 7 Score 7 8 4 3   Anxiety Difficulty  Not difficult at all Not difficult at all Not difficult at all     Review of Systems:   Pertinent items are noted in HPI Denies abnormal vaginal discharge w/ itching/odor/irritation, headaches, visual changes, shortness of breath, chest pain,  abdominal pain, severe nausea/vomiting, or problems with urination or bowel movements unless otherwise stated above. Pertinent History Reviewed:  Reviewed past medical,surgical, social, obstetrical and family history.  Reviewed problem list, medications and allergies. Physical Assessment:   Vitals:   09/23/24 1100  BP: 107/72  Pulse: 93  Weight: 247 lb 6.4 oz (112.2 kg)  Body mass index is 48.32 kg/m.           Physical Examination:   General appearance: alert, well appearing, and in no distress  Mental status: alert, oriented to person, place, and time  Skin: warm & dry   Extremities: Edema: None    Cardiovascular: normal heart rate noted  Respiratory: normal respiratory effort, no distress  Abdomen: gravid, soft, non-tender  Pelvic: Cervical exam deferred         Fetal Status:     Movement: Present    Fetal Surveillance Testing today: US  23+1 wks,breech,CX 4.1 cm,anterior placenta gr 0,SVP of fluid 4.4 cm,FHR 160 bpm,EFW 559 g 38%,anatomy of the spine complete,no obvious abnormalities  Chaperone: N/A  No results found for this or any previous visit (from the past 24 hours).  Assessment & Plan:  High-risk pregnancy: G2P1001 at [redacted]w[redacted]d with an Estimated Date of Delivery: 01/19/25   1) A2/BDM, stable on metformin  500mg  AM/1000mg PM, EFW today 38%/normal AFI  2) PGBMI 49, currently 48  Meds: No orders of the defined types were placed in this encounter.   Labs/procedures today: U/S  Treatment Plan:  EFW q 4w    2x/wk NST or weekly bpp @ 32wks     Deliver @ 39-39.6wks:______   Reviewed: Preterm labor symptoms and general obstetric precautions including but not limited to vaginal bleeding, contractions, leaking of fluid and fetal movement were reviewed in detail with the patient.  All questions were answered. Does have home bp cuff. Office bp cuff given: not applicable. Check bp weekly, let us  know if consistently >140 and/or >90.  Follow-up: Return in about 4 weeks (around  10/21/2024) for HROB, US :EFW, PN2 (no GTT), MD, in person (EFW q4, 2x/wk or weekly bpp @ 32wk).   Future Appointments  Date Time Provider Department Center  09/23/2024  1:50 PM Cook, Jayce G, DO [facility] [address]  10/21/2024 10:00 AM [facility] - [facility] US  [facility] None  10/21/2024  2:30 PM Kizzie Suzen SAUNDERS, CNM [facility]  11/18/2024 10:00 AM [facility] - [facility] US  [facility] None  12/17/2024 10:00 AM [facility] - [facility] US  [facility] None  01/14/2025 10:00 AM [facility] - [facility] US  [facility] None    No orders of the defined types were placed in this encounter.  Suzen SAUNDERS Kizzie CNM, [facility] 09/23/2024 11:24 AM

## 2024-09-23 NOTE — Patient Instructions (Signed)
 Makenzye, thank you for choosing our office today! We appreciate the opportunity to meet your healthcare needs. You may receive a short survey by mail, e-mail, or through [employer]. If you are happy with your care we would appreciate if you could take just a few minutes to complete the survey questions. We read all of your comments and take your feedback very seriously. Thank you again for choosing our office.  Center for [employer] Team at [facility]  [facility] at [facility] ([address] [place], [place] [address]) Entrance C, located off of [address] Free 24/7 valet parking   CLASSES: Go to [email or web address] to register for classes (childbirth, breastfeeding, waterbirth, infant CPR, daddy bootcamp, etc.)  Call the office [phone]) or go to [facility] if: You begin to have strong, frequent contractions Your water breaks.  Sometimes it is a big gush of fluid, sometimes it is just a trickle that keeps getting your panties wet or running down your legs You have vaginal bleeding.  It is normal to have a small amount of spotting if your cervix was checked.  You don't feel your baby moving like normal.  If you don't, get you something to eat and drink and lay down and focus on feeling your baby move.   If your baby is still not moving like normal, you should call the office or go to [facility].  Call the office [phone]) or go to [facility] hospital for these signs of pre-eclampsia: Severe headache that does not go away with Tylenol  Visual changes- seeing spots, double, blurred vision Pain under your right breast or upper abdomen that does not go away with Tums or heartburn medicine Nausea and/or vomiting Severe swelling in your hands, feet, and face    [place] Pediatricians/Family Doctors  [facility]): [address] Luba BROCKS, [phone]           [facility]: [address], [phone]                 [facility]): [address], [phone]  [facility] Department: [address], [place], [phone]    [facility] Pediatricians/Family Doctors [facility]): [address], [phone] [facility]: [address] [place], [phone] [facility]: [address], [phone]  [facility] Doctors  [facility]): [phone] [facility]: [address] [phone]   [facility] Doctors [facility]: [address], [phone]  [facility] Doctors  [facility]: [phone], [phone]  Home Blood Pressure Monitoring for Patients   Your provider has recommended that you check your blood pressure (BP) at least once a week at home. If you do not have a blood pressure cuff at home, one will be provided for you. Contact your provider if you have not received your monitor within 1 week.   Helpful Tips for Accurate Home Blood Pressure Checks  Don't smoke, exercise, or drink caffeine 30 minutes before checking your BP Use the restroom before checking your BP (a full bladder can raise your pressure) Relax in a comfortable upright chair Feet on the ground Left arm resting comfortably on a flat surface at the level of your heart Legs uncrossed Back supported Sit quietly and don't talk Place the cuff on your bare arm Adjust snuggly, so that only two fingertips can fit between your  skin and the top of the cuff Check 2 readings separated by at least one minute Keep a log of your BP readings For a visual, please reference this diagram: http://ccnc.care/bpdiagram  Provider Name: [facility]     Phone: [phone]  Zone 1: ALL CLEAR  Continue to monitor your symptoms:  BP reading is less than 140 (top number) or less than 90 (bottom number)  No right upper stomach pain No headaches or  seeing spots No feeling nauseated or throwing up No swelling in face and hands  Zone 2: CAUTION Call your doctor's office for any of the following:  BP reading is greater than 140 (top number) or greater than 90 (bottom number)  Stomach pain under your ribs in the middle or right side Headaches or seeing spots Feeling nauseated or throwing up Swelling in face and hands  Zone 3: EMERGENCY  Seek immediate medical care if you have any of the following:  BP reading is greater than160 (top number) or greater than 110 (bottom number) Severe headaches not improving with Tylenol  Serious difficulty catching your breath Any worsening symptoms from Zone 2   Second Trimester of Pregnancy The second trimester is from week 13 through week 28, months 4 through 6. The second trimester is often a time when you feel your best. Your body has also adjusted to being pregnant, and you begin to feel better physically. Usually, morning sickness has lessened or quit completely, you may have more energy, and you may have an increase in appetite. The second trimester is also a time when the fetus is growing rapidly. At the end of the sixth month, the fetus is about 9 inches long and weighs about 1 pounds. You will likely begin to feel the baby move (quickening) between 18 and 20 weeks of the pregnancy. BODY CHANGES Your body goes through many changes during pregnancy. The changes vary from woman to woman.  Your weight will continue to increase. You will notice your lower abdomen bulging out. You may begin to get stretch marks on your hips, abdomen, and breasts. You may develop headaches that can be relieved by medicines approved by your health care provider. You may urinate more often because the fetus is pressing on your bladder. You may develop or continue to have heartburn as a result of your pregnancy. You may develop constipation because certain hormones are causing the muscles that push waste through your  intestines to slow down. You may develop hemorrhoids or swollen, bulging veins (varicose veins). You may have back pain because of the weight gain and pregnancy hormones relaxing your joints between the bones in your pelvis and as a result of a shift in weight and the muscles that support your balance. Your breasts will continue to grow and be tender. Your gums may bleed and may be sensitive to brushing and flossing. Dark spots or blotches (chloasma, mask of pregnancy) may develop on your face. This will likely fade after the baby is born. A dark line from your belly button to the pubic area (linea nigra) may appear. This will likely fade after the baby is born. You may have changes in your hair. These can include thickening of your hair, rapid growth, and changes in texture. Some women also have hair loss during or after pregnancy, or hair that feels dry or thin. Your hair will most likely return to normal after your baby is born. WHAT TO EXPECT AT YOUR PRENATAL VISITS During a routine prenatal visit: You will  be weighed to make sure you and the fetus are growing normally. Your blood pressure will be taken. Your abdomen will be measured to track your baby's growth. The fetal heartbeat will be listened to. Any test results from the previous visit will be discussed. Your health care provider may ask you: How you are feeling. If you are feeling the baby move. If you have had any abnormal symptoms, such as leaking fluid, bleeding, severe headaches, or abdominal cramping. If you have any questions. Other tests that may be performed during your second trimester include: Blood tests that check for: Low iron levels (anemia). Gestational diabetes (between 24 and 28 weeks). Rh antibodies. Urine tests to check for infections, diabetes, or protein in the urine. An ultrasound to confirm the proper growth and development of the baby. An amniocentesis to check for possible genetic problems. Fetal  screens for spina bifida and Down syndrome. HOME CARE INSTRUCTIONS  Avoid all smoking, herbs, alcohol, and unprescribed drugs. These chemicals affect the formation and growth of the baby. Follow your health care provider's instructions regarding medicine use. There are medicines that are either safe or unsafe to take during pregnancy. Exercise only as directed by your health care provider. Experiencing uterine cramps is a good sign to stop exercising. Continue to eat regular, healthy meals. Wear a good support bra for breast tenderness. Do not use hot tubs, steam rooms, or saunas. Wear your seat belt at all times when driving. Avoid raw meat, uncooked cheese, cat litter boxes, and soil used by cats. These carry germs that can cause birth defects in the baby. Take your prenatal vitamins. Try taking a stool softener (if your health care provider approves) if you develop constipation. Eat more high-fiber foods, such as fresh vegetables or fruit and whole grains. Drink plenty of fluids to keep your urine clear or pale yellow. Take warm sitz baths to soothe any pain or discomfort caused by hemorrhoids. Use hemorrhoid cream if your health care provider approves. If you develop varicose veins, wear support hose. Elevate your feet for 15 minutes, 3-4 times a day. Limit salt in your diet. Avoid heavy lifting, wear low heel shoes, and practice good posture. Rest with your legs elevated if you have leg cramps or low back pain. Visit your dentist if you have not gone yet during your pregnancy. Use a soft toothbrush to brush your teeth and be gentle when you floss. A sexual relationship may be continued unless your health care provider directs you otherwise. Continue to go to all your prenatal visits as directed by your health care provider. SEEK MEDICAL CARE IF:  You have dizziness. You have mild pelvic cramps, pelvic pressure, or nagging pain in the abdominal area. You have persistent nausea, vomiting, or  diarrhea. You have a bad smelling vaginal discharge. You have pain with urination. SEEK IMMEDIATE MEDICAL CARE IF:  You have a fever. You are leaking fluid from your vagina. You have spotting or bleeding from your vagina. You have severe abdominal cramping or pain. You have rapid weight gain or loss. You have shortness of breath with chest pain. You notice sudden or extreme swelling of your face, hands, ankles, feet, or legs. You have not felt your baby move in over an hour. You have severe headaches that do not go away with medicine. You have vision changes. Document Released: 10/23/2001 Document Revised: 11/03/2013 Document Reviewed: 12/30/2012 [facility] Patient Information 2015 [place], [place]. This information is not intended to replace advice given to you by your  health care provider. Make sure you discuss any questions you have with your health care provider.

## 2024-09-23 NOTE — Patient Instructions (Addendum)
-   Use a mild, unscented soap. >>> Dove, Aveeno. - After the bath apply a good lotion (unscented).  Here are a few recommendations: CeraVe, Aveeno eczema, Aquafor, Eucerin. - Use the ointment as indicated.  Do use chronically.

## 2024-09-23 NOTE — Assessment & Plan Note (Signed)
 Advised mild unscented soap.  Use of daily moisturizing lotion such as CeraVe. Use topical steroid sparingly.

## 2024-09-23 NOTE — Progress Notes (Signed)
 US  23+1 wks,breech,CX 4.1 cm,anterior placenta gr 0,SVP of fluid 4.4 cm,FHR 160 bpm,EFW 559 g 38%,anatomy of the spine complete,no obvious abnormalities

## 2024-10-06 ENCOUNTER — Other Ambulatory Visit: Payer: Self-pay

## 2024-10-06 DIAGNOSIS — O0992 Supervision of high risk pregnancy, unspecified, second trimester: Secondary | ICD-10-CM

## 2024-10-06 DIAGNOSIS — Z3A27 27 weeks gestation of pregnancy: Secondary | ICD-10-CM

## 2024-10-20 ENCOUNTER — Other Ambulatory Visit: Payer: Self-pay | Admitting: Obstetrics & Gynecology

## 2024-10-20 DIAGNOSIS — O24419 Gestational diabetes mellitus in pregnancy, unspecified control: Secondary | ICD-10-CM

## 2024-10-21 ENCOUNTER — Other Ambulatory Visit

## 2024-10-21 ENCOUNTER — Encounter: Payer: Self-pay | Admitting: Obstetrics & Gynecology

## 2024-10-21 ENCOUNTER — Ambulatory Visit

## 2024-10-21 ENCOUNTER — Ambulatory Visit: Admitting: Obstetrics & Gynecology

## 2024-10-21 VITALS — BP 123/85 | HR 99 | Wt 245.6 lb

## 2024-10-21 DIAGNOSIS — Z3A27 27 weeks gestation of pregnancy: Secondary | ICD-10-CM

## 2024-10-21 DIAGNOSIS — Z141 Cystic fibrosis carrier: Secondary | ICD-10-CM

## 2024-10-21 DIAGNOSIS — O24319 Unspecified pre-existing diabetes mellitus in pregnancy, unspecified trimester: Secondary | ICD-10-CM

## 2024-10-21 DIAGNOSIS — O3122X0 Continuing pregnancy after intrauterine death of one fetus or more, second trimester, not applicable or unspecified: Secondary | ICD-10-CM

## 2024-10-21 DIAGNOSIS — O0990 Supervision of high risk pregnancy, unspecified, unspecified trimester: Secondary | ICD-10-CM

## 2024-10-21 DIAGNOSIS — O24419 Gestational diabetes mellitus in pregnancy, unspecified control: Secondary | ICD-10-CM

## 2024-10-21 DIAGNOSIS — Z6841 Body Mass Index (BMI) 40.0 and over, adult: Secondary | ICD-10-CM

## 2024-10-21 NOTE — Progress Notes (Signed)
 HIGH-RISK PREGNANCY VISIT Patient name: Nancy Bishop MRN [id number]  Date of birth: 12-17-2000 Chief Complaint:   Routine Prenatal Visit  History of Present Illness:   Soua Lenk is a 23 y.o. G43P1001 female at [redacted]w[redacted]d with an Estimated Date of Delivery: 01/19/25 being seen today for ongoing management of a high-risk pregnancy complicated by:  - Class B diabetes Currently on Metformin  500mg  am/ 1000mg  pm Sugars reviewed well-controlled no abnormal ranges - Obesity - Asymptomatic bacteriuria, s/p treatment  Today she reports no complaints.   Contractions: Not present. Vag. Bleeding: None.  Movement: Present. denies leaking of fluid.      09/23/2024    1:53 PM 07/01/2024    3:57 PM 05/05/2024    8:42 AM 03/12/2024    4:03 PM 01/29/2024    1:13 PM  Depression screen PHQ 2/9  Decreased Interest 1 1 1  0 0  Down, Depressed, Hopeless 0 0 1 0 0  PHQ - 2 Score 1 1 2  0 0  Altered sleeping 1 1 1 2 1   Tired, decreased energy 1 1 1 1 1   Change in appetite 1 1 1 1 1   Feeling bad or failure about yourself  0 0 0 0 0  Trouble concentrating 1 1 1 1 1   Moving slowly or fidgety/restless 0 0 0 0 0  Suicidal thoughts 0 0 0 0 0  PHQ-9 Score 5 5  6  5  4    Difficult doing work/chores Somewhat difficult  Not difficult at all Not difficult at all Not difficult at all     Data saved with a previous flowsheet row definition     Current Outpatient Medications  Medication Instructions   Accu-Chek Softclix Lancets lancets Check blood sugar four times daily   aspirin  162 mg, Oral, Daily   Blood Glucose Monitoring Suppl (ACCU-CHEK GUIDE ME) w/Device KIT 1 kit, Does not apply, As directed, Check blood sugar four times daily   Continuous Glucose Sensor (DEXCOM G7 SENSOR) MISC USE TO MONITOR BLOOD SUGAR CONTINUOUSLY. CHANGE SENSOR EVERY 10 DAYS.   cyclobenzaprine  (FLEXERIL ) 10 mg, Oral, Every 8 hours PRN   glucose blood (ACCU-CHEK GUIDE TEST) test strip Check blood sugar four time daily    metFORMIN  (GLUCOPHAGE ) 500 MG tablet Take 500mg  (1 tablet) in the morning and 1,000mg  (2 tablets) at bedtime   Prenatal Vit-Fe Fumarate-FA (PRENATAL VITAMIN PO) Take by mouth.   triamcinolone  ointment (KENALOG ) 0.5 % 1 Application, Topical, 2 times daily PRN     Review of Systems:   Pertinent items are noted in HPI Denies abnormal vaginal discharge w/ itching/odor/irritation, headaches, visual changes, shortness of breath, chest pain, abdominal pain, severe nausea/vomiting, or problems with urination or bowel movements unless otherwise stated above. Pertinent History Reviewed:  Reviewed past medical,surgical, social, obstetrical and family history.  Reviewed problem list, medications and allergies. Physical Assessment:   Vitals:   10/21/24 1103  BP: 123/85  Pulse: 99  Weight: 245 lb 9.6 oz (111.4 kg)  Body mass index is 47.97 kg/m.           Physical Examination:   General appearance: alert, well appearing, and in no distress  Mental status: normal mood, behavior, speech, dress, motor activity, and thought processes  Skin: warm & dry   Extremities:      Cardiovascular: normal heart rate noted  Respiratory: normal respiratory effort, no distress  Abdomen: gravid, soft, non-tender  Pelvic: Cervical exam deferred         Fetal Status:  Movement: Present    Fetal Surveillance Testing today: breech,anterior placenta gr 0,CX 4 cm,FHR 150 bpm,AFI 13 cm,EFW 920 g 14%,AC 27%,FL 5%    Chaperone: N/A    No results found for this or any previous visit (from the past 24 hours).   Assessment & Plan:  High-risk pregnancy: G2P1001 at [redacted]w[redacted]d with an Estimated Date of Delivery: 01/19/25   1) Class B DM No change to current meds Normal growth scan today, continue monthly Reviewed plan for antepartum testing Also discussed IOL as patient was wanting as natural as possible.  Discussed current goal 39wk though may change pending fetal status.  Questions/concerns were addressed   2)  Obesity  Meds: No orders of the defined types were placed in this encounter.   Labs/procedures today: growth  Treatment Plan: Routine OB care and as outlined above  Reviewed: Preterm labor symptoms and general obstetric precautions including but not limited to vaginal bleeding, contractions, leaking of fluid and fetal movement were reviewed in detail with the patient.  All questions were answered. Pt has home bp cuff. Check bp weekly, let us  know if >140/90.   Follow-up: Return in about 3 weeks (around 11/11/2024) for HROB visit and continue growth every 4wks and 32wk twice weekly BPP/NST, to lab today*)-.   Future Appointments  Date Time Provider Department Center  11/18/2024 10:00 AM [facility] US  [facility] None  11/18/2024 10:50 AM Kizzie Suzen SAUNDERS, CNM [facility]  11/26/2024  9:15 AM [facility] - [facility] US  [facility] None  11/26/2024 10:10 AM Jayne Vonn DEL, MD [facility]  11/30/2024  9:50 AM [facility] NURSE [facility]  12/03/2024 10:00 AM [facility] - [facility] US  [facility] None  12/03/2024 10:50 AM Kizzie Suzen SAUNDERS, CNM [facility]  12/07/2024 10:10 AM [facility] NURSE [facility]  12/10/2024 10:00 AM [facility] - [facility] US  [facility] None  12/10/2024 10:50 AM Marilynn Nest, DO [facility]  12/14/2024 10:10 AM [facility] NURSE [facility]  12/17/2024 10:00 AM [facility] - [facility] US  [facility] None  12/17/2024 10:50 AM Newton Mering, CNM [facility]  12/21/2024 10:10 AM [facility] NURSE [facility]  12/24/2024 10:00 AM [facility] - [facility] US  [facility] None  12/28/2024 10:10 AM [facility] NURSE [facility]  12/31/2024 10:00 AM [facility] - [facility] US  [facility] None  01/04/2025 10:10 AM [facility] NURSE [facility]  01/07/2025 10:00 AM [facility] - [facility] US  [facility] None  01/11/2025  9:50 AM [facility] NURSE [facility]  01/14/2025 10:00 AM [facility] - [facility] US  [facility] None  01/18/2025 10:10 AM [facility] NURSE [facility]    No orders of the defined types were placed in this  encounter.   Taylia Berber, DO Attending Obstetrician & Gynecologist, [facility] for [employer], [facility]

## 2024-10-21 NOTE — Progress Notes (Signed)
 US  27+1 wks,breech,anterior placenta gr 0,CX 4 cm,FHR 150 bpm,AFI 13 cm,EFW 920 g 14%,AC 27%,FL 5%

## 2024-10-22 ENCOUNTER — Ambulatory Visit: Payer: Self-pay | Admitting: Obstetrics & Gynecology

## 2024-10-22 LAB — CBC
Hematocrit: 39.8 % (ref 34.0–46.6)
Hemoglobin: 12.8 g/dL (ref 11.1–15.9)
MCH: 27.1 pg (ref 26.6–33.0)
MCHC: 32.2 g/dL (ref 31.5–35.7)
MCV: 84 fL (ref 79–97)
Platelets: 324 x10E3/uL (ref 150–450)
RBC: 4.72 x10E6/uL (ref 3.77–5.28)
RDW: 12.3 % (ref 11.7–15.4)
WBC: 11.2 x10E3/uL — ABNORMAL HIGH (ref 3.4–10.8)

## 2024-10-22 LAB — SYPHILIS: RPR W/REFLEX TO RPR TITER AND TREPONEMAL ANTIBODIES, TRADITIONAL SCREENING AND DIAGNOSIS ALGORITHM: RPR Ser Ql: NONREACTIVE

## 2024-10-22 LAB — ANTIBODY SCREEN: Antibody Screen: NEGATIVE

## 2024-10-22 LAB — HIV ANTIBODY (ROUTINE TESTING W REFLEX): HIV Screen 4th Generation wRfx: NONREACTIVE

## 2024-10-29 ENCOUNTER — Telehealth: Payer: Self-pay | Admitting: Women's Health

## 2024-10-29 ENCOUNTER — Encounter: Payer: Self-pay | Admitting: Women's Health

## 2024-11-09 ENCOUNTER — Encounter: Payer: Self-pay | Admitting: Women's Health

## 2024-11-16 ENCOUNTER — Encounter: Payer: Self-pay | Admitting: Women's Health

## 2024-11-17 ENCOUNTER — Other Ambulatory Visit: Payer: Self-pay | Admitting: Women's Health

## 2024-11-17 DIAGNOSIS — O24415 Gestational diabetes mellitus in pregnancy, controlled by oral hypoglycemic drugs: Secondary | ICD-10-CM

## 2024-11-17 DIAGNOSIS — Z3A15 15 weeks gestation of pregnancy: Secondary | ICD-10-CM

## 2024-11-17 MED ORDER — DEXCOM G7 SENSOR MISC: 6 refills | Status: AC

## 2024-11-18 ENCOUNTER — Other Ambulatory Visit

## 2024-11-18 ENCOUNTER — Encounter: Admitting: Women's Health

## 2024-11-19 ENCOUNTER — Ambulatory Visit: Admitting: Women's Health

## 2024-11-19 ENCOUNTER — Ambulatory Visit (INDEPENDENT_AMBULATORY_CARE_PROVIDER_SITE_OTHER)

## 2024-11-19 ENCOUNTER — Encounter: Payer: Self-pay | Admitting: Women's Health

## 2024-11-19 VITALS — BP 118/78 | HR 94 | Wt 244.0 lb

## 2024-11-19 DIAGNOSIS — O24419 Gestational diabetes mellitus in pregnancy, unspecified control: Secondary | ICD-10-CM | POA: Diagnosis not present

## 2024-11-19 DIAGNOSIS — O0993 Supervision of high risk pregnancy, unspecified, third trimester: Secondary | ICD-10-CM

## 2024-11-19 DIAGNOSIS — O24415 Gestational diabetes mellitus in pregnancy, controlled by oral hypoglycemic drugs: Secondary | ICD-10-CM

## 2024-11-19 DIAGNOSIS — Z6841 Body Mass Index (BMI) 40.0 and over, adult: Secondary | ICD-10-CM

## 2024-11-19 DIAGNOSIS — Z3A31 31 weeks gestation of pregnancy: Secondary | ICD-10-CM

## 2024-11-19 DIAGNOSIS — O0990 Supervision of high risk pregnancy, unspecified, unspecified trimester: Secondary | ICD-10-CM

## 2024-11-19 DIAGNOSIS — O24319 Unspecified pre-existing diabetes mellitus in pregnancy, unspecified trimester: Secondary | ICD-10-CM

## 2024-11-19 DIAGNOSIS — Z141 Cystic fibrosis carrier: Secondary | ICD-10-CM

## 2024-11-19 NOTE — Progress Notes (Signed)
 "  HIGH-RISK PREGNANCY VISIT Patient name: Nancy Bishop MRN [id number]  Date of birth: 10/03/2001 Chief Complaint:   No chief complaint on file.  History of Present Illness:   Nancy Bishop is a 24 y.o. G63P1001 female at [redacted]w[redacted]d with an Estimated Date of Delivery: 01/19/25 being seen today for ongoing management of a high-risk pregnancy complicated by A2DM (metformin  500AM/1000PM), PGBMI 49.    Today she brought her log, fastings 71-91, 2hr pp 63-116. Since 12/17 sugars have been much lower, sent MyChart messages, stopped metformin  for about 2 weeks-all sugars were wnl during this time, restarted metformin  on her own but at 500mg  BID b/c noticed sugars were a little higher (but still normal)- all sugars have still been wnl.  Contractions: Not present. Vag. Bleeding: None.  Movement: Present. denies leaking of fluid.      09/23/2024    1:53 PM 07/01/2024    3:57 PM 05/05/2024    8:42 AM 03/12/2024    4:03 PM 01/29/2024    1:13 PM  Depression screen PHQ 2/9  Decreased Interest 1 1 1  0 0  Down, Depressed, Hopeless 0 0 1 0 0  PHQ - 2 Score 1 1 2  0 0  Altered sleeping 1 1 1 2 1   Tired, decreased energy 1 1 1 1 1   Change in appetite 1 1 1 1 1   Feeling bad or failure about yourself  0 0 0 0 0  Trouble concentrating 1 1 1 1 1   Moving slowly or fidgety/restless 0 0 0 0 0  Suicidal thoughts 0 0 0 0 0  PHQ-9 Score 5 5  6  5  4    Difficult doing work/chores Somewhat difficult  Not difficult at all Not difficult at all Not difficult at all     Data saved with a previous flowsheet row definition        09/23/2024    1:53 PM 07/01/2024    3:57 PM 05/05/2024    8:42 AM 03/12/2024    4:03 PM  GAD 7 : Generalized Anxiety Score  Nervous, Anxious, on Edge 1 1 1  0  Control/stop worrying 1 1 1  0  Worry too much - different things 1 1 1 1   Trouble relaxing 2 1 1 1   Restless 2 1 1 1   Easily annoyed or irritable 1 1 2 1   Afraid - awful might happen 1 1 1  0  Total GAD 7 Score 9 7 8 4   Anxiety  Difficulty Somewhat difficult  Not difficult at all Not difficult at all     Review of Systems:   Pertinent items are noted in HPI Denies abnormal vaginal discharge w/ itching/odor/irritation, headaches, visual changes, shortness of breath, chest pain, abdominal pain, severe nausea/vomiting, or problems with urination or bowel movements unless otherwise stated above. Pertinent History Reviewed:  Reviewed past medical,surgical, social, obstetrical and family history.  Reviewed problem list, medications and allergies. Physical Assessment:   Vitals:   11/19/24 1226  BP: 118/78  Pulse: 94  Weight: 244 lb (110.7 kg)  Body mass index is 47.65 kg/m.           Physical Examination:   General appearance: alert, well appearing, and in no distress  Mental status: alert, oriented to person, place, and time  Skin: warm & dry   Extremities: Edema: None    Cardiovascular: normal heart rate noted  Respiratory: normal respiratory effort, no distress  Abdomen: gravid, soft, non-tender  Pelvic: Cervical exam deferred  Fetal Status:     Movement: Present    Fetal Surveillance Testing today: US  31+2 wks,cephalic,anterior placenta gr 1,AFI 15 cm,FHR 139 bpm,EFW 1517 g 11%,AC 15%,FL 3%   Chaperone: N/A  No results found for this or any previous visit (from the past 24 hours).  Assessment & Plan:  High-risk pregnancy: G2P1001 at [redacted]w[redacted]d with an Estimated Date of Delivery: 01/19/25   1) A2DM, EFW today 11% (AC 15%), AFI 15cm. Discussed sugars/meds and today's u/s w/ Dr. Ozan, recommends continuing 500mg  metformin  BID and current schedule of testing beginning next week. If symptomatic/low sugars, let us  know  2) PGBMI 49, currently 47  Meds: No orders of the defined types were placed in this encounter.  Labs/procedures today: U/S  Treatment Plan:   EFW q 4-6w @ 28w   2x/wk NST or weekly bpp @ 32wks     Deliver @ 39-39.6wks:______   Reviewed: Preterm labor symptoms and general obstetric  precautions including but not limited to vaginal bleeding, contractions, leaking of fluid and fetal movement were reviewed in detail with the patient.  All questions were answered. Does have home bp cuff. Office bp cuff given: not applicable. Check bp daily, let us  know if consistently >140 and/or >90, reviewed pre-e sx/reasons to seek care.  Follow-up: Return for As scheduled.   Future Appointments  Date Time Provider Department Center  11/19/2024  4:10 PM Kizzie Suzen SAUN[place]ERS, [place] [facility]  11/26/2024  9:15 AM [facility] US  [facility] None  11/26/2024 10:10 AM Jayne Vonn DEL, MD [facility]  11/30/2024  9:50 AM [facility] NURSE [facility]  12/03/2024 10:00 AM [facility] US  [facility] None  12/03/2024 10:50 AM Kizzie Suzen SAUNDERS, CNM [facility]  12/07/2024 10:10 AM [facility] NURSE [facility]  12/10/2024 10:00 AM [facility] US  [facility] None  12/10/2024 10:50 AM Marilynn Nest, DO [facility]  12/14/2024 10:10 AM [facility] NURSE [facility]  12/17/2024 10:00 AM [facility] US  [facility] None  12/17/2024 10:50 AM Newton Mering, CNM [facility]  12/21/2024 10:10 AM [facility] NURSE [facility]  12/24/2024 10:00 AM [facility] US  [facility] None  12/24/2024 10:50 AM Jayne Vonn DEL, MD [facility]  12/28/2024 10:10 AM [facility] NURSE [facility]  12/31/2024 10:00 AM [facility] US  [facility] None  12/31/2024 10:50 AM Kizzie Suzen SAUNDERS, CNM [facility]  01/04/2025 10:10 AM [facility] NURSE [facility]  01/07/2025 10:00 AM [facility] US  [facility] None  01/07/2025 10:50 AM Kizzie Suzen SAUNDERS, CNM [facility]  01/11/2025  9:50 AM [facility] NURSE [facility]  01/14/2025 10:00 AM [facility] - [facility] US  [facility] None  01/14/2025 10:50 AM Marilynn Nest, DO [facility]  01/18/2025 10:10 AM [facility] NURSE [facility]    No orders of the defined types were placed in this encounter.  Suzen SAUNDERS Kizzie CNM, [facility] 11/19/2024 12:31 PM  "

## 2024-11-19 NOTE — Progress Notes (Signed)
 US  31+2 wks,cephalic,anterior placenta gr 1,AFI 15 cm,FHR 139 bpm,EFW 1517 g 11%,AC 15%,FL 3%

## 2024-11-19 NOTE — Patient Instructions (Signed)
 Nancy Bishop, thank you for choosing our office today! We appreciate the opportunity to meet your healthcare needs. You may receive a short survey by mail, e-mail, or through [employer]. If you are happy with your care we would appreciate if you could take just a few minutes to complete the survey questions. We read all of your comments and take your feedback very seriously. Thank you again for choosing our office.  Center for [employer] Team at [facility]  [facility] ([address] [place], [place] [address]) Entrance C, located off of [address] Free 24/7 valet parking   CLASSES: Go to [email or web address] to register for classes (childbirth, breastfeeding, waterbirth, infant CPR, daddy bootcamp, etc.)  Call the office [phone]) or go to [facility] if: You begin to have strong, frequent contractions Your water breaks.  Sometimes it is a big gush of fluid, sometimes it is just a trickle that keeps getting your panties wet or running down your legs You have vaginal bleeding.  It is normal to have a small amount of spotting if your cervix was checked.  You don't feel your baby moving like normal.  If you don't, get you something to eat and drink and lay down and focus on feeling your baby move.   If your baby is still not moving like normal, you should call the office or go to [facility].  Call the office [phone]) or go to [facility] hospital for these signs of pre-eclampsia: Severe headache that does not go away with Tylenol  Visual changes- seeing spots, double, blurred vision Pain under your right breast or upper abdomen that does not go away with Tums or heartburn medicine Nausea and/or vomiting Severe swelling in your hands, feet, and face   Tdap Vaccine It is recommended that you get the Tdap vaccine during the third trimester of EACH pregnancy to help protect your baby from getting pertussis (whooping cough) 27-36 weeks is the BEST time to do  this so that you can pass the protection on to your baby. During pregnancy is better than after pregnancy, but if you are unable to get it during pregnancy it will be offered at the hospital.  You can get this vaccine with us , at the health department, your family doctor, or some local pharmacies Everyone who will be around your baby should also be up-to-date on their vaccines before the baby comes. Adults (who are not pregnant) only need 1 dose of Tdap during adulthood.   [facility] Pediatricians/Family Doctors [facility]): [address] [place], [phone]           [facility]: [address], [phone]                [facility]): [address], [phone] (call to ask if accepting patients) [facility] Department: [address], [place], [phone]    [facility] Pediatricians/Family Doctors [facility]): [phone] [address], [phone] [facility]: [address] [place], [phone] [facility]: [address], [phone]  [facility] Doctors  [facility]): [phone] [facility]: [address], [phone]   [facility] Doctors [facility]: [address], [phone]  [facility] Family Doctors  [facility]: [phone], [phone]  Home Blood Pressure Monitoring for Patients   Your provider has recommended that you check your  blood pressure (BP) at least once a week at home. If you do not have a blood pressure cuff at home, one will be provided for you. Contact your provider if you have not received your monitor within 1 week.   Helpful Tips for Accurate Home Blood Pressure Checks  Don't smoke, exercise, or drink caffeine 30 minutes before checking your BP Use the restroom before checking your BP (a full bladder can raise your  pressure) Relax in a comfortable upright chair Feet on the ground Left arm resting comfortably on a flat surface at the level of your heart Legs uncrossed Back supported Sit quietly and don't talk Place the cuff on your bare arm Adjust snuggly, so that only two fingertips can fit between your skin and the top of the cuff Check 2 readings separated by at least one minute Keep a log of your BP readings For a visual, please reference this diagram: http://ccnc.care/bpdiagram  Provider Name: [facility]     Phone: [phone]  Zone 1: ALL CLEAR  Continue to monitor your symptoms:  BP reading is less than 140 (top number) or less than 90 (bottom number)  No right upper stomach pain No headaches or seeing spots No feeling nauseated or throwing up No swelling in face and hands  Zone 2: CAUTION Call your doctor's office for any of the following:  BP reading is greater than 140 (top number) or greater than 90 (bottom number)  Stomach pain under your ribs in the middle or right side Headaches or seeing spots Feeling nauseated or throwing up Swelling in face and hands  Zone 3: EMERGENCY  Seek immediate medical care if you have any of the following:  BP reading is greater than160 (top number) or greater than 110 (bottom number) Severe headaches not improving with Tylenol  Serious difficulty catching your breath Any worsening symptoms from Zone 2  Preterm Labor and Birth Information  The normal length of a pregnancy is 39-41 weeks. Preterm labor is when labor starts before 37 completed weeks of pregnancy. What are the risk factors for preterm labor? Preterm labor is more likely to occur in women who: Have certain infections during pregnancy such as a bladder infection, sexually transmitted infection, or infection inside the uterus (chorioamnionitis). Have a shorter-than-normal cervix. Have gone into preterm labor before. Have had surgery on their cervix. Are younger than age 88  or older than age 66. Are African American. Are pregnant with twins or multiple babies (multiple gestation). Take street drugs or smoke while pregnant. Do not gain enough weight while pregnant. Became pregnant shortly after having been pregnant. What are the symptoms of preterm labor? Symptoms of preterm labor include: Cramps similar to those that can happen during a menstrual period. The cramps may happen with diarrhea. Pain in the abdomen or lower back. Regular uterine contractions that may feel like tightening of the abdomen. A feeling of increased pressure in the pelvis. Increased watery or bloody mucus discharge from the vagina. Water breaking (ruptured amniotic sac). Why is it important to recognize signs of preterm labor? It is important to recognize signs of preterm labor because babies who are born prematurely may not be fully developed. This can put them at an increased risk for: Long-term (chronic) heart and lung problems. Difficulty immediately after birth with regulating body systems, including blood sugar, body temperature, heart rate, and breathing rate. Bleeding in the brain. Cerebral palsy. Learning difficulties. Death. These risks are highest for babies who are born before 34 weeks  of pregnancy. How is preterm labor treated? Treatment depends on the length of your pregnancy, your condition, and the health of your baby. It may involve: Having a stitch (suture) placed in your cervix to prevent your cervix from opening too early (cerclage). Taking or being given medicines, such as: Hormone medicines. These may be given early in pregnancy to help support the pregnancy. Medicine to stop contractions. Medicines to help mature the babys lungs. These may be prescribed if the risk of delivery is high. Medicines to prevent your baby from developing cerebral palsy. If the labor happens before 34 weeks of pregnancy, you may need to stay in the hospital. What should I do if I  think I am in preterm labor? If you think that you are going into preterm labor, call your health care provider right away. How can I prevent preterm labor in future pregnancies? To increase your chance of having a full-term pregnancy: Do not use any tobacco products, such as cigarettes, chewing tobacco, and e-cigarettes. If you need help quitting, ask your health care provider. Do not use street drugs or medicines that have not been prescribed to you during your pregnancy. Talk with your health care provider before taking any herbal supplements, even if you have been taking them regularly. Make sure you gain a healthy amount of weight during your pregnancy. Watch for infection. If you think that you might have an infection, get it checked right away. Make sure to tell your health care provider if you have gone into preterm labor before. This information is not intended to replace advice given to you by your health care provider. Make sure you discuss any questions you have with your health care provider. Document Revised: 02/20/2019 Document Reviewed: 03/21/2016 Elsevier Patient Education  2020 [employer].

## 2024-11-24 ENCOUNTER — Telehealth: Payer: Self-pay | Admitting: Obstetrics & Gynecology

## 2024-11-24 NOTE — Telephone Encounter (Signed)
-   Called patient to review her concerns - Discussed that sometimes at night she is concerned that she may be on the verge of being elevated so she may take 2 tablets of metformin  however on 2 occasions she has noted that in the morning her sugar was still elevated 1 time 106 and the other time initially high but repeat 88 - Reassured patient that we are looking for trends and to continue with just metformin  500 twice daily - Based on the snack she was telling me about, anticipate that she could change from cereal and banana to a more protein rich snack and may not see the same elevation in the a.m. - Questions and concerns were addressed and patient agreeable with above  Bricia Taher, DO Attending Obstetrician & Gynecologist, Faculty Practice Center for [employer], [facility]

## 2024-11-26 ENCOUNTER — Ambulatory Visit: Admitting: Obstetrics & Gynecology

## 2024-11-26 ENCOUNTER — Encounter: Payer: Self-pay | Admitting: Obstetrics & Gynecology

## 2024-11-26 ENCOUNTER — Ambulatory Visit

## 2024-11-26 VITALS — BP 117/78 | HR 84 | Wt 246.0 lb

## 2024-11-26 DIAGNOSIS — O0993 Supervision of high risk pregnancy, unspecified, third trimester: Secondary | ICD-10-CM | POA: Diagnosis not present

## 2024-11-26 DIAGNOSIS — Z3A32 32 weeks gestation of pregnancy: Secondary | ICD-10-CM | POA: Diagnosis not present

## 2024-11-26 DIAGNOSIS — O24319 Unspecified pre-existing diabetes mellitus in pregnancy, unspecified trimester: Secondary | ICD-10-CM

## 2024-11-26 DIAGNOSIS — Z6841 Body Mass Index (BMI) 40.0 and over, adult: Secondary | ICD-10-CM

## 2024-11-26 DIAGNOSIS — O24313 Unspecified pre-existing diabetes mellitus in pregnancy, third trimester: Secondary | ICD-10-CM

## 2024-11-26 DIAGNOSIS — Z141 Cystic fibrosis carrier: Secondary | ICD-10-CM

## 2024-11-26 DIAGNOSIS — O0990 Supervision of high risk pregnancy, unspecified, unspecified trimester: Secondary | ICD-10-CM

## 2024-11-26 NOTE — Progress Notes (Signed)
 "   HIGH-RISK PREGNANCY VISIT Patient name: Nancy Bishop MRN [id number]  Date of birth: 10-01-2001 Chief Complaint:   Routine Prenatal Visit  History of Present Illness:   Nancy Bishop is a 24 y.o. G71P1001 female at [redacted]w[redacted]d with an Estimated Date of Delivery: 01/19/25 being seen today for ongoing management of a high-risk pregnancy complicated by     ICD-10-CM   1. Encounter for supervision of high risk pregnancy in third trimester, antepartum  O09.93     2. Class B- MTF 500/1000mg , fastings a smidge high, but EFW 11%  O24.319     3. BMI 45.0-49.9, adult (HCC)  Z68.42      .    Today she reports no complaints. Contractions: Not present. Vag. Bleeding: None.  Movement: Present. denies leaking of fluid.      09/23/2024    1:53 PM 07/01/2024    3:57 PM 05/05/2024    8:42 AM 03/12/2024    4:03 PM 01/29/2024    1:13 PM  Depression screen PHQ 2/9  Decreased Interest 1 1 1  0 0  Down, Depressed, Hopeless 0 0 1 0 0  PHQ - 2 Score 1 1 2  0 0  Altered sleeping 1 1 1 2 1   Tired, decreased energy 1 1 1 1 1   Change in appetite 1 1 1 1 1   Feeling bad or failure about yourself  0 0 0 0 0  Trouble concentrating 1 1 1 1 1   Moving slowly or fidgety/restless 0 0 0 0 0  Suicidal thoughts 0 0 0 0 0  PHQ-9 Score 5 5  6  5  4    Difficult doing work/chores Somewhat difficult  Not difficult at all Not difficult at all Not difficult at all     Data saved with a previous flowsheet row definition        09/23/2024    1:53 PM 07/01/2024    3:57 PM 05/05/2024    8:42 AM 03/12/2024    4:03 PM  GAD 7 : Generalized Anxiety Score  Nervous, Anxious, on Edge 1 1 1  0  Control/stop worrying 1 1 1  0  Worry too much - different things 1 1 1 1   Trouble relaxing 2 1 1 1   Restless 2 1 1 1   Easily annoyed or irritable 1 1 2 1   Afraid - awful might happen 1 1 1  0  Total GAD 7 Score 9 7 8 4   Anxiety Difficulty Somewhat difficult  Not difficult at all Not difficult at all     Review of Systems:    Pertinent items are noted in HPI Denies abnormal vaginal discharge w/ itching/odor/irritation, headaches, visual changes, shortness of breath, chest pain, abdominal pain, severe nausea/vomiting, or problems with urination or bowel movements unless otherwise stated above. Pertinent History Reviewed:  Reviewed past medical,surgical, social, obstetrical and family history.  Reviewed problem list, medications and allergies. Physical Assessment:   Vitals:   11/26/24 1026  BP: 117/78  Pulse: 84  Weight: 246 lb (111.6 kg)  Body mass index is 48.04 kg/m.           Physical Examination:   General appearance: alert, well appearing, and in no distress  Mental status: alert, oriented to person, place, and time  Skin: warm & dry   Extremities:      Cardiovascular: normal heart rate noted  Respiratory: normal respiratory effort, no distress  Abdomen: gravid, soft, non-tender  Pelvic: Cervical exam deferred  Fetal Status:     Movement: Present    Fetal Surveillance Testing today: BPP 8/8   Chaperone: N/A    No results found for this or any previous visit (from the past 24 hours).  Assessment & Plan:  High-risk pregnancy: G2P1001 at [redacted]w[redacted]d with an Estimated Date of Delivery: 01/19/25      ICD-10-CM   1. Encounter for supervision of high risk pregnancy in third trimester, antepartum  O09.93     2. Class B- MTF 500/1000mg , fastings a smidge high, but EFW 11%  O24.319     3. BMI 45.0-49.9, adult (HCC)  Z68.42          Meds: No orders of the defined types were placed in this encounter.   Orders: No orders of the defined types were placed in this encounter.    Labs/procedures today: U/S  Treatment Plan:  continue current surveillance schedule    Follow-up: Return for keep scheduled.   Future Appointments  Date Time Provider Department Center  11/30/2024  9:50 AM [facility] NURSE [facility]  12/03/2024 10:00 AM [facility] US  [facility] None  12/03/2024 10:50 AM  Kizzie Suzen SAUNDERS, CNM [facility]  12/07/2024 10:10 AM [facility] NURSE [facility]  12/10/2024 10:00 AM [facility] US  [facility] None  12/10/2024 10:50 AM Ozan, Jennifer, DO [facility]  12/14/2024 10:10 AM [facility] NURSE [facility]  12/17/2024 10:00 AM [facility] US  [facility] None  12/17/2024 10:50 AM Newton Mering, CNM [facility]  12/21/2024 10:10 AM [facility] NURSE [facility]  12/24/2024 10:00 AM [facility] US  [facility] None  12/24/2024 10:50 AM Jayne Vonn DEL, MD [facility]  12/28/2024 10:10 AM [facility] NURSE [facility]  12/31/2024 10:00 AM [facility] US  [facility] None  12/31/2024 10:50 AM Kizzie Suzen SAUNDERS, CNM [facility]  01/04/2025 10:10 AM [facility] NURSE [facility]  01/07/2025 10:00 AM [facility] US  [facility] None  01/07/2025 11:10 AM Newton Mering, CNM [facility]  01/11/2025  9:50 AM [facility] NURSE [facility]  01/14/2025 10:00 AM [facility] US  [facility] None  01/14/2025 10:50 AM Marilynn Nest, DO [facility]  01/18/2025 10:10 AM [facility] NURSE [facility]    No orders of the defined types were placed in this encounter.  Vonn DEL Jayne  Attending Physician for the [facility] 11/26/2024 11:01 AM  "

## 2024-11-26 NOTE — Progress Notes (Signed)
 US  32+2 wks,cephalic,BPP 8/8,thick anterior placenta 5.2 cm gr 2,AFI 14 cm,FHR 148 BPM

## 2024-11-30 ENCOUNTER — Ambulatory Visit: Admitting: *Deleted

## 2024-11-30 VITALS — BP 118/77 | HR 86

## 2024-11-30 DIAGNOSIS — O24419 Gestational diabetes mellitus in pregnancy, unspecified control: Secondary | ICD-10-CM | POA: Diagnosis not present

## 2024-11-30 DIAGNOSIS — Z3A32 32 weeks gestation of pregnancy: Secondary | ICD-10-CM

## 2024-11-30 DIAGNOSIS — O99213 Obesity complicating pregnancy, third trimester: Secondary | ICD-10-CM | POA: Diagnosis not present

## 2024-11-30 DIAGNOSIS — O0993 Supervision of high risk pregnancy, unspecified, third trimester: Secondary | ICD-10-CM

## 2024-11-30 NOTE — Progress Notes (Signed)
" ° °  NURSE VISIT- NST  SUBJECTIVE:  Nancy Bishop is a 24 y.o. 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  She reports active fetal movement, contractions: none, vaginal bleeding: none, membranes: intact.   OBJECTIVE:  BP 118/77   Pulse 86   LMP 04/14/2024 (Exact Date)   Appears well, no apparent distress  No results found for this or any previous visit (from the past 24 hours).  NST: FHR baseline 150 bpm, Variability: moderate, Accelerations:present, Decelerations:  Absent= Cat 1/reactive Toco: none   ASSESSMENT: G2P1001 at [redacted]w[redacted]d with Diabetes: A2/BDM NST reactive  PLAN: EFM strip reviewed by Dr. Jayne   Recommendations: keep next appointment as scheduled    Alan LITTIE Fischer  11/30/2024 10:26 AM  "

## 2024-12-01 ENCOUNTER — Encounter (HOSPITAL_COMMUNITY): Payer: Self-pay | Admitting: Obstetrics and Gynecology

## 2024-12-01 ENCOUNTER — Inpatient Hospital Stay (HOSPITAL_COMMUNITY)
Admission: AD | Admit: 2024-12-01 | Discharge: 2024-12-01 | Disposition: A | Discharge status: Home / Self Care | Attending: Obstetrics and Gynecology | Admitting: Obstetrics and Gynecology

## 2024-12-01 DIAGNOSIS — Z3689 Encounter for other specified antenatal screening: Secondary | ICD-10-CM

## 2024-12-01 DIAGNOSIS — Z91040 Latex allergy status: Secondary | ICD-10-CM | POA: Insufficient documentation

## 2024-12-01 DIAGNOSIS — Z3493 Encounter for supervision of normal pregnancy, unspecified, third trimester: Secondary | ICD-10-CM

## 2024-12-01 DIAGNOSIS — O99213 Obesity complicating pregnancy, third trimester: Secondary | ICD-10-CM | POA: Diagnosis not present

## 2024-12-01 DIAGNOSIS — O24415 Gestational diabetes mellitus in pregnancy, controlled by oral hypoglycemic drugs: Secondary | ICD-10-CM | POA: Diagnosis not present

## 2024-12-01 DIAGNOSIS — O4703 False labor before 37 completed weeks of gestation, third trimester: Secondary | ICD-10-CM | POA: Diagnosis present

## 2024-12-01 DIAGNOSIS — Z3A33 33 weeks gestation of pregnancy: Secondary | ICD-10-CM | POA: Diagnosis not present

## 2024-12-01 DIAGNOSIS — Z91048 Other nonmedicinal substance allergy status: Secondary | ICD-10-CM | POA: Insufficient documentation

## 2024-12-01 LAB — URINALYSIS, ROUTINE W REFLEX MICROSCOPIC
Bacteria, UA: NONE SEEN
Bilirubin Urine: NEGATIVE
Glucose, UA: NEGATIVE mg/dL
Hgb urine dipstick: NEGATIVE
Ketones, ur: 20 mg/dL — AB
Nitrite: NEGATIVE
Protein, ur: NEGATIVE mg/dL
Specific Gravity, Urine: 1.012 (ref 1.005–1.030)
pH: 5 (ref 5.0–8.0)

## 2024-12-01 NOTE — MAU Provider Note (Signed)
 Chief Complaint:  Decreased Fetal Movement   HPI   None     Nancy Bishop is a 24 y.o. G2P1001 at [redacted]w[redacted]d who presents to maternity admissions reporting decreased fetal movement. She reports she has not felt fetal movement since yesterday. She had an NST in the office yesterday which was reactive. She notes intermittent contractions for several weeks, denies vaginal bleeding, leaking of fluid.  Pregnancy Course: Receives care at [facility] for [facility] . Prenatal records reviewed. Pregnancy complicated by gestational DM, BMI >40, CF carrier. Anterior placenta.  Past Medical History:  Diagnosis Date   Asthma    Fibromyalgia    Hypermobility syndrome 10/22/2016   Numbness and tingling    OB History  Gravida Para Term Preterm AB Living  2 1 1  0 0 1  SAB IAB Ectopic Multiple Live Births  0 0 0  1    # Outcome Date GA Lbr Len/2nd Weight Sex Type Anes PTL Lv  2 Current           1 Term 03/10/22 [redacted]w[redacted]d 14:42 / 02:10 3150 g F Vag-Spont EPI  LIV   Past Surgical History:  Procedure Laterality Date   WISDOM TOOTH EXTRACTION  02/2018   WISDOM TOOTH EXTRACTION     Family History  Problem Relation Age of Onset   Diabetes Father    Other Father    Healthy Brother    Diabetes Maternal Grandmother    Cervical cancer Maternal Grandmother    Hypertension Paternal Grandmother    Diabetes Paternal Grandmother    Lung cancer Paternal Grandmother    Heart failure Paternal Grandmother    Asthma Paternal Grandfather    COPD Paternal Grandfather    Dementia Paternal Grandfather    Alzheimer's disease Paternal Grandfather    Social History[1] Allergies[2] Medications Prior to Admission  Medication Sig Dispense Refill Last Dose/Taking   aspirin  81 MG chewable tablet Chew 2 tablets (162 mg total) by mouth daily. 60 tablet 7 12/01/2024   metFORMIN  (GLUCOPHAGE ) 500 MG tablet Take 500mg  (1 tablet) in the morning and 1,000mg  (2 tablets) at bedtime 90 tablet 6 11/30/2024    Prenatal Vit-Fe Fumarate-FA (PRENATAL VITAMIN PO) Take by mouth.   12/01/2024   Accu-Chek Softclix Lancets lancets Check blood sugar four times daily 100 each 12    Blood Glucose Monitoring Suppl (ACCU-CHEK GUIDE ME) w/Device KIT 1 kit by Does not apply route as directed. Check blood sugar four times daily 1 kit 0    Continuous Glucose Sensor (DEXCOM G7 SENSOR) MISC USE TO MONITOR BLOOD SUGAR CONTINUOUSLY. CHANGE SENSOR EVERY 10 DAYS. 3 each 6    cyclobenzaprine  (FLEXERIL ) 10 MG tablet Take 1 tablet (10 mg total) by mouth every 8 (eight) hours as needed for muscle spasms. (Patient not taking: Reported on 12/01/2024) 30 tablet 1 Not Taking   glucose blood (ACCU-CHEK GUIDE TEST) test strip Check blood sugar four time daily 100 each 12    NON FORMULARY Beef organ daily      triamcinolone  ointment (KENALOG ) 0.5 % Apply 1 Application topically 2 (two) times daily as needed. 30 g 1     I have reviewed patient's Past Medical Hx, Surgical Hx, Family Hx, Social Hx, medications and allergies.   ROS  Pertinent items noted in HPI and remainder of comprehensive ROS otherwise negative.   PHYSICAL EXAM  Patient Vitals for the past 24 hrs:  BP Temp Pulse Resp SpO2 Height Weight  12/01/24 1958 125/78 -- -- -- -- -- --  12/01/24 1957 -- 98 F (36.7 C) -- 17 -- -- --  12/01/24 1954 -- -- 81 -- 100 % 5' (1.524 m) 112.8 kg    Constitutional: Well-developed, well-nourished female in no acute distress.  Cardiovascular: normal rate, warm and well-perfused Respiratory: normal effort, no problems with respiration noted GI: Abd soft, non-tender, non-distended MSK: Extremities nontender, no edema, normal ROM Skin: warm and dry. Acyanotic, no jaundice or pallor. Neurologic: Alert and oriented x 4. No abnormal coordination. Psychiatric: Normal mood. Speech not slurred, not rapid/pressured. Patient is cooperative. Pelvic exam: exam chaperoned by [facility] RN.  Dilation: Closed Effacement (%):  Thick Cervical Position: Posterior Station: -4 (Ballotable) Exam by:: Kortlynn Poust, PA  Fetal Tracing: Baseline FHR: 130 per minute Fetal heart variability: moderate Fetal Heart Rate accelerations: yes Fetal Heart Rate decelerations: none Fetal Non-stress Test: Category I (reactive) Toco: irregular uterine contractions   Labs: Results for orders placed or performed during the hospital encounter of 12/01/24 (from the past 24 hours)  Urinalysis, Routine w reflex microscopic -Urine, Clean Catch     Status: Abnormal   Collection Time: 12/01/24  8:15 PM  Result Value Ref Range   Color, Urine YELLOW YELLOW   APPearance CLEAR CLEAR   Specific Gravity, Urine 1.012 1.005 - 1.030   pH 5.0 5.0 - 8.0   Glucose, UA NEGATIVE NEGATIVE mg/dL   Hgb urine dipstick NEGATIVE NEGATIVE   Bilirubin Urine NEGATIVE NEGATIVE   Ketones, ur 20 (A) NEGATIVE mg/dL   Protein, ur NEGATIVE NEGATIVE mg/dL   Nitrite NEGATIVE NEGATIVE   Leukocytes,Ua TRACE (A) NEGATIVE   RBC / HPF 0-5 0 - 5 RBC/hpf   WBC, UA 0-5 0 - 5 WBC/hpf   Bacteria, UA NONE SEEN NONE SEEN   Squamous Epithelial / HPF 0-5 0 - 5 /HPF   Mucus PRESENT     Imaging:  No results found.  MDM & MAU COURSE  MDM: Moderate  MAU Course: -Vital signs within normal limits.  -UA with trace leukocytes but negative nitrites and bacteria, no UTI. -NST reactive. -Patient endorses feeling fetal movement in MAU, reports movement is back to normal amount. -Fetal movement present on bedside US  for additional maternal reassurance. -No cervical dilation on exam.  Differential diagnosis considered for decreased fetal movement includes but is not limited to: fetal sleep, poor maternal perception of movement, medications, early gestational age, decreased/increased amniotic fluid volume, maternal position (sitting or standing versus lying), fetal position (anterior position of the fetal spine), anterior placenta, maternal physical activity   Orders Placed This  Encounter  Procedures   Urinalysis, Routine w reflex microscopic -Urine, Clean Catch   Discharge patient   No orders of the defined types were placed in this encounter.   ASSESSMENT   1. NST (non-stress test) reactive   2. Movement of fetus present during pregnancy in third trimester   3. [redacted] weeks gestation of pregnancy     PLAN  Discharge home in stable condition with return precautions.  Follow up with OB on 12/03/2024 as scheduled.   Follow-up Information     [facility] for [facility] at [facility] Follow up.   Specialty: Obstetrics and Gynecology Why: As scheduled for ongoing prenatal care Contact information: [address] [place]  [address] [phone]                 Allergies as of 12/01/2024       Reactions   Wasp Venom Swelling   Bee Venom Hives  8-10 inch raised area   Latex Swelling   Latex Itching   Tape Itching   Please use paper tape        Medication List     TAKE these medications    Accu-Chek Guide Me w/Device Kit 1 kit by Does not apply route as directed. Check blood sugar four times daily   Accu-Chek Guide Test test strip Generic drug: glucose blood Check blood sugar four time daily   Accu-Chek Softclix Lancets lancets Check blood sugar four times daily   aspirin  81 MG chewable tablet Chew 2 tablets (162 mg total) by mouth daily.   cyclobenzaprine  10 MG tablet Commonly known as: FLEXERIL  Take 1 tablet (10 mg total) by mouth every 8 (eight) hours as needed for muscle spasms.   Dexcom G7 Sensor Misc USE TO MONITOR BLOOD SUGAR CONTINUOUSLY. CHANGE SENSOR EVERY 10 DAYS.   metFORMIN  500 MG tablet Commonly known as: GLUCOPHAGE  Take 500mg  (1 tablet) in the morning and 1,000mg  (2 tablets) at bedtime   NON FORMULARY Beef organ daily   PRENATAL VITAMIN PO Take by mouth.   triamcinolone  ointment 0.5 % Commonly known as: KENALOG  Apply 1 Application topically 2 (two) times daily as  needed.        Joesph DELENA Sear, PA     [1]  Social History Tobacco Use   Smoking status: Never   Smokeless tobacco: Never  Vaping Use   Vaping status: Never Used  Substance Use Topics   Alcohol use: Not Currently    Comment: occas   Drug use: Never  [2]  Allergies Allergen Reactions   Wasp Venom Swelling   Bee Venom Hives    8-10 inch raised area   Latex Swelling   Latex Itching   Tape Itching    Please use paper tape

## 2024-12-01 NOTE — Discharge Instructions (Signed)
 Reasons to return to MAU at [facility]: Less than 36 weeks: Contractions feels like menstrual cramps. You should go to the hospital if you have more than 6 contractions in an hour, even after you have rested and drank at least 16 ounces of water.  More than 36 weeks: You begin to have strong, frequent contractions 5 minutes apart or less, each last 1 minute, these have been going on for 1-2 hours, and you cannot walk or talk during them. Your water breaks.  Sometimes it is a big gush of fluid. However, many times it may it may be much more subtle. You should go to the hospital if you have a constant leakage of fluid from your vagina, enough to soak a pad when you are walking around.  You have vaginal bleeding.  It is normal to have a small amount of spotting if your cervix was checked. If you have bleeding requiring the use of a pad, go to the hospital. You don't feel your baby moving like normal.  If you think that you baby's movement is decreased, eat a snack and rest on your left side in a quiet room for one hour. If you have not felt the baby move more than 6 times in an hour GO TO THE HOSPITAL.     What is a fetal movement count?  A fetal movement count  is the number of times that you feel your baby move during a certain amount of time. This may also be called a fetal kick count by some people, but fetal movement count is a more accurate term. Movements can be kicks, flutters, swishes, rolls, or jabs. A fetal movement count is recommended for every pregnant woman. You may be asked to start counting fetal movements as early as week 28 of your pregnancy. Pay attention to when your baby is most active. Your baby has times of activity and times to sleep just like you do! You may notice your baby's sleep and wake cycles. You may also notice things that make your baby move more. You should do a fetal movement count: When your baby is normally most active. At the same time  each day. A good time to count movements is while you are resting, after having something to eat and drink.  How do I count fetal movements? Find a quiet, comfortable area. Sit, or lie down on your side. Write down the date, the start time and stop time, and the number of movements that you felt between those two times. Take this information with you to your health care visits. Write down your start time when you feel the first movement. Count kicks, flutters, swishes, rolls, and jabs. You should feel at least 10 movements. You may stop counting after you have felt 10 movements, or if you have been counting for 2 hours. Write down the stop time. If you do not feel 10 movements in 2 hours, contact your health care provider for further instructions. Your health care provider may want to do additional tests to assess your baby's well-being. Contact a health care provider if: You feel fewer than 10 movements in 2 hours. Your baby is not moving like he or she usually does.

## 2024-12-01 NOTE — MAU Note (Signed)
 Nancy Bishop is a 24 y.o. at [redacted]w[redacted]d here in MAU reporting not feeling FM since yesterday. Reports having an NST yest at the office.  Reports having intermittent contractions for weeks and that is mostly what she feels. Has anterior placenta. Contractions just feel tight  LMP: na Onset of complaint: since yesterday Pain score: 0 Vitals:   12/01/24 1957 12/01/24 1958  BP:  125/78  Pulse:    Resp: 17   Temp: 98 F (36.7 C)   SpO2:       FHT: 140  Lab orders placed from triage: u/a

## 2024-12-02 ENCOUNTER — Encounter: Payer: Self-pay | Admitting: *Deleted

## 2024-12-03 ENCOUNTER — Ambulatory Visit: Admitting: Women's Health

## 2024-12-03 ENCOUNTER — Encounter: Payer: Self-pay | Admitting: Women's Health

## 2024-12-03 ENCOUNTER — Ambulatory Visit (INDEPENDENT_AMBULATORY_CARE_PROVIDER_SITE_OTHER)

## 2024-12-03 VITALS — BP 121/80 | HR 93 | Wt 247.0 lb

## 2024-12-03 DIAGNOSIS — Z3A33 33 weeks gestation of pregnancy: Secondary | ICD-10-CM | POA: Diagnosis not present

## 2024-12-03 DIAGNOSIS — Z141 Cystic fibrosis carrier: Secondary | ICD-10-CM

## 2024-12-03 DIAGNOSIS — O24415 Gestational diabetes mellitus in pregnancy, controlled by oral hypoglycemic drugs: Secondary | ICD-10-CM

## 2024-12-03 DIAGNOSIS — O3123X0 Continuing pregnancy after intrauterine death of one fetus or more, third trimester, not applicable or unspecified: Secondary | ICD-10-CM

## 2024-12-03 DIAGNOSIS — O24319 Unspecified pre-existing diabetes mellitus in pregnancy, unspecified trimester: Secondary | ICD-10-CM

## 2024-12-03 DIAGNOSIS — O0990 Supervision of high risk pregnancy, unspecified, unspecified trimester: Secondary | ICD-10-CM

## 2024-12-03 DIAGNOSIS — O0993 Supervision of high risk pregnancy, unspecified, third trimester: Secondary | ICD-10-CM

## 2024-12-03 LAB — POCT URINALYSIS DIPSTICK OB
Glucose, UA: NEGATIVE
Ketones, UA: NEGATIVE
Nitrite, UA: NEGATIVE
POC,PROTEIN,UA: NEGATIVE

## 2024-12-03 NOTE — Progress Notes (Signed)
 US  33+2 wks,cephalic,BPP 8/8,FHR 148 bpm,thick anterior placenta gr 2,AFI 14 cm

## 2024-12-03 NOTE — Progress Notes (Signed)
 "  HIGH-RISK PREGNANCY VISIT Patient name: Nancy Bishop MRN [id number]  Date of birth: 2001/02/18 Chief Complaint:   Routine Prenatal Visit  History of Present Illness:   Nancy Bishop is a 24 y.o. G5P1001 female at [redacted]w[redacted]d with an Estimated Date of Delivery: 01/19/25 being seen today for ongoing management of a high-risk pregnancy complicated by diabetes mellitus A2DM currently on metformin  500AM/1000PM, PGBMI 49.    Today she brought log, fastings 88-100 (only a few ^), all 2hr pp wnl. Contractions: Not present. Vag. Bleeding: None.  Movement: Present. denies leaking of fluid.      09/23/2024    1:53 PM 07/01/2024    3:57 PM 05/05/2024    8:42 AM 03/12/2024    4:03 PM 01/29/2024    1:13 PM  Depression screen PHQ 2/9  Decreased Interest 1 1 1  0 0  Down, Depressed, Hopeless 0 0 1 0 0  PHQ - 2 Score 1 1 2  0 0  Altered sleeping 1 1 1 2 1   Tired, decreased energy 1 1 1 1 1   Change in appetite 1 1 1 1 1   Feeling bad or failure about yourself  0 0 0 0 0  Trouble concentrating 1 1 1 1 1   Moving slowly or fidgety/restless 0 0 0 0 0  Suicidal thoughts 0 0 0 0 0  PHQ-9 Score 5 5  6  5  4    Difficult doing work/chores Somewhat difficult  Not difficult at all Not difficult at all Not difficult at all     Data saved with a previous flowsheet row definition        09/23/2024    1:53 PM 07/01/2024    3:57 PM 05/05/2024    8:42 AM 03/12/2024    4:03 PM  GAD 7 : Generalized Anxiety Score  Nervous, Anxious, on Edge 1  1  1   0   Control/stop worrying 1  1  1   0   Worry too much - different things 1  1  1  1    Trouble relaxing 2  1  1  1    Restless 2  1  1  1    Easily annoyed or irritable 1  1  2  1    Afraid - awful might happen 1  1  1   0   Total GAD 7 Score 9 7 8 4   Anxiety Difficulty Somewhat difficult  Not difficult at all Not difficult at all     Data saved with a previous flowsheet row definition     Review of Systems:   Pertinent items are noted in HPI Denies abnormal vaginal  discharge w/ itching/odor/irritation, headaches, visual changes, shortness of breath, chest pain, abdominal pain, severe nausea/vomiting, or problems with urination or bowel movements unless otherwise stated above. Pertinent History Reviewed:  Reviewed past medical,surgical, social, obstetrical and family history.  Reviewed problem list, medications and allergies. Physical Assessment:   Vitals:   12/03/24 1045  BP: 121/80  Pulse: 93  Weight: 247 lb (112 kg)  Body mass index is 48.24 kg/m.           Physical Examination:   General appearance: alert, well appearing, and in no distress  Mental status: alert, oriented to person, place, and time  Skin: warm & dry   Extremities:      Cardiovascular: normal heart rate noted  Respiratory: normal respiratory effort, no distress  Abdomen: gravid, soft, non-tender  Pelvic: Cervical exam deferred  Fetal Status:     Movement: Present    Fetal Surveillance Testing today: US  33+2 wks,cephalic,BPP 8/8,FHR 148 bpm,thick anterior placenta gr 2,AFI 14 cm   Chaperone: N/A  Results for orders placed or performed in visit on 12/03/24 (from the past 24 hours)  POC Urinalysis Dipstick OB   Collection Time: 12/03/24 10:52 AM  Result Value Ref Range   Color, UA     Clarity, UA     Glucose, UA Negative Negative   Bilirubin, UA     Ketones, UA Neg    Spec Grav, UA     Blood, UA Trace    pH, UA     POC,PROTEIN,UA Negative Negative, Trace, Small (1+), Moderate (2+), Large (3+), 4+   Urobilinogen, UA     Nitrite, UA Neg    Leukocytes, UA Trace (A) Negative   Appearance     Odor      Assessment & Plan:  High-risk pregnancy: G2P1001 at [redacted]w[redacted]d with an Estimated Date of Delivery: 01/19/25   1) A2/BDM, 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   Labs/procedures today: U/S  Treatment Plan: EFW q 4w    2x/wk NST or  weekly bpp      Deliver @ 39-39.6wks:______   Reviewed: Preterm labor symptoms and general obstetric precautions including but not limited to vaginal bleeding, contractions, leaking of fluid and fetal movement were reviewed in detail with the patient.  All questions were answered. Does have home bp cuff. Office bp cuff given: not applicable. Check bp daily, let us  know if consistently >140 and/or >90.  Follow-up: Return for As scheduled.   Future Appointments  Date Time Provider Department Center  12/07/2024 10:10 AM [facility] NURSE [facility]  12/10/2024 10:00 AM [facility] US  [facility] None  12/10/2024 10:50 AM Marilynn Nest, DO [facility]  12/14/2024 10:10 AM [facility] NURSE [facility]  12/17/2024 10:00 AM [facility] US  [facility] None  12/17/2024 10:50 AM Cresenzo-Dishmon, Cathlean, CNM [facility]  12/21/2024 10:10 AM [facility] NURSE [facility]  12/24/2024 10:00 AM [facility] US  [facility] None  12/24/2024 10:50 AM Jayne Vonn DEL, MD [facility]  12/28/2024 10:10 AM [facility] NURSE [facility]  12/31/2024 10:00 AM [facility] US  [facility] None  12/31/2024 10:50 AM Kizzie Suzen SAUNDERS, CNM [facility]  01/04/2025 10:10 AM [facility] NURSE [facility]  01/07/2025 10:00 AM [facility] US  [facility] None  01/07/2025 11:10 AM Newton Cathlean, CNM [facility]  01/11/2025  9:50 AM [facility] NURSE [facility]  01/14/2025 10:00 AM [facility] US  [facility] None  01/14/2025 10:50 AM Marilynn Nest, DO [facility]  01/18/2025 10:10 AM [facility] NURSE [facility]    Orders Placed This Encounter  Procedures   POC Urinalysis Dipstick OB   Suzen SAUNDERS Kizzie CNM, [facility] 12/03/2024 11:17 AM  "

## 2024-12-03 NOTE — Patient Instructions (Signed)
 Nancy Bishop, thank you for choosing our office today! We appreciate the opportunity to meet your healthcare needs. You may receive a short survey by mail, e-mail, or through [employer]. If you are happy with your care we would appreciate if you could take just a few minutes to complete the survey questions. We read all of your comments and take your feedback very seriously. Thank you again for choosing our office.  Center for [employer] Team at [facility]  [facility] ([address] [place], [place] [address]) Entrance C, located off of [address] Free 24/7 valet parking   CLASSES: Go to [email or web address] to register for classes (childbirth, breastfeeding, waterbirth, infant CPR, daddy bootcamp, etc.)  Call the office [phone]) or go to [facility] if: You begin to have strong, frequent contractions Your water breaks.  Sometimes it is a big gush of fluid, sometimes it is just a trickle that keeps getting your panties wet or running down your legs You have vaginal bleeding.  It is normal to have a small amount of spotting if your cervix was checked.  You don't feel your baby moving like normal.  If you don't, get you something to eat and drink and lay down and focus on feeling your baby move.   If your baby is still not moving like normal, you should call the office or go to [facility].  Call the office [phone]) or go to [facility] hospital for these signs of pre-eclampsia: Severe headache that does not go away with Tylenol  Visual changes- seeing spots, double, blurred vision Pain under your right breast or upper abdomen that does not go away with Tums or heartburn medicine Nausea and/or vomiting Severe swelling in your hands, feet, and face   Tdap Vaccine It is recommended that you get the Tdap vaccine during the third trimester of EACH pregnancy to help protect your baby from getting pertussis (whooping cough) 27-36 weeks is the BEST time to do  this so that you can pass the protection on to your baby. During pregnancy is better than after pregnancy, but if you are unable to get it during pregnancy it will be offered at the hospital.  You can get this vaccine with us , at the health department, your family doctor, or some local pharmacies Everyone who will be around your baby should also be up-to-date on their vaccines before the baby comes. Adults (who are not pregnant) only need 1 dose of Tdap during adulthood.   [facility] Pediatricians/Family Doctors [facility]): [address] [place], [phone]           [facility]: [address], [phone]                [facility]): [address], [phone] (call to ask if accepting patients) [facility] Department: [address], [place], [phone]    [facility] Pediatricians/Family Doctors [facility]): [phone] [address], [phone] [facility]: [address] [place], [phone] [facility]: [address], [phone]  [facility] Doctors  [facility]): [phone] [facility]: [address], [phone]   [facility] Doctors [facility]: [address], [phone]  [facility] Family Doctors  [facility]: [phone], [phone]  Home Blood Pressure Monitoring for Patients   Your provider has recommended that you check your  blood pressure (BP) at least once a week at home. If you do not have a blood pressure cuff at home, one will be provided for you. Contact your provider if you have not received your monitor within 1 week.   Helpful Tips for Accurate Home Blood Pressure Checks  Don't smoke, exercise, or drink caffeine 30 minutes before checking your BP Use the restroom before checking your BP (a full bladder can raise your  pressure) Relax in a comfortable upright chair Feet on the ground Left arm resting comfortably on a flat surface at the level of your heart Legs uncrossed Back supported Sit quietly and don't talk Place the cuff on your bare arm Adjust snuggly, so that only two fingertips can fit between your skin and the top of the cuff Check 2 readings separated by at least one minute Keep a log of your BP readings For a visual, please reference this diagram: http://ccnc.care/bpdiagram  Provider Name: [facility]     Phone: [phone]  Zone 1: ALL CLEAR  Continue to monitor your symptoms:  BP reading is less than 140 (top number) or less than 90 (bottom number)  No right upper stomach pain No headaches or seeing spots No feeling nauseated or throwing up No swelling in face and hands  Zone 2: CAUTION Call your doctor's office for any of the following:  BP reading is greater than 140 (top number) or greater than 90 (bottom number)  Stomach pain under your ribs in the middle or right side Headaches or seeing spots Feeling nauseated or throwing up Swelling in face and hands  Zone 3: EMERGENCY  Seek immediate medical care if you have any of the following:  BP reading is greater than160 (top number) or greater than 110 (bottom number) Severe headaches not improving with Tylenol  Serious difficulty catching your breath Any worsening symptoms from Zone 2  Preterm Labor and Birth Information  The normal length of a pregnancy is 39-41 weeks. Preterm labor is when labor starts before 37 completed weeks of pregnancy. What are the risk factors for preterm labor? Preterm labor is more likely to occur in women who: Have certain infections during pregnancy such as a bladder infection, sexually transmitted infection, or infection inside the uterus (chorioamnionitis). Have a shorter-than-normal cervix. Have gone into preterm labor before. Have had surgery on their cervix. Are younger than age 88  or older than age 66. Are African American. Are pregnant with twins or multiple babies (multiple gestation). Take street drugs or smoke while pregnant. Do not gain enough weight while pregnant. Became pregnant shortly after having been pregnant. What are the symptoms of preterm labor? Symptoms of preterm labor include: Cramps similar to those that can happen during a menstrual period. The cramps may happen with diarrhea. Pain in the abdomen or lower back. Regular uterine contractions that may feel like tightening of the abdomen. A feeling of increased pressure in the pelvis. Increased watery or bloody mucus discharge from the vagina. Water breaking (ruptured amniotic sac). Why is it important to recognize signs of preterm labor? It is important to recognize signs of preterm labor because babies who are born prematurely may not be fully developed. This can put them at an increased risk for: Long-term (chronic) heart and lung problems. Difficulty immediately after birth with regulating body systems, including blood sugar, body temperature, heart rate, and breathing rate. Bleeding in the brain. Cerebral palsy. Learning difficulties. Death. These risks are highest for babies who are born before 34 weeks  of pregnancy. How is preterm labor treated? Treatment depends on the length of your pregnancy, your condition, and the health of your baby. It may involve: Having a stitch (suture) placed in your cervix to prevent your cervix from opening too early (cerclage). Taking or being given medicines, such as: Hormone medicines. These may be given early in pregnancy to help support the pregnancy. Medicine to stop contractions. Medicines to help mature the babys lungs. These may be prescribed if the risk of delivery is high. Medicines to prevent your baby from developing cerebral palsy. If the labor happens before 34 weeks of pregnancy, you may need to stay in the hospital. What should I do if I  think I am in preterm labor? If you think that you are going into preterm labor, call your health care provider right away. How can I prevent preterm labor in future pregnancies? To increase your chance of having a full-term pregnancy: Do not use any tobacco products, such as cigarettes, chewing tobacco, and e-cigarettes. If you need help quitting, ask your health care provider. Do not use street drugs or medicines that have not been prescribed to you during your pregnancy. Talk with your health care provider before taking any herbal supplements, even if you have been taking them regularly. Make sure you gain a healthy amount of weight during your pregnancy. Watch for infection. If you think that you might have an infection, get it checked right away. Make sure to tell your health care provider if you have gone into preterm labor before. This information is not intended to replace advice given to you by your health care provider. Make sure you discuss any questions you have with your health care provider. Document Revised: 02/20/2019 Document Reviewed: 03/21/2016 Elsevier Patient Education  2020 [employer].

## 2024-12-04 ENCOUNTER — Encounter: Payer: Self-pay | Admitting: *Deleted

## 2024-12-07 ENCOUNTER — Encounter: Payer: Self-pay | Admitting: *Deleted

## 2024-12-07 ENCOUNTER — Other Ambulatory Visit

## 2024-12-08 ENCOUNTER — Ambulatory Visit: Admitting: *Deleted

## 2024-12-08 VITALS — BP 109/74 | HR 96

## 2024-12-08 DIAGNOSIS — Z3A34 34 weeks gestation of pregnancy: Secondary | ICD-10-CM

## 2024-12-08 DIAGNOSIS — O24415 Gestational diabetes mellitus in pregnancy, controlled by oral hypoglycemic drugs: Secondary | ICD-10-CM

## 2024-12-08 DIAGNOSIS — O0990 Supervision of high risk pregnancy, unspecified, unspecified trimester: Secondary | ICD-10-CM

## 2024-12-08 DIAGNOSIS — O99213 Obesity complicating pregnancy, third trimester: Secondary | ICD-10-CM | POA: Diagnosis not present

## 2024-12-08 NOTE — Progress Notes (Signed)
" ° °  NURSE VISIT- NST  SUBJECTIVE:  Nancy Bishop is a 24 y.o. G64P1001 female at [redacted]w[redacted]d, here for a NST for pregnancy complicated by Diabetes: A2/BDM and BMI >40.  She reports active fetal movement, contractions: none, vaginal bleeding: none, membranes: intact.   OBJECTIVE:  BP 109/74   Pulse 96   LMP 04/14/2024 (Exact Date)   Appears well, no apparent distress  No results found for this or any previous visit (from the past 24 hours).  NST: FHR baseline 145 bpm, Variability: moderate, Accelerations:present, Decelerations:  Absent= Cat 1/reactive Toco: none   ASSESSMENT: G2P1001 at [redacted]w[redacted]d with Diabetes: A2/BDM and Morbid obesity (BMI >=40) NST reactive  PLAN: EFM strip reviewed by Dr. Ozan   Recommendations: keep next appointment as scheduled    Charolette Bultman  12/08/2024 3:19 PM  "

## 2024-12-10 ENCOUNTER — Other Ambulatory Visit

## 2024-12-10 ENCOUNTER — Ambulatory Visit (INDEPENDENT_AMBULATORY_CARE_PROVIDER_SITE_OTHER): Admitting: Obstetrics & Gynecology

## 2024-12-10 VITALS — BP 112/73 | HR 85 | Wt 246.0 lb

## 2024-12-10 DIAGNOSIS — O24319 Unspecified pre-existing diabetes mellitus in pregnancy, unspecified trimester: Secondary | ICD-10-CM

## 2024-12-10 DIAGNOSIS — Z3A34 34 weeks gestation of pregnancy: Secondary | ICD-10-CM

## 2024-12-10 DIAGNOSIS — O24419 Gestational diabetes mellitus in pregnancy, unspecified control: Secondary | ICD-10-CM | POA: Diagnosis not present

## 2024-12-10 DIAGNOSIS — O0990 Supervision of high risk pregnancy, unspecified, unspecified trimester: Secondary | ICD-10-CM

## 2024-12-10 DIAGNOSIS — O24415 Gestational diabetes mellitus in pregnancy, controlled by oral hypoglycemic drugs: Secondary | ICD-10-CM | POA: Diagnosis not present

## 2024-12-10 NOTE — Progress Notes (Signed)
 "  HIGH-RISK PREGNANCY VISIT Patient name: Nancy Bishop MRN [id number]  Date of birth: 2001-06-01 Chief Complaint:   Routine Prenatal Visit  History of Present Illness:   Nancy Bishop is a 24 y.o. G36P1001 female at [redacted]w[redacted]d with an Estimated Date of Delivery: 01/19/25 being seen today for ongoing management of a high-risk pregnancy complicated by:  -GDMA2 On metformin  500 mg a.m. and 1000 mg p.m. Sugars well-controlled, log reviewed -Obesity -Anxiety/Depression- no meds  Today she reports no complaints.   Contractions: Irritability. Vag. Bleeding: None.  Movement: Present. denies leaking of fluid.      09/23/2024    1:53 PM 07/01/2024    3:57 PM 05/05/2024    8:42 AM 03/12/2024    4:03 PM 01/29/2024    1:13 PM  Depression screen PHQ 2/9  Decreased Interest 1 1 1  0 0  Down, Depressed, Hopeless 0 0 1 0 0  PHQ - 2 Score 1 1 2  0 0  Altered sleeping 1 1 1 2 1   Tired, decreased energy 1 1 1 1 1   Change in appetite 1 1 1 1 1   Feeling bad or failure about yourself  0 0 0 0 0  Trouble concentrating 1 1 1 1 1   Moving slowly or fidgety/restless 0 0 0 0 0  Suicidal thoughts 0 0 0 0 0  PHQ-9 Score 5 5  6  5  4    Difficult doing work/chores Somewhat difficult  Not difficult at all Not difficult at all Not difficult at all     Data saved with a previous flowsheet row definition     Current Outpatient Medications  Medication Instructions   Accu-Chek Softclix Lancets lancets Check blood sugar four times daily   aspirin  162 mg, Oral, Daily   Blood Glucose Monitoring Suppl (ACCU-CHEK GUIDE ME) w/Device KIT 1 kit, Does not apply, As directed, Check blood sugar four times daily   Continuous Glucose Sensor (DEXCOM G7 SENSOR) MISC USE TO MONITOR BLOOD SUGAR CONTINUOUSLY. CHANGE SENSOR EVERY 10 DAYS.   cyclobenzaprine  (FLEXERIL ) 10 mg, Oral, Every 8 hours PRN   glucose blood (ACCU-CHEK GUIDE TEST) test strip Check blood sugar four time daily   metFORMIN  (GLUCOPHAGE ) 500 MG tablet Take 500mg   (1 tablet) in the morning and 1,000mg  (2 tablets) at bedtime   NON FORMULARY Beef organ daily   Prenatal Vit-Fe Fumarate-FA (PRENATAL VITAMIN PO) Take by mouth.   triamcinolone  ointment (KENALOG ) 0.5 % 1 Application, Topical, 2 times daily PRN     Review of Systems:   Pertinent items are noted in HPI Denies abnormal vaginal discharge w/ itching/odor/irritation, headaches, visual changes, shortness of breath, chest pain, abdominal pain, severe nausea/vomiting, or problems with urination or bowel movements unless otherwise stated above. Pertinent History Reviewed:  Reviewed past medical,surgical, social, obstetrical and family history.  Reviewed problem list, medications and allergies. Physical Assessment:   Vitals:   12/10/24 1054  BP: 112/73  Pulse: 85  Weight: 246 lb (111.6 kg)  Body mass index is 48.04 kg/m.           Physical Examination:   General appearance: alert, well appearing, and in no distress  Mental status: normal mood, behavior, speech, dress, motor activity, and thought processes  Skin: warm & dry   Extremities:      Cardiovascular: normal heart rate noted  Respiratory: normal respiratory effort, no distress  Abdomen: gravid, soft, non-tender  Pelvic: Cervical exam deferred         Fetal Status:  Movement: Present    Fetal Surveillance Testing today: cephalic,thick anterior placenta gr 3,FHR 139 bpm,AFI 13 cm,BPP 8/8    Chaperone: N/A    No results found for this or any previous visit (from the past 24 hours).   Assessment & Plan:  High-risk pregnancy: G2P1001 at [redacted]w[redacted]d with an Estimated Date of Delivery: 01/19/25   1. [redacted] weeks gestation of pregnancy  2. Supervision of high risk pregnancy, antepartum (Primary) -GBS ~ 36wks with PAP  3. Gestational diabetes mellitus (GDM) in second trimester controlled on oral hypoglycemic drug -stable with current regimen -BPP 8/8 -continue antepartum testing -reviewed IOL pending fetal status   Meds: No orders  of the defined types were placed in this encounter.   Labs/procedures today: BPP  Treatment Plan:  routine OB care and as outlined above  Reviewed: Preterm labor symptoms and general obstetric precautions including but not limited to vaginal bleeding, contractions, leaking of fluid and fetal movement were reviewed in detail with the patient.  All questions were answered. Pt has home bp cuff. Check bp weekly, let us  know if >140/90.   Follow-up: Return for Continue twice weekly/HROB.   Future Appointments  Date Time Provider Department Center  12/14/2024 10:10 AM [facility] NURSE [facility]  12/17/2024 10:00 AM [facility] US  [facility] None  12/17/2024 10:50 AM Cresenzo-Dishmon, Cathlean, CNM [facility]  12/21/2024 10:10 AM [facility] NURSE [facility]  12/24/2024 10:00 AM [facility] US  [facility] None  12/24/2024 10:50 AM Jayne Vonn DEL, MD [facility]  12/28/2024 10:10 AM [facility] NURSE [facility]  12/31/2024 10:00 AM [facility] US  [facility] None  12/31/2024 10:50 AM Kizzie Suzen SAUNDERS, CNM [facility]  01/04/2025 10:10 AM [facility] NURSE [facility]  01/07/2025 10:00 AM [facility] US  [facility] None  01/07/2025 11:10 AM Newton Cathlean, CNM [facility]  01/11/2025  9:50 AM [facility] NURSE [facility]  01/14/2025 10:00 AM [facility] US  [facility] None  01/14/2025 10:50 AM Marilynn Nest, DO [facility]  01/18/2025 10:10 AM [facility] NURSE [facility]    No orders of the defined types were placed in this encounter.   Cebert Dettmann, DO Attending Obstetrician & Gynecologist, [facility] for [facility], [facility]    "

## 2024-12-10 NOTE — Progress Notes (Signed)
 US  34+2 wks,cephalic,thick anterior placenta gr 3,FHR 139 bpm,AFI 13 cm,BPP 8/8

## 2024-12-13 ENCOUNTER — Encounter: Payer: Self-pay | Admitting: *Deleted

## 2024-12-14 ENCOUNTER — Ambulatory Visit

## 2024-12-15 ENCOUNTER — Ambulatory Visit

## 2024-12-15 VITALS — BP 120/80

## 2024-12-15 DIAGNOSIS — O0990 Supervision of high risk pregnancy, unspecified, unspecified trimester: Secondary | ICD-10-CM

## 2024-12-15 DIAGNOSIS — Z3A35 35 weeks gestation of pregnancy: Secondary | ICD-10-CM

## 2024-12-15 DIAGNOSIS — O24415 Gestational diabetes mellitus in pregnancy, controlled by oral hypoglycemic drugs: Secondary | ICD-10-CM

## 2024-12-17 ENCOUNTER — Ambulatory Visit: Admitting: Advanced Practice Midwife

## 2024-12-17 ENCOUNTER — Encounter: Payer: Self-pay | Admitting: Advanced Practice Midwife

## 2024-12-17 ENCOUNTER — Other Ambulatory Visit: Payer: Self-pay | Admitting: Obstetrics & Gynecology

## 2024-12-17 ENCOUNTER — Other Ambulatory Visit

## 2024-12-17 VITALS — BP 124/83 | HR 90 | Wt 245.0 lb

## 2024-12-17 DIAGNOSIS — Z331 Pregnant state, incidental: Secondary | ICD-10-CM

## 2024-12-17 DIAGNOSIS — Z1389 Encounter for screening for other disorder: Secondary | ICD-10-CM

## 2024-12-17 DIAGNOSIS — O24319 Unspecified pre-existing diabetes mellitus in pregnancy, unspecified trimester: Secondary | ICD-10-CM

## 2024-12-17 DIAGNOSIS — O3110X0 Continuing pregnancy after spontaneous abortion of one fetus or more, unspecified trimester, not applicable or unspecified: Secondary | ICD-10-CM

## 2024-12-17 DIAGNOSIS — O24415 Gestational diabetes mellitus in pregnancy, controlled by oral hypoglycemic drugs: Secondary | ICD-10-CM

## 2024-12-17 DIAGNOSIS — Z141 Cystic fibrosis carrier: Secondary | ICD-10-CM

## 2024-12-17 DIAGNOSIS — Z3A35 35 weeks gestation of pregnancy: Secondary | ICD-10-CM

## 2024-12-17 DIAGNOSIS — Z124 Encounter for screening for malignant neoplasm of cervix: Secondary | ICD-10-CM

## 2024-12-17 DIAGNOSIS — O0990 Supervision of high risk pregnancy, unspecified, unspecified trimester: Secondary | ICD-10-CM

## 2024-12-17 DIAGNOSIS — O99213 Obesity complicating pregnancy, third trimester: Secondary | ICD-10-CM

## 2024-12-17 NOTE — Patient Instructions (Addendum)
 OUTPATIENT FOLEY BULB INDUCTION OF LABOR:  Information Sheet for Mothers and Family               Whats a Foley Bulb Induction? A Foley bulb induction is a procedure where your provider inserts a catheter into your cervix. Once inside your womb, your provider inflates the balloon with a saline solution.   This puts pressure on your cervix and encourages dilation. The catheter falls out once your cervix dilates to 3-4 centimeters.     With any procedure, its important that you know what to expect. The insertion of a Foley catheter can be a bit uncomfortable, and some women experience sharp pelvic pain. The pain may subside once the catheter is in place. You may experience some cramping when the Foley catheter is in place.  This is normal.     GO TO THE MATERNITY ADMISSIONS UNIT FOR THE FOLLOWING: Heavy vaginal bleeding Rupture of membranes (fluid that wets your underwear) Painful uterine contractions every 5 minutes or less Severe abdominal discomfort Decreased movement of the baby       Norris Bouche, I greatly value your feedback.  If you receive a survey following your visit with us  today, we appreciate you taking the time to fill it out.  Thanks, Sherrell Ely, DNP, CNM  [facility] HAS MOVED!!! It is now [facility] ([address] [place], [place] [address]) Entrance located off of [address] Free 24/7 valet parking   Go to [email or web address] to register for FREE online childbirth classes    Call the office [phone]) or go to [facility] if: You begin to have strong, frequent contractions Your water breaks.  Sometimes it is a big gush of fluid, sometimes it is just a trickle that keeps getting your panties wet or running down your legs You have vaginal bleeding.  It is normal to have a small amount of spotting if your cervix was checked.  You don't feel your baby moving like normal.  If you don't, get you  something to eat and drink and lay down and focus on feeling your baby move.  You should feel at least 10 movements in 2 hours.  If you don't, you should call the office or go to [facility].   Home Blood Pressure Monitoring for Patients   Your provider has recommended that you check your blood pressure (BP) at least once a week at home. If you do not have a blood pressure cuff at home, one will be provided for you. Contact your provider if you have not received your monitor within 1 week.   Helpful Tips for Accurate Home Blood Pressure Checks  Don't smoke, exercise, or drink caffeine 30 minutes before checking your BP Use the restroom before checking your BP (a full bladder can raise your pressure) Relax in a comfortable upright chair Feet on the ground Left arm resting comfortably on a flat surface at the level of your heart Legs uncrossed Back supported Sit quietly and don't talk Place the cuff on your bare arm Adjust snuggly, so that only two fingertips can fit between your skin and the top of the cuff Check 2 readings separated by at least one minute Keep a log of your BP readings For a visual, please reference this diagram: http://ccnc.care/bpdiagram  Provider Name: [facility]     Phone: [phone]  Zone 1: ALL CLEAR  Continue to monitor your symptoms:  BP reading is less  than 140 (top number) or less than 90 (bottom number)  No right upper stomach pain No headaches or seeing spots No feeling nauseated or throwing up No swelling in face and hands  Zone 2: CAUTION Call your doctor's office for any of the following:  BP reading is greater than 140 (top number) or greater than 90 (bottom number)  Stomach pain under your ribs in the middle or right side Headaches or seeing spots Feeling nauseated or throwing up Swelling in face and hands  Zone 3: EMERGENCY  Seek immediate medical care if you have any of the following:  BP reading is greater than160 (top  number) or greater than 110 (bottom number) Severe headaches not improving with Tylenol  Serious difficulty catching your breath Any worsening symptoms from Zone 2

## 2024-12-17 NOTE — Progress Notes (Signed)
 "   HIGH-RISK PREGNANCY VISIT Patient name: Nancy Bishop MRN [id number]  Date of birth: 03/25/2001 Chief Complaint:   Routine Prenatal Visit and Pregnancy Ultrasound  History of Present Illness:   Nancy Bishop is a 24 y.o. G72P1001 female at [redacted]w[redacted]d with an Estimated Date of Delivery: 01/19/25 being seen today for ongoing management of a high-risk pregnancy complicated by diabetes mellitus A2DM currently on metformin  500/1000 and morbid obesity BMI >=40.    Today she reports FBS <95 and 2hr pp <120no complaints. Contractions: Not present.  .  Movement: Present. denies leaking of fluid.      09/23/2024    1:53 PM 07/01/2024    3:57 PM 05/05/2024    8:42 AM 03/12/2024    4:03 PM 01/29/2024    1:13 PM  Depression screen PHQ 2/9  Decreased Interest 1 1 1  0 0  Down, Depressed, Hopeless 0 0 1 0 0  PHQ - 2 Score 1 1 2  0 0  Altered sleeping 1 1 1 2 1   Tired, decreased energy 1 1 1 1 1   Change in appetite 1 1 1 1 1   Feeling bad or failure about yourself  0 0 0 0 0  Trouble concentrating 1 1 1 1 1   Moving slowly or fidgety/restless 0 0 0 0 0  Suicidal thoughts 0 0 0 0 0  PHQ-9 Score 5 5  6  5  4    Difficult doing work/chores Somewhat difficult  Not difficult at all Not difficult at all Not difficult at all     Data saved with a previous flowsheet row definition        09/23/2024    1:53 PM 07/01/2024    3:57 PM 05/05/2024    8:42 AM 03/12/2024    4:03 PM  GAD 7 : Generalized Anxiety Score  Nervous, Anxious, on Edge 1  1  1   0   Control/stop worrying 1  1  1   0   Worry too much - different things 1  1  1  1    Trouble relaxing 2  1  1  1    Restless 2  1  1  1    Easily annoyed or irritable 1  1  2  1    Afraid - awful might happen 1  1  1   0   Total GAD 7 Score 9 7 8 4   Anxiety Difficulty Somewhat difficult  Not difficult at all Not difficult at all     Data saved with a previous flowsheet row definition     Review of Systems:   Pertinent items are noted in HPI Denies abnormal  vaginal discharge w/ itching/odor/irritation, headaches, visual changes, shortness of breath, chest pain, abdominal pain, severe nausea/vomiting, or problems with urination or bowel movements unless otherwise stated above. Pertinent History Reviewed:  Reviewed past medical,surgical, social, obstetrical and family history.  Reviewed problem list, medications and allergies. Physical Assessment:   Vitals:   12/17/24 1054  BP: 124/83  Pulse: 90  Weight: 245 lb (111.1 kg)  Body mass index is 47.85 kg/m.           Physical Examination:   General appearance: alert, well appearing, and in no distress  Mental status: alert, oriented to person, place, and time  Skin: warm & dry   Extremities: Edema: None    Cardiovascular: normal heart rate noted  Respiratory: normal respiratory effort, no distress  Abdomen: gravid, soft, non-tender  Pelvic: Dilation: 1 Effacement (%): 50 Station: -4 (Ballotable)  Chaperone: N/A    Fetal Status:     Movement: Present Presentation: Vertex  Fetal Surveillance Testing today:   US  35+2 wks,cephalic,thickened anterior placenta 8.5 cm gr 3,FHR 151 bpm,BPP 8/8,AFI 14 cm,EFW 2204 g 10%,AC 12%,FL .3%,RI .63,.70,.66=85%       No results found for this or any previous visit (from the past 24 hours).  Assessment & Plan:  High-risk pregnancy: G2P1001 at [redacted]w[redacted]d with an Estimated Date of Delivery: 01/19/25   1. Supervision of high risk pregnancy, antepartum (Primary)   2. [redacted] weeks gestation of pregnancy   3. Gestational diabetes mellitus (GDM) in third trimester controlled on oral hypoglycemic drug Well controlled, continue current regimen. Discussed delivery timing at length w/pt and Dr. Ozan, given that EFW is 10% today.  As everything is still normal, Plan IOL 38 weeks  4. Maternal obesity syndrome in third trimester Testing per DM    Meds: No orders of the defined types were placed in this encounter.   Orders:  Orders Placed This Encounter   Procedures   Culture, beta strep (group b only)     Labs/procedures today: U/S, GBS, GC, CHL, pap   Reviewed: Preterm labor symptoms and general obstetric precautions including but not limited to vaginal bleeding, contractions, leaking of fluid and fetal movement were reviewed in detail with the patient.  All questions were answered. Does have home bp cuff. Office bp cuff given: not applicable. Check bp weekly, let us  know if consistently >140 and/or >90.  Follow-up: Return for change providers to [facility] and [facility] only per ozan.   Future Appointments  Date Time Provider Department Center  12/21/2024 10:10 AM [facility] NURSE [facility]  12/24/2024 10:00 AM [facility] US  [facility] None  12/24/2024 10:50 AM Jayne Vonn DEL, MD [facility]  12/28/2024 10:10 AM [facility] NURSE [facility]  12/31/2024 10:00 AM [facility] US  [facility] None  12/31/2024 10:50 AM Kizzie Suzen SAUNDERS, CNM [facility]  01/04/2025 10:10 AM [facility] NURSE [facility]  01/07/2025 10:00 AM [facility] US  [facility] None  01/07/2025 11:10 AM Newton Mering, CNM [facility]  01/11/2025  9:50 AM [facility] NURSE [facility]  01/14/2025 10:00 AM [facility] US  [facility] None  01/14/2025 10:50 AM Marilynn Nest, DO [facility]  01/18/2025 10:10 AM [facility] NURSE [facility]    Orders Placed This Encounter  Procedures   Culture, beta strep (group b only)   Mering Newton , DNP, CNM [facility] 12/17/2024 11:43 AM  "

## 2024-12-17 NOTE — Progress Notes (Signed)
 US  35+2 wks,cephalic,thickened anterior placenta 8.5 cm gr 3,FHR 151 bpm,BPP 8/8,AFI 14 cm,EFW 2204 g 10%,AC 12%,FL .3%,RI .63,.70,.66=85%

## 2024-12-18 LAB — CYTOLOGY - PAP
Chlamydia: NEGATIVE
Comment: NEGATIVE
Comment: NEGATIVE
Comment: NORMAL
Diagnosis: NEGATIVE
High risk HPV: NEGATIVE
Neisseria Gonorrhea: NEGATIVE

## 2024-12-21 ENCOUNTER — Other Ambulatory Visit

## 2024-12-24 ENCOUNTER — Other Ambulatory Visit

## 2024-12-24 ENCOUNTER — Encounter: Admitting: Obstetrics & Gynecology

## 2024-12-28 ENCOUNTER — Other Ambulatory Visit

## 2024-12-31 ENCOUNTER — Encounter: Admitting: Obstetrics & Gynecology

## 2024-12-31 ENCOUNTER — Other Ambulatory Visit

## 2025-01-04 ENCOUNTER — Other Ambulatory Visit

## 2025-01-07 ENCOUNTER — Other Ambulatory Visit

## 2025-01-07 ENCOUNTER — Encounter: Admitting: Obstetrics & Gynecology

## 2025-01-11 ENCOUNTER — Other Ambulatory Visit

## 2025-01-14 ENCOUNTER — Other Ambulatory Visit

## 2025-01-14 ENCOUNTER — Encounter: Admitting: Obstetrics & Gynecology

## 2025-01-18 ENCOUNTER — Other Ambulatory Visit
